# Patient Record
Sex: Female | Born: 1955
Health system: Southern US, Community
[De-identification: ages and names within clinical notes are randomized; demographics above are authoritative.]

## PROBLEM LIST (undated history)

## (undated) DIAGNOSIS — F329 Major depressive disorder, single episode, unspecified: Secondary | ICD-10-CM

## (undated) DIAGNOSIS — G4700 Insomnia, unspecified: Secondary | ICD-10-CM

## (undated) DIAGNOSIS — R7303 Prediabetes: Secondary | ICD-10-CM

## (undated) DIAGNOSIS — F419 Anxiety disorder, unspecified: Secondary | ICD-10-CM

## (undated) DIAGNOSIS — Z972 Presence of dental prosthetic device (complete) (partial): Secondary | ICD-10-CM

## (undated) DIAGNOSIS — U071 COVID-19: Secondary | ICD-10-CM

## (undated) DIAGNOSIS — D573 Sickle-cell trait: Secondary | ICD-10-CM

## (undated) DIAGNOSIS — E049 Nontoxic goiter, unspecified: Secondary | ICD-10-CM

## (undated) DIAGNOSIS — N183 Chronic kidney disease, stage 3 unspecified: Secondary | ICD-10-CM

## (undated) DIAGNOSIS — D126 Benign neoplasm of colon, unspecified: Secondary | ICD-10-CM

## (undated) DIAGNOSIS — Z973 Presence of spectacles and contact lenses: Secondary | ICD-10-CM

## (undated) DIAGNOSIS — D500 Iron deficiency anemia secondary to blood loss (chronic): Secondary | ICD-10-CM

## (undated) DIAGNOSIS — I10 Essential (primary) hypertension: Secondary | ICD-10-CM

## (undated) DIAGNOSIS — F32A Depression, unspecified: Secondary | ICD-10-CM

## (undated) DIAGNOSIS — D649 Anemia, unspecified: Secondary | ICD-10-CM

## (undated) HISTORY — DX: Iron deficiency anemia secondary to blood loss (chronic): D50.0

## (undated) HISTORY — DX: Sickle-cell trait: D57.3

## (undated) HISTORY — DX: COVID-19: U07.1

## (undated) HISTORY — DX: Depression, unspecified: F32.A

## (undated) HISTORY — PX: EYE SURGERY: SHX253

## (undated) HISTORY — PX: ABDOMINAL HYSTERECTOMY: SHX81

---
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
## 2005-06-17 ENCOUNTER — Ambulatory Visit: Payer: Self-pay

## 2005-07-05 ENCOUNTER — Ambulatory Visit: Payer: Self-pay

## 2006-10-15 ENCOUNTER — Ambulatory Visit: Payer: Self-pay

## 2008-02-03 ENCOUNTER — Ambulatory Visit: Payer: Self-pay

## 2009-03-02 ENCOUNTER — Ambulatory Visit: Payer: Self-pay | Admitting: Gastroenterology

## 2009-03-08 ENCOUNTER — Ambulatory Visit: Payer: Self-pay

## 2010-01-26 HISTORY — PX: ABDOMINAL HYSTERECTOMY: SHX81

## 2010-04-18 ENCOUNTER — Ambulatory Visit: Payer: Self-pay

## 2011-09-03 ENCOUNTER — Ambulatory Visit: Payer: Self-pay

## 2013-09-04 ENCOUNTER — Ambulatory Visit: Payer: Self-pay | Admitting: Internal Medicine

## 2013-09-04 LAB — COMPREHENSIVE METABOLIC PANEL
Albumin: 4.1 g/dL (ref 3.4–5.0)
Alkaline Phosphatase: 169 U/L — ABNORMAL HIGH (ref 50–136)
Anion Gap: 8 (ref 7–16)
BUN: 23 mg/dL — ABNORMAL HIGH (ref 7–18)
Bilirubin,Total: 0.4 mg/dL (ref 0.2–1.0)
Calcium, Total: 10 mg/dL (ref 8.5–10.1)
Chloride: 103 mmol/L (ref 98–107)
Co2: 31 mmol/L (ref 21–32)
Creatinine: 1.28 mg/dL (ref 0.60–1.30)
EGFR (African American): 54 — ABNORMAL LOW
EGFR (Non-African Amer.): 47 — ABNORMAL LOW
Glucose: 102 mg/dL — ABNORMAL HIGH (ref 65–99)
Osmolality: 287 (ref 275–301)
Potassium: 3.5 mmol/L (ref 3.5–5.1)
SGOT(AST): 19 U/L (ref 15–37)
SGPT (ALT): 28 U/L (ref 12–78)
Sodium: 142 mmol/L (ref 136–145)
Total Protein: 9.1 g/dL — ABNORMAL HIGH (ref 6.4–8.2)

## 2013-09-04 LAB — URINALYSIS, COMPLETE
Bilirubin,UR: NEGATIVE
Glucose,UR: NEGATIVE mg/dL (ref 0–75)
Ketone: NEGATIVE
Leukocyte Esterase: NEGATIVE
Nitrite: POSITIVE
Ph: 6 (ref 4.5–8.0)
Protein: NEGATIVE
Specific Gravity: 1.01 (ref 1.003–1.030)

## 2013-09-04 LAB — CBC WITH DIFFERENTIAL/PLATELET
Basophil #: 0.1 10*3/uL (ref 0.0–0.1)
Basophil %: 0.8 %
Eosinophil #: 0.5 10*3/uL (ref 0.0–0.7)
Eosinophil %: 6.5 %
HCT: 36.7 % (ref 35.0–47.0)
HGB: 12.2 g/dL (ref 12.0–16.0)
Lymphocyte #: 1.8 10*3/uL (ref 1.0–3.6)
Lymphocyte %: 26.1 %
MCH: 28.9 pg (ref 26.0–34.0)
MCHC: 33.4 g/dL (ref 32.0–36.0)
MCV: 87 fL (ref 80–100)
Monocyte #: 0.4 x10 3/mm (ref 0.2–0.9)
Monocyte %: 5.8 %
Neutrophil #: 4.2 10*3/uL (ref 1.4–6.5)
Neutrophil %: 60.8 %
Platelet: 302 10*3/uL (ref 150–440)
RBC: 4.24 10*6/uL (ref 3.80–5.20)
RDW: 14.6 % — ABNORMAL HIGH (ref 11.5–14.5)
WBC: 7 10*3/uL (ref 3.6–11.0)

## 2013-09-06 LAB — URINE CULTURE

## 2014-02-08 ENCOUNTER — Ambulatory Visit: Payer: Self-pay

## 2015-02-14 ENCOUNTER — Ambulatory Visit: Payer: Self-pay | Admitting: Family Medicine

## 2015-05-17 ENCOUNTER — Other Ambulatory Visit: Payer: Self-pay | Admitting: Family Medicine

## 2015-05-17 DIAGNOSIS — R2232 Localized swelling, mass and lump, left upper limb: Secondary | ICD-10-CM

## 2017-03-18 ENCOUNTER — Other Ambulatory Visit: Payer: Self-pay | Admitting: Family Medicine

## 2017-03-18 DIAGNOSIS — Z1239 Encounter for other screening for malignant neoplasm of breast: Secondary | ICD-10-CM

## 2017-07-07 ENCOUNTER — Other Ambulatory Visit: Payer: Self-pay | Admitting: Family Medicine

## 2017-07-09 ENCOUNTER — Other Ambulatory Visit: Payer: Self-pay | Admitting: Obstetrics

## 2017-07-09 DIAGNOSIS — Z1231 Encounter for screening mammogram for malignant neoplasm of breast: Secondary | ICD-10-CM

## 2017-07-11 ENCOUNTER — Other Ambulatory Visit: Payer: Self-pay | Admitting: Obstetrics

## 2017-07-11 DIAGNOSIS — E010 Iodine-deficiency related diffuse (endemic) goiter: Secondary | ICD-10-CM

## 2017-07-14 ENCOUNTER — Ambulatory Visit
Admission: RE | Admit: 2017-07-14 | Discharge: 2017-07-14 | Disposition: A | Discharge status: Home / Self Care | Payer: Federal, State, Local not specified - PPO | Source: Ambulatory Visit | Attending: Obstetrics | Admitting: Obstetrics

## 2017-07-14 ENCOUNTER — Encounter (INDEPENDENT_AMBULATORY_CARE_PROVIDER_SITE_OTHER): Payer: Self-pay

## 2017-07-14 DIAGNOSIS — Z1231 Encounter for screening mammogram for malignant neoplasm of breast: Secondary | ICD-10-CM | POA: Diagnosis present

## 2017-07-15 ENCOUNTER — Other Ambulatory Visit: Payer: Self-pay | Admitting: Obstetrics

## 2017-07-15 DIAGNOSIS — R928 Other abnormal and inconclusive findings on diagnostic imaging of breast: Secondary | ICD-10-CM

## 2017-07-15 DIAGNOSIS — N6310 Unspecified lump in the right breast, unspecified quadrant: Secondary | ICD-10-CM

## 2017-07-16 ENCOUNTER — Ambulatory Visit
Admission: RE | Admit: 2017-07-16 | Discharge: 2017-07-16 | Disposition: A | Discharge status: Home / Self Care | Payer: Federal, State, Local not specified - PPO | Source: Ambulatory Visit | Attending: Obstetrics | Admitting: Obstetrics

## 2017-07-16 DIAGNOSIS — E042 Nontoxic multinodular goiter: Secondary | ICD-10-CM | POA: Diagnosis not present

## 2017-07-16 DIAGNOSIS — E010 Iodine-deficiency related diffuse (endemic) goiter: Secondary | ICD-10-CM

## 2017-07-29 ENCOUNTER — Ambulatory Visit
Admission: RE | Admit: 2017-07-29 | Discharge: 2017-07-29 | Disposition: A | Discharge status: Home / Self Care | Payer: Federal, State, Local not specified - PPO | Source: Ambulatory Visit | Attending: Obstetrics | Admitting: Obstetrics

## 2017-07-29 DIAGNOSIS — R928 Other abnormal and inconclusive findings on diagnostic imaging of breast: Secondary | ICD-10-CM

## 2017-07-29 DIAGNOSIS — N6314 Unspecified lump in the right breast, lower inner quadrant: Secondary | ICD-10-CM | POA: Insufficient documentation

## 2017-07-29 DIAGNOSIS — N6310 Unspecified lump in the right breast, unspecified quadrant: Secondary | ICD-10-CM

## 2017-12-29 ENCOUNTER — Other Ambulatory Visit: Payer: Self-pay | Admitting: Family Medicine

## 2017-12-29 DIAGNOSIS — R928 Other abnormal and inconclusive findings on diagnostic imaging of breast: Secondary | ICD-10-CM

## 2018-01-30 ENCOUNTER — Ambulatory Visit
Admission: RE | Admit: 2018-01-30 | Discharge: 2018-01-30 | Disposition: A | Discharge status: Home / Self Care | Payer: Federal, State, Local not specified - PPO | Source: Ambulatory Visit | Attending: Family Medicine | Admitting: Family Medicine

## 2018-01-30 DIAGNOSIS — N6001 Solitary cyst of right breast: Secondary | ICD-10-CM | POA: Insufficient documentation

## 2018-01-30 DIAGNOSIS — R928 Other abnormal and inconclusive findings on diagnostic imaging of breast: Secondary | ICD-10-CM

## 2018-06-22 DIAGNOSIS — N390 Urinary tract infection, site not specified: Secondary | ICD-10-CM | POA: Diagnosis not present

## 2018-08-14 DIAGNOSIS — H10811 Pingueculitis, right eye: Secondary | ICD-10-CM | POA: Diagnosis not present

## 2018-08-31 DIAGNOSIS — R7989 Other specified abnormal findings of blood chemistry: Secondary | ICD-10-CM | POA: Diagnosis not present

## 2018-08-31 DIAGNOSIS — E7800 Pure hypercholesterolemia, unspecified: Secondary | ICD-10-CM | POA: Diagnosis not present

## 2018-08-31 DIAGNOSIS — I10 Essential (primary) hypertension: Secondary | ICD-10-CM | POA: Diagnosis not present

## 2018-08-31 DIAGNOSIS — E559 Vitamin D deficiency, unspecified: Secondary | ICD-10-CM | POA: Diagnosis not present

## 2018-08-31 DIAGNOSIS — Z713 Dietary counseling and surveillance: Secondary | ICD-10-CM | POA: Diagnosis not present

## 2018-08-31 DIAGNOSIS — R946 Abnormal results of thyroid function studies: Secondary | ICD-10-CM | POA: Diagnosis not present

## 2018-09-08 DIAGNOSIS — N183 Chronic kidney disease, stage 3 (moderate): Secondary | ICD-10-CM | POA: Diagnosis not present

## 2018-09-08 DIAGNOSIS — D649 Anemia, unspecified: Secondary | ICD-10-CM | POA: Diagnosis not present

## 2018-09-08 DIAGNOSIS — E7800 Pure hypercholesterolemia, unspecified: Secondary | ICD-10-CM | POA: Diagnosis not present

## 2018-09-08 DIAGNOSIS — I10 Essential (primary) hypertension: Secondary | ICD-10-CM | POA: Diagnosis not present

## 2018-09-11 DIAGNOSIS — Z713 Dietary counseling and surveillance: Secondary | ICD-10-CM | POA: Diagnosis not present

## 2018-09-11 DIAGNOSIS — Z131 Encounter for screening for diabetes mellitus: Secondary | ICD-10-CM | POA: Diagnosis not present

## 2018-09-14 DIAGNOSIS — E785 Hyperlipidemia, unspecified: Secondary | ICD-10-CM | POA: Diagnosis not present

## 2018-09-14 DIAGNOSIS — I10 Essential (primary) hypertension: Secondary | ICD-10-CM | POA: Diagnosis not present

## 2018-09-14 DIAGNOSIS — E669 Obesity, unspecified: Secondary | ICD-10-CM | POA: Diagnosis not present

## 2018-09-23 DIAGNOSIS — H2513 Age-related nuclear cataract, bilateral: Secondary | ICD-10-CM | POA: Diagnosis not present

## 2018-09-23 DIAGNOSIS — H11053 Peripheral pterygium, progressive, bilateral: Secondary | ICD-10-CM | POA: Diagnosis not present

## 2018-09-23 DIAGNOSIS — H52213 Irregular astigmatism, bilateral: Secondary | ICD-10-CM | POA: Diagnosis not present

## 2018-09-30 ENCOUNTER — Encounter: Payer: Self-pay | Admitting: Emergency Medicine

## 2018-09-30 ENCOUNTER — Ambulatory Visit
Admission: EM | Admit: 2018-09-30 | Discharge: 2018-09-30 | Disposition: A | Discharge status: Home / Self Care | Payer: Federal, State, Local not specified - PPO | Attending: Family Medicine | Admitting: Family Medicine

## 2018-09-30 ENCOUNTER — Other Ambulatory Visit: Payer: Self-pay

## 2018-09-30 DIAGNOSIS — J0100 Acute maxillary sinusitis, unspecified: Secondary | ICD-10-CM | POA: Diagnosis not present

## 2018-09-30 DIAGNOSIS — R05 Cough: Secondary | ICD-10-CM | POA: Diagnosis not present

## 2018-09-30 DIAGNOSIS — R059 Cough, unspecified: Secondary | ICD-10-CM

## 2018-09-30 HISTORY — DX: Essential (primary) hypertension: I10

## 2018-09-30 MED ORDER — AMOXICILLIN 875 MG PO TABS: 875.0000 mg | ORAL_TABLET | Freq: Two times a day (BID) | ORAL | 0 refills | Status: DC

## 2018-09-30 MED ORDER — HYDROCOD POLST-CPM POLST ER 10-8 MG/5ML PO SUER: 5.0000 mL | Freq: Every evening | ORAL | 0 refills | Status: DC | PRN

## 2018-09-30 NOTE — ED Triage Notes (Signed)
Patient c/o cough and chest congestion for over a week. Patient reports HA today.

## 2018-09-30 NOTE — ED Provider Notes (Signed)
[facility]    CSN: [id number] Arrival date & time: 09/30/18  1457     History   Chief Complaint Chief Complaint  Patient presents with  . Cough  . Headache    HPI Kendra Rhodes is a 62 y.o. female.   The history is provided by the patient.  Cough  Associated symptoms: headaches   Associated symptoms: no wheezing   Headache  Associated symptoms: congestion, cough, fatigue and URI   URI  Presenting symptoms: congestion, cough and fatigue   Severity:  Moderate Onset quality:  Sudden Duration:  10 days Timing:  Constant Progression:  Worsening Chronicity:  New Relieved by:  Nothing Ineffective treatments:  OTC medications Associated symptoms: headaches   Associated symptoms: no wheezing   Risk factors: sick contacts   Risk factors: not elderly, no chronic cardiac disease, no chronic kidney disease, no chronic respiratory disease, no diabetes mellitus, no immunosuppression, no recent illness and no recent travel     Past Medical History:  Diagnosis Date  . Hypertension     There are no active problems to display for this patient.   Past Surgical History:  Procedure Laterality Date  . ABDOMINAL HYSTERECTOMY      OB History   None      Home Medications    Prior to Admission medications   Medication Sig Start Date End Date Taking? Authorizing Provider  atenolol (TENORMIN) 25 MG tablet atenolol 25 mg tablet 01/10/16  Yes [provider]  atorvastatin (LIPITOR) 40 MG tablet  12/03/17  Yes [provider]  azelastine (ASTELIN) 0.1 % nasal spray Place into the nose. 01/30/16  Yes [provider]  hydrochlorothiazide (HYDRODIURIL) 25 MG tablet hydrochlorothiazide 25 mg tablet 02/19/16  Yes [provider]  lisinopril (PRINIVIL,ZESTRIL) 10 MG tablet lisinopril 10 mg tablet 12/03/17  Yes [provider]  montelukast (SINGULAIR) 10 MG tablet montelukast 10 mg tablet   Yes [provider]  TRULANCE 3  MG TABS  09/10/18  Yes [provider]  amoxicillin (AMOXIL) 875 MG tablet Take 1 tablet (875 mg total) by mouth 2 (two) times daily. 09/30/18   Norval Gable, MD  chlorpheniramine-HYDROcodone (TUSSIONEX PENNKINETIC ER) 10-8 MG/5ML SUER Take 5 mLs by mouth at bedtime as needed. 09/30/18   Norval Gable, MD  citalopram (CELEXA) 20 MG tablet citalopram 20 mg tablet    [provider]    Family History Family History  Problem Relation Age of Onset  . Breast cancer Mother 29    Social History Social History   Tobacco Use  . Smoking status: Never Smoker  . Smokeless tobacco: Never Used  Substance Use Topics  . Alcohol use: Never    Frequency: Never  . Drug use: Never     Allergies   Aspirin and Sulfa antibiotics   Review of Systems Review of Systems  Constitutional: Positive for fatigue.  HENT: Positive for congestion.   Respiratory: Positive for cough. Negative for wheezing.   Neurological: Positive for headaches.     Physical Exam Triage Vital Signs ED Triage Vitals  Enc Vitals Group     BP 09/30/18 1513 123/88     Pulse Rate 09/30/18 1513 100     Resp 09/30/18 1513 16     Temp 09/30/18 1513 98.6 F (37 C)     Temp Source 09/30/18 1513 Oral     SpO2 09/30/18 1513 99 %     Weight 09/30/18 1507 188 lb (85.3 kg)  Height 09/30/18 1507 5\' 6"  (1.676 m)     Head Circumference --      Peak Flow --      Pain Score 09/30/18 1507 6     Pain Loc --      Pain Edu? --      Excl. in [place]? --    No data found.  Updated Vital Signs BP 123/88 (BP Location: Left Arm)   Pulse 100   Temp 98.6 F (37 C) (Oral)   Resp 16   Ht 5\' 6"  (1.676 m)   Wt 85.3 kg   SpO2 99%   BMI 30.34 kg/m   Visual Acuity Right Eye Distance:   Left Eye Distance:   Bilateral Distance:    Right Eye Near:   Left Eye Near:    Bilateral Near:     Physical Exam  Constitutional: She appears well-developed and well-nourished. No distress.  HENT:  Head: Normocephalic and  atraumatic.  Right Ear: Tympanic membrane, external ear and ear canal normal.  Left Ear: Tympanic membrane, external ear and ear canal normal.  Nose: Mucosal edema and rhinorrhea present. No nose lacerations, sinus tenderness, nasal deformity, septal deviation or nasal septal hematoma. No epistaxis.  No foreign bodies. Right sinus exhibits maxillary sinus tenderness and frontal sinus tenderness. Left sinus exhibits maxillary sinus tenderness and frontal sinus tenderness.  Mouth/Throat: Uvula is midline, oropharynx is clear and moist and mucous membranes are normal. No oropharyngeal exudate.  Eyes: Pupils are equal, round, and reactive to light. Conjunctivae and EOM are normal. Right eye exhibits no discharge. Left eye exhibits no discharge. No scleral icterus.  Neck: Normal range of motion. Neck supple. No thyromegaly present.  Cardiovascular: Normal rate, regular rhythm and normal heart sounds.  Pulmonary/Chest: Effort normal and breath sounds normal. No stridor. No respiratory distress. She has no wheezes. She has no rales.  Lymphadenopathy:    She has no cervical adenopathy.  Skin: She is not diaphoretic.  Nursing note and vitals reviewed.    UC Treatments / Results  Labs (all labs ordered are listed, but only abnormal results are displayed) Labs Reviewed - No data to display  EKG None  Radiology No results found.  Procedures Procedures (including critical care time)  Medications Ordered in UC Medications - No data to display  Initial Impression / Assessment and Plan / UC Course  I have reviewed the triage vital signs and the nursing notes.  Pertinent labs & imaging results that were available during my care of the patient were reviewed by me and considered in my medical decision making (see chart for details).      Final Clinical Impressions(s) / UC Diagnoses   Final diagnoses:  Acute maxillary sinusitis, recurrence not specified  Cough     Discharge Instructions      Over the counter flonase nasal spray    ED Prescriptions    Medication Sig Dispense Auth. Provider   amoxicillin (AMOXIL) 875 MG tablet Take 1 tablet (875 mg total) by mouth 2 (two) times daily. 20 tablet Norval Gable, MD   chlorpheniramine-HYDROcodone (TUSSIONEX PENNKINETIC ER) 10-8 MG/5ML SUER Take 5 mLs by mouth at bedtime as needed. 60 mL Norval Gable, MD     1. diagnosis reviewed with patient 2. rx as per orders above; reviewed possible side effects, interactions, risks and benefits  3. Recommend supportive treatment as above  4. Follow-up prn if symptoms worsen or don't improve   Controlled Substance Prescriptions [place] Controlled Substance Registry consulted? Not Applicable  Norval Gable, MD 09/30/18 [phone]

## 2018-09-30 NOTE — Discharge Instructions (Signed)
Over the counter flonase nasal spray

## 2018-10-05 DIAGNOSIS — Z713 Dietary counseling and surveillance: Secondary | ICD-10-CM | POA: Diagnosis not present

## 2018-10-12 DIAGNOSIS — H11001 Unspecified pterygium of right eye: Secondary | ICD-10-CM | POA: Diagnosis not present

## 2018-10-12 DIAGNOSIS — H11051 Peripheral pterygium, progressive, right eye: Secondary | ICD-10-CM | POA: Diagnosis not present

## 2018-10-12 DIAGNOSIS — Z882 Allergy status to sulfonamides status: Secondary | ICD-10-CM | POA: Diagnosis not present

## 2018-10-12 DIAGNOSIS — H11053 Peripheral pterygium, progressive, bilateral: Secondary | ICD-10-CM | POA: Diagnosis not present

## 2018-10-12 DIAGNOSIS — I10 Essential (primary) hypertension: Secondary | ICD-10-CM | POA: Diagnosis not present

## 2018-10-12 DIAGNOSIS — Z888 Allergy status to other drugs, medicaments and biological substances status: Secondary | ICD-10-CM | POA: Diagnosis not present

## 2018-10-12 DIAGNOSIS — D573 Sickle-cell trait: Secondary | ICD-10-CM | POA: Diagnosis not present

## 2018-10-12 DIAGNOSIS — Z79899 Other long term (current) drug therapy: Secondary | ICD-10-CM | POA: Diagnosis not present

## 2018-10-20 DIAGNOSIS — Z713 Dietary counseling and surveillance: Secondary | ICD-10-CM | POA: Diagnosis not present

## 2018-12-02 DIAGNOSIS — H11053 Peripheral pterygium, progressive, bilateral: Secondary | ICD-10-CM | POA: Diagnosis not present

## 2018-12-02 DIAGNOSIS — H2513 Age-related nuclear cataract, bilateral: Secondary | ICD-10-CM | POA: Diagnosis not present

## 2018-12-25 DIAGNOSIS — E7800 Pure hypercholesterolemia, unspecified: Secondary | ICD-10-CM | POA: Diagnosis not present

## 2018-12-25 DIAGNOSIS — D649 Anemia, unspecified: Secondary | ICD-10-CM | POA: Diagnosis not present

## 2018-12-25 DIAGNOSIS — R7989 Other specified abnormal findings of blood chemistry: Secondary | ICD-10-CM | POA: Diagnosis not present

## 2018-12-25 DIAGNOSIS — I10 Essential (primary) hypertension: Secondary | ICD-10-CM | POA: Diagnosis not present

## 2018-12-25 DIAGNOSIS — E559 Vitamin D deficiency, unspecified: Secondary | ICD-10-CM | POA: Diagnosis not present

## 2018-12-28 DIAGNOSIS — I10 Essential (primary) hypertension: Secondary | ICD-10-CM | POA: Diagnosis not present

## 2018-12-28 DIAGNOSIS — E7800 Pure hypercholesterolemia, unspecified: Secondary | ICD-10-CM | POA: Diagnosis not present

## 2018-12-28 DIAGNOSIS — N183 Chronic kidney disease, stage 3 (moderate): Secondary | ICD-10-CM | POA: Diagnosis not present

## 2018-12-28 DIAGNOSIS — D649 Anemia, unspecified: Secondary | ICD-10-CM | POA: Diagnosis not present

## 2018-12-31 ENCOUNTER — Encounter: Payer: Self-pay | Admitting: *Deleted

## 2019-01-05 ENCOUNTER — Other Ambulatory Visit: Payer: Self-pay

## 2019-01-05 ENCOUNTER — Ambulatory Visit
Admission: EM | Admit: 2019-01-05 | Discharge: 2019-01-05 | Disposition: A | Discharge status: Home / Self Care | Payer: Federal, State, Local not specified - PPO | Attending: Family Medicine | Admitting: Family Medicine

## 2019-01-05 ENCOUNTER — Ambulatory Visit (INDEPENDENT_AMBULATORY_CARE_PROVIDER_SITE_OTHER): Payer: Federal, State, Local not specified - PPO

## 2019-01-05 ENCOUNTER — Encounter: Payer: Self-pay | Admitting: Emergency Medicine

## 2019-01-05 DIAGNOSIS — S29019A Strain of muscle and tendon of unspecified wall of thorax, initial encounter: Secondary | ICD-10-CM

## 2019-01-05 DIAGNOSIS — M47894 Other spondylosis, thoracic region: Secondary | ICD-10-CM | POA: Diagnosis not present

## 2019-01-05 DIAGNOSIS — M546 Pain in thoracic spine: Secondary | ICD-10-CM | POA: Diagnosis not present

## 2019-01-05 MED ORDER — CYCLOBENZAPRINE HCL 10 MG PO TABS: 10.0000 mg | ORAL_TABLET | Freq: Every evening | ORAL | 0 refills | Status: DC | PRN

## 2019-01-05 MED ORDER — MELOXICAM 15 MG PO TABS: 15.0000 mg | ORAL_TABLET | Freq: Every day | ORAL | 0 refills | Status: DC | PRN

## 2019-01-05 MED ORDER — OXYCODONE-ACETAMINOPHEN 5-325 MG PO TABS: 1.0000 | ORAL_TABLET | Freq: Four times a day (QID) | ORAL | 0 refills | Status: DC | PRN
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
## 2019-01-05 NOTE — Discharge Instructions (Addendum)
Take medication as prescribed. Rest. Drink plenty of fluids. Stretch. Ice/heat.   Follow up with your primary care physician this week as needed. Return to Urgent care for new or worsening concerns.
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
## 2019-02-08 ENCOUNTER — Ambulatory Visit: Payer: Federal, State, Local not specified - PPO | Admitting: Gastroenterology

## 2019-02-24 ENCOUNTER — Encounter: Payer: Self-pay | Admitting: Gastroenterology

## 2019-02-24 ENCOUNTER — Other Ambulatory Visit: Payer: Self-pay

## 2019-02-24 ENCOUNTER — Ambulatory Visit: Payer: Federal, State, Local not specified - PPO | Admitting: Gastroenterology

## 2019-02-24 VITALS — BP 126/80 | HR 81 | Ht 66.0 in | Wt 191.0 lb

## 2019-02-24 DIAGNOSIS — M7550 Bursitis of unspecified shoulder: Secondary | ICD-10-CM | POA: Insufficient documentation

## 2019-02-24 DIAGNOSIS — D649 Anemia, unspecified: Secondary | ICD-10-CM | POA: Diagnosis not present

## 2019-02-24 DIAGNOSIS — M25519 Pain in unspecified shoulder: Secondary | ICD-10-CM | POA: Insufficient documentation

## 2019-02-24 NOTE — Progress Notes (Signed)
Gastroenterology Consultation  Referring Provider:     Elwanda Brooklyn, MD Primary Care Physician:  Elwanda Brooklyn, MD Primary Gastroenterologist:  Dr. Allen Norris     Reason for Consultation:     Anemia        HPI:   Kendra Rhodes is a 63 y.o. y/o female referred for consultation & management of Anemia by Dr. Redmond Pulling, Raina Mina, MD.  This patient comes in today with a history of anemia.  The patient states that she has been told in the past that she has had anemia and has taken iron off and on.  The patient's most recent lab work shows her iron, iron saturation and ferritin to be normal but her TIBC to be high.  She denies any black stools or bloody stools.  She also denies any abdominal pain.  The patient has had lab work that has shown a chronically elevated alkaline phosphatase.  She does not seem to have been aware of this and states that it has never been worked up.  There is no report of any unexplained weight loss fevers chills nausea or vomiting.  The patient reports that her last colonoscopy was done in 2010 and it was done by me at that time.  Past Medical History:  Diagnosis Date  . Hypertension     Past Surgical History:  Procedure Laterality Date  . ABDOMINAL HYSTERECTOMY    . EYE SURGERY      Prior to Admission medications   Medication Sig Start Date End Date Taking? Authorizing Provider  atenolol (TENORMIN) 25 MG tablet atenolol 25 mg tablet 01/10/16   [provider]  atorvastatin (LIPITOR) 40 MG tablet  12/03/17   [provider]  azelastine (ASTELIN) 0.1 % nasal spray Place into the nose. 01/30/16   [provider]  BELSOMRA 20 MG TABS  09/08/18   [provider]  buPROPion (WELLBUTRIN XL) 150 MG 24 hr tablet bupropion HCl XL 150 mg 24 hr tablet, extended release    [provider]  Cetirizine HCl 10 MG CAPS Take by mouth.    [provider]  citalopram (CELEXA) 20 MG tablet citalopram 20 mg tablet    [provider]  cyclobenzaprine (FLEXERIL) 10 MG tablet Take 1 tablet (10 mg total) by mouth at bedtime as needed for muscle spasms. Do not drive while taking as can cause drowsiness 01/05/19   Marylene Land, NP  ezetimibe (ZETIA) 10 MG tablet  12/30/18   [provider]  fluticasone (FLONASE) 50 MCG/ACT nasal spray fluticasone propionate 50 mcg/actuation nasal spray,suspension    [provider]  hydrochlorothiazide (HYDRODIURIL) 25 MG tablet hydrochlorothiazide 25 mg tablet 02/19/16   [provider]  HYDROcodone-acetaminophen (NORCO/VICODIN) 5-325 MG tablet hydrocodone 5 mg-acetaminophen 325 mg tablet    [provider]  lisinopril (PRINIVIL,ZESTRIL) 10 MG tablet lisinopril 10 mg tablet 12/03/17   [provider]  Loteprednol-Tobramycin 0.5-0.3 % SUSP Zylet 0.3 %-0.5 % eye drops,suspension    [provider]  meloxicam (MOBIC) 15 MG tablet Take 1 tablet (15 mg total) by mouth daily as needed. 01/05/19   Marylene Land, NP  montelukast (SINGULAIR) 10 MG tablet montelukast 10 mg tablet    [provider]  mupirocin cream (BACTROBAN) 2 % Apply topically. 02/20/16   [provider]  oxyCODONE-acetaminophen (PERCOCET/ROXICET) 5-325 MG tablet Take 1 tablet by mouth every 6 (six) hours as needed for severe pain. Do not drive while taking as can cause drowsiness.  01/05/19   Marylene Land, NP  traMADol Veatrice Bourbon) 50 MG tablet tramadol 50 mg tablet    [provider]  TRULANCE 3 MG TABS  09/10/18   [provider]  Vitamin D, Ergocalciferol, (DRISDOL) 1.25 MG (50000 UT) CAPS capsule Take by mouth. 01/30/16   [provider]    Family History  Problem Relation Age of Onset  . Breast cancer Mother 46     Social History   Tobacco Use  . Smoking status: Never Smoker  . Smokeless tobacco: Never Used  Substance Use Topics  . Alcohol use: Never    Frequency: Never  . Drug use: Never    Allergies as of  02/24/2019 - Review Complete 01/05/2019  Allergen Reaction Noted  . Aspirin Other (See Comments) 09/30/2018  . Strawberry extract  02/24/2019  . Sulfa antibiotics Other (See Comments), Itching, and Swelling 03/25/2016  . Wheat bran  02/24/2019  . Prednisone Palpitations 03/25/2016    Review of Systems:    All systems reviewed and negative except where noted in HPI.   Physical Exam:  There were no vitals taken for this visit. No LMP recorded. Patient has had a hysterectomy. General:   Alert,  Well-developed, well-nourished, pleasant and cooperative in NAD Head:  Normocephalic and atraumatic. Eyes:  Sclera clear, no icterus.   Conjunctiva pink. Ears:  Normal auditory acuity. Nose:  No deformity, discharge, or lesions. Mouth:  No deformity or lesions,oropharynx pink & moist. Neck:  Supple; no masses or thyromegaly. Lungs:  Respirations even and unlabored.  Clear throughout to auscultation.   No wheezes, crackles, or rhonchi. No acute distress. Heart:  Regular rate and rhythm; no murmurs, clicks, rubs, or gallops. Abdomen:  Normal bowel sounds.  No bruits.  Soft, non-tender and non-distended without masses, hepatosplenomegaly or hernias noted.  No guarding or rebound tenderness.  Negative Carnett sign.   Rectal:  Deferred.  Msk:  Symmetrical without gross deformities.  Good, equal movement & strength bilaterally. Pulses:  Normal pulses noted. Extremities:  No clubbing or edema.  No cyanosis. Neurologic:  Alert and oriented x3;  grossly normal neurologically. Skin:  Intact without significant lesions or rashes.  No jaundice. Lymph Nodes:  No significant cervical adenopathy. Psych:  Alert and cooperative. Normal mood and affect.  Imaging Studies: No results found.  Assessment and Plan:   Kendra Rhodes is a 63 y.o. y/o female who comes in today with a history of Tissue-standing anemia.  The patient's iron studies show a increased TIBC with otherwise normal iron studies but the patient  states that she has been intermittently on iron. Because of this the patient will be set up for an EGD and colonoscopy to look for source of her anemia. I have discussed risks & benefits which include, but are not limited to, bleeding, infection, perforation & drug reaction.  The patient agrees with this plan & written consent will be obtained.     Lucilla Lame, MD. Marval Regal    Note: This dictation was prepared with Dragon dictation along with smaller phrase technology. Any transcriptional errors that result from this process are unintentional.

## 2019-05-27 ENCOUNTER — Other Ambulatory Visit: Payer: Self-pay

## 2019-05-27 ENCOUNTER — Telehealth: Payer: Self-pay | Admitting: Gastroenterology

## 2019-05-27 DIAGNOSIS — D649 Anemia, unspecified: Secondary | ICD-10-CM

## 2019-05-27 NOTE — Telephone Encounter (Signed)
Pt left vm she was scheduled for procedure before Covid19 and would like  to reschedule this please call pt

## 2019-05-27 NOTE — Telephone Encounter (Signed)
Pt has been scheduled for a colonoscopy and EGD with [facility] at [facility] on 06/10/19.

## 2019-06-07 ENCOUNTER — Encounter: Payer: Self-pay | Admitting: *Deleted

## 2019-06-07 ENCOUNTER — Other Ambulatory Visit: Payer: Self-pay

## 2019-06-07 ENCOUNTER — Encounter
Admission: RE | Admit: 2019-06-07 | Discharge: 2019-06-07 | Disposition: A | Discharge status: Home / Self Care | Payer: Federal, State, Local not specified - PPO | Source: Ambulatory Visit | Attending: Gastroenterology | Admitting: Gastroenterology

## 2019-06-07 DIAGNOSIS — Z01812 Encounter for preprocedural laboratory examination: Secondary | ICD-10-CM | POA: Insufficient documentation

## 2019-06-07 DIAGNOSIS — D649 Anemia, unspecified: Secondary | ICD-10-CM | POA: Diagnosis not present

## 2019-06-07 DIAGNOSIS — Z1159 Encounter for screening for other viral diseases: Secondary | ICD-10-CM | POA: Insufficient documentation

## 2019-06-08 LAB — NOVEL CORONAVIRUS, NAA (HOSP ORDER, SEND-OUT TO REF LAB; TAT 18-24 HRS): SARS-CoV-2, NAA: NOT DETECTED

## 2019-06-09 ENCOUNTER — Telehealth: Payer: Self-pay | Admitting: Gastroenterology

## 2019-06-09 DIAGNOSIS — Z01419 Encounter for gynecological examination (general) (routine) without abnormal findings: Secondary | ICD-10-CM | POA: Diagnosis not present

## 2019-06-09 DIAGNOSIS — Z1231 Encounter for screening mammogram for malignant neoplasm of breast: Secondary | ICD-10-CM | POA: Diagnosis not present

## 2019-06-09 DIAGNOSIS — Z1211 Encounter for screening for malignant neoplasm of colon: Secondary | ICD-10-CM | POA: Diagnosis not present

## 2019-06-09 DIAGNOSIS — Z789 Other specified health status: Secondary | ICD-10-CM | POA: Diagnosis not present

## 2019-06-09 LAB — ALKALINE PHOSPHATASE, ISOENZYMES
Alkaline Phosphatase: 134 IU/L — ABNORMAL HIGH (ref 39–117)
BONE FRACTION: 23 % (ref 14–68)
INTESTINAL FRAC.: 4 % (ref 0–18)
LIVER FRACTION: 73 % (ref 18–85)

## 2019-06-09 LAB — MITOCHONDRIAL ANTIBODIES: Mitochondrial Ab: <20 Units (ref 0.0–20.0)

## 2019-06-09 NOTE — Telephone Encounter (Signed)
Pt left vm she has a procedure tomorrow and has not received a call regarding her time she is concerned please call pt

## 2019-06-09 NOTE — Telephone Encounter (Signed)
Procedure time has been provided by [facility].  Thanks [employer]

## 2019-06-09 NOTE — Discharge Instructions (Signed)
General Anesthesia, Adult, Care After  This sheet gives you information about how to care for yourself after your procedure. Your health care provider may also give you more specific instructions. If you have problems or questions, contact your health care provider.  What can I expect after the procedure?  After the procedure, the following side effects are common:  Pain or discomfort at the IV site.  Nausea.  Vomiting.  Sore throat.  Trouble concentrating.  Feeling cold or chills.  Weak or tired.  Sleepiness and fatigue.  Soreness and body aches. These side effects can affect parts of the body that were not involved in surgery.  Follow these instructions at home:    For at least 24 hours after the procedure:  Have a responsible adult stay with you. It is important to have someone help care for you until you are awake and alert.  Rest as needed.  Do not:  Participate in activities in which you could fall or become injured.  Drive.  Use heavy machinery.  Drink alcohol.  Take sleeping pills or medicines that cause drowsiness.  Make important decisions or sign legal documents.  Take care of children on your own.  Eating and drinking  Follow any instructions from your health care provider about eating or drinking restrictions.  When you feel hungry, start by eating small amounts of foods that are soft and easy to digest (bland), such as toast. Gradually return to your regular diet.  Drink enough fluid to keep your urine pale yellow.  If you vomit, rehydrate by drinking water, juice, or clear broth.  General instructions  If you have sleep apnea, surgery and certain medicines can increase your risk for breathing problems. Follow instructions from your health care provider about wearing your sleep device:  Anytime you are sleeping, including during daytime naps.  While taking prescription pain medicines, sleeping medicines, or medicines that make you drowsy.  Return to your normal activities as told by your health care  provider. Ask your health care provider what activities are safe for you.  Take over-the-counter and prescription medicines only as told by your health care provider.  If you smoke, do not smoke without supervision.  Keep all follow-up visits as told by your health care provider. This is important.  Contact a health care provider if:  You have nausea or vomiting that does not get better with medicine.  You cannot eat or drink without vomiting.  You have pain that does not get better with medicine.  You are unable to pass urine.  You develop a skin rash.  You have a fever.  You have redness around your IV site that gets worse.  Get help right away if:  You have difficulty breathing.  You have chest pain.  You have blood in your urine or stool, or you vomit blood.  Summary  After the procedure, it is common to have a sore throat or nausea. It is also common to feel tired.  Have a responsible adult stay with you for the first 24 hours after general anesthesia. It is important to have someone help care for you until you are awake and alert.  When you feel hungry, start by eating small amounts of foods that are soft and easy to digest (bland), such as toast. Gradually return to your regular diet.  Drink enough fluid to keep your urine pale yellow.  Return to your normal activities as told by your health care provider. Ask your health care   provider what activities are safe for you.  This information is not intended to replace advice given to you by your health care provider. Make sure you discuss any questions you have with your health care provider.  Document Released: 03/10/2001 Document Revised: 07/18/2017 Document Reviewed: 07/18/2017  Elsevier Interactive Patient Education  2019 [employer]vier Inc[employer]

## 2019-06-10 ENCOUNTER — Ambulatory Visit
Admission: RE | Admit: 2019-06-10 | Discharge: 2019-06-10 | Disposition: A | Discharge status: Home / Self Care | Payer: Federal, State, Local not specified - PPO | Attending: Gastroenterology | Admitting: Gastroenterology

## 2019-06-10 ENCOUNTER — Other Ambulatory Visit: Payer: Self-pay

## 2019-06-10 ENCOUNTER — Ambulatory Visit: Payer: Federal, State, Local not specified - PPO | Admitting: Anesthesiology

## 2019-06-10 ENCOUNTER — Encounter
Admission: RE | Disposition: A | Discharge status: Home / Self Care | Payer: Self-pay | Source: Home / Self Care | Attending: Gastroenterology

## 2019-06-10 DIAGNOSIS — D500 Iron deficiency anemia secondary to blood loss (chronic): Secondary | ICD-10-CM

## 2019-06-10 DIAGNOSIS — K641 Second degree hemorrhoids: Secondary | ICD-10-CM | POA: Diagnosis not present

## 2019-06-10 DIAGNOSIS — D509 Iron deficiency anemia, unspecified: Secondary | ICD-10-CM | POA: Diagnosis not present

## 2019-06-10 DIAGNOSIS — K635 Polyp of colon: Secondary | ICD-10-CM

## 2019-06-10 DIAGNOSIS — K2900 Acute gastritis without bleeding: Secondary | ICD-10-CM | POA: Diagnosis not present

## 2019-06-10 DIAGNOSIS — Z79891 Long term (current) use of opiate analgesic: Secondary | ICD-10-CM | POA: Diagnosis not present

## 2019-06-10 DIAGNOSIS — Z79899 Other long term (current) drug therapy: Secondary | ICD-10-CM | POA: Insufficient documentation

## 2019-06-10 DIAGNOSIS — D508 Other iron deficiency anemias: Secondary | ICD-10-CM | POA: Diagnosis not present

## 2019-06-10 DIAGNOSIS — K2950 Unspecified chronic gastritis without bleeding: Secondary | ICD-10-CM | POA: Diagnosis not present

## 2019-06-10 DIAGNOSIS — I10 Essential (primary) hypertension: Secondary | ICD-10-CM | POA: Insufficient documentation

## 2019-06-10 DIAGNOSIS — D125 Benign neoplasm of sigmoid colon: Secondary | ICD-10-CM

## 2019-06-10 DIAGNOSIS — Z6829 Body mass index (BMI) 29.0-29.9, adult: Secondary | ICD-10-CM | POA: Diagnosis not present

## 2019-06-10 DIAGNOSIS — K2930 Chronic superficial gastritis without bleeding: Secondary | ICD-10-CM | POA: Diagnosis not present

## 2019-06-10 DIAGNOSIS — D649 Anemia, unspecified: Secondary | ICD-10-CM

## 2019-06-10 HISTORY — PX: COLONOSCOPY WITH PROPOFOL: SHX5780

## 2019-06-10 HISTORY — DX: Presence of dental prosthetic device (complete) (partial): Z97.2

## 2019-06-10 HISTORY — PX: POLYPECTOMY: SHX5525

## 2019-06-10 HISTORY — DX: Presence of spectacles and contact lenses: Z97.3

## 2019-06-10 HISTORY — PX: ESOPHAGOGASTRODUODENOSCOPY (EGD) WITH PROPOFOL: SHX5813

## 2019-06-10 SURGERY — COLONOSCOPY WITH PROPOFOL
Anesthesia: General

## 2019-06-10 MED ORDER — PROPOFOL 10 MG/ML IV BOLUS
INTRAVENOUS | Status: DC | PRN
  Administered 2019-06-10 (×3): 100 mg via INTRAVENOUS

## 2019-06-10 MED ORDER — SODIUM CHLORIDE 0.9 % IV SOLN: INTRAVENOUS | Status: DC

## 2019-06-10 MED ORDER — GLYCOPYRROLATE 0.2 MG/ML IJ SOLN
INTRAMUSCULAR | Status: DC | PRN
  Administered 2019-06-10: 0.2 mg via INTRAVENOUS

## 2019-06-10 MED ORDER — LACTATED RINGERS IV SOLN: INTRAVENOUS | Status: DC

## 2019-06-10 MED ORDER — ONDANSETRON HCL 4 MG/2ML IJ SOLN: 4.0000 mg | Freq: Once | INTRAMUSCULAR | Status: DC | PRN

## 2019-06-10 MED ORDER — LIDOCAINE HCL (CARDIAC) PF 100 MG/5ML IV SOSY
PREFILLED_SYRINGE | INTRAVENOUS | Status: DC | PRN
  Administered 2019-06-10: 50 mg via INTRAVENOUS

## 2019-06-10 MED ORDER — STERILE WATER FOR IRRIGATION IR SOLN
Status: DC | PRN
  Administered 2019-06-10: 5 mL

## 2019-06-10 SURGICAL SUPPLY — 36 items
BALLN DILATOR 10-12 8 (BALLOONS)
BALLN DILATOR 12-15 8 (BALLOONS)
BALLN DILATOR 15-18 8 (BALLOONS)
BALLN DILATOR CRE 0-12 8 (BALLOONS)
BALLN DILATOR ESOPH 8 10 CRE (MISCELLANEOUS) IMPLANT
BALLOON DILATOR 12-15 8 (BALLOONS) IMPLANT
BALLOON DILATOR 15-18 8 (BALLOONS) IMPLANT
BALLOON DILATOR CRE 0-12 8 (BALLOONS) IMPLANT
BLOCK BITE 60FR ADLT L/F GRN (MISCELLANEOUS) ×3 IMPLANT
CANISTER SUCT 1200ML W/VALVE (MISCELLANEOUS) ×3 IMPLANT
CLIP HMST 235XBRD CATH ROT (MISCELLANEOUS) IMPLANT
CLIP RESOLUTION 360 11X235 (MISCELLANEOUS)
ELECT REM PT RETURN 9FT ADLT (ELECTROSURGICAL)
ELECTRODE REM PT RTRN 9FT ADLT (ELECTROSURGICAL) IMPLANT
FCP ESCP3.2XJMB 240X2.8X (MISCELLANEOUS) ×2
FORCEPS BIOP RAD 4 LRG CAP 4 (CUTTING FORCEPS) IMPLANT
FORCEPS BIOP RJ4 240 W/NDL (MISCELLANEOUS) ×1
FORCEPS ESCP3.2XJMB 240X2.8X (MISCELLANEOUS) IMPLANT
GOWN CVR UNV OPN BCK APRN NK (MISCELLANEOUS) ×4 IMPLANT
GOWN ISOL THUMB LOOP REG UNIV (MISCELLANEOUS) ×2
INJECTOR VARIJECT VIN23 (MISCELLANEOUS) IMPLANT
KIT DEFENDO VALVE AND CONN (KITS) IMPLANT
KIT ENDO PROCEDURE OLY (KITS) ×3 IMPLANT
MARKER SPOT ENDO TATTOO 5ML (MISCELLANEOUS) IMPLANT
PROBE APC STR FIRE (PROBE) IMPLANT
RETRIEVER NET PLAT FOOD (MISCELLANEOUS) IMPLANT
RETRIEVER NET ROTH 2.5X230 LF (MISCELLANEOUS) IMPLANT
SNARE SHORT THROW 13M SML OVAL (MISCELLANEOUS) IMPLANT
SNARE SHORT THROW 30M LRG OVAL (MISCELLANEOUS) IMPLANT
SNARE SNG USE RND 15MM (INSTRUMENTS) IMPLANT
SPOT EX ENDOSCOPIC TATTOO (MISCELLANEOUS)
SYR INFLATION 60ML (SYRINGE) IMPLANT
TRAP ETRAP POLY (MISCELLANEOUS) IMPLANT
VARIJECT INJECTOR VIN23 (MISCELLANEOUS)
WATER STERILE IRR 250ML POUR (IV SOLUTION) ×3 IMPLANT
WIRE CRE 18-20MM 8CM F G (MISCELLANEOUS) IMPLANT

## 2019-06-10 NOTE — Op Note (Signed)
[facility] Gastroenterology Patient Name: Kendra Rhodes Procedure Date: 06/10/2019 9:27 AM MRN: [id number] Account #: [id number] Date of Birth: 1956-01-12 Admit Type: Outpatient Age: 63 Room: [facility] OR ROOM 01 Gender: Female Note Status: Finalized Procedure:            Colonoscopy Indications:          Iron deficiency anemia Providers:            Lucilla Lame MD, MD Medicines:            Propofol per Anesthesia Complications:        No immediate complications. Procedure:            Pre-Anesthesia Assessment:                       - Prior to the procedure, a History and Physical was                        performed, and patient medications and allergies were                        reviewed. The patient's tolerance of previous                        anesthesia was also reviewed. The risks and benefits of                        the procedure and the sedation options and risks were                        discussed with the patient. All questions were                        answered, and informed consent was obtained. Prior                        Anticoagulants: The patient has taken no previous                        anticoagulant or antiplatelet agents. ASA Grade                        Assessment: II - A patient with mild systemic disease.                        After reviewing the risks and benefits, the patient was                        deemed in satisfactory condition to undergo the                        procedure.                       After obtaining informed consent, the colonoscope was                        passed under direct vision. Throughout the procedure,                        the patient's blood pressure, pulse, and  oxygen                        saturations were monitored continuously. The                        Colonoscope was introduced through the anus and                        advanced to the the cecum, identified by appendiceal                         orifice and ileocecal valve. The colonoscopy was                        performed without difficulty. The patient tolerated the                        procedure well. The quality of the bowel preparation                        was excellent. Findings:      The perianal and digital rectal examinations were normal.      A 5 mm polyp was found in the sigmoid colon. The polyp was sessile. The       polyp was removed with a cold snare. Resection and retrieval were       complete.      Non-bleeding internal hemorrhoids were found during retroflexion. The       hemorrhoids were Grade II (internal hemorrhoids that prolapse but reduce       spontaneously). Impression:           - One 5 mm polyp in the sigmoid colon, removed with a                        cold snare. Resected and retrieved.                       - Non-bleeding internal hemorrhoids. Recommendation:       - Discharge patient to home.                       - Resume previous diet.                       - Continue present medications.                       - Await pathology results.                       - Repeat colonoscopy in 5 years if polyp adenoma and 10                        years if hyperplastic                       - To visualize the small bowel, perform video capsule                        endoscopy. Procedure Code(s):    --- Professional ---  45385, Colonoscopy, flexible; with removal of tumor(s),                        polyp(s), or other lesion(s) by snare technique Diagnosis Code(s):    --- Professional ---                       D50.9, Iron deficiency anemia, unspecified                       K63.5, Polyp of colon CPT copyright 2019 American Medical Association. All rights reserved. The codes documented in this report are preliminary and upon coder review may  be revised to meet current compliance requirements. Lucilla Lame MD, MD 06/10/2019 9:54:20 AM This report has been signed  electronically. Number of Addenda: 0 Note Initiated On: 06/10/2019 9:27 AM Scope Withdrawal Time: 0 hours 7 minutes 46 seconds  Total Procedure Duration: 0 hours 9 minutes 41 seconds  Estimated Blood Loss: Estimated blood loss: none.      [facility]

## 2019-06-10 NOTE — Anesthesia Procedure Notes (Signed)
Procedure Name: General with mask airway Performed by: Izetta Dakin, CRNA Pre-anesthesia Checklist: Timeout performed, Patient being monitored, Suction available, Emergency Drugs available and Patient identified Patient Re-evaluated:Patient Re-evaluated prior to induction Oxygen Delivery Method: Nasal cannula

## 2019-06-10 NOTE — Op Note (Signed)
[facility] Gastroenterology Patient Name: Kendra Rhodes Procedure Date: 06/10/2019 9:27 AM MRN: [id number] Account #: [id number] Date of Birth: 1956/09/20 Admit Type: Outpatient Age: 63 Room: [facility] OR ROOM 01 Gender: Female Note Status: Finalized Procedure:            Upper GI endoscopy Indications:          Iron deficiency anemia Providers:            Lucilla Lame MD, MD Referring MD:         Tressia Miners, MD (Referring MD), Vilinda Blanks. Wilson                        (Referring MD) Medicines:            Propofol per Anesthesia Complications:        No immediate complications. Procedure:            Pre-Anesthesia Assessment:                       - Prior to the procedure, a History and Physical was                        performed, and patient medications and allergies were                        reviewed. The patient's tolerance of previous                        anesthesia was also reviewed. The risks and benefits of                        the procedure and the sedation options and risks were                        discussed with the patient. All questions were                        answered, and informed consent was obtained. Prior                        Anticoagulants: The patient has taken no previous                        anticoagulant or antiplatelet agents. ASA Grade                        Assessment: II - A patient with mild systemic disease.                        After reviewing the risks and benefits, the patient was                        deemed in satisfactory condition to undergo the                        procedure.                       After obtaining informed consent, the endoscope was  passed under direct vision. Throughout the procedure,                        the patient's blood pressure, pulse, and oxygen                        saturations were monitored continuously. The was                        introduced through the  mouth, and advanced to the                        second part of duodenum. The upper GI endoscopy was                        accomplished without difficulty. The patient tolerated                        the procedure well. Findings:      The examined esophagus was normal.      Localized mild inflammation characterized by erythema was found in the       gastric antrum. Biopsies were taken with a cold forceps for histology.      The examined duodenum was normal. Impression:           - Normal esophagus.                       - Gastritis. Biopsied.                       - Normal examined duodenum. Recommendation:       - Discharge patient to home.                       - Resume previous diet.                       - Continue present medications.                       - Await pathology results.                       - Perform a colonoscopy today. Procedure Code(s):    --- Professional ---                       [phone], Esophagogastroduodenoscopy, flexible, transoral;                        with biopsy, single or multiple Diagnosis Code(s):    --- Professional ---                       D50.9, Iron deficiency anemia, unspecified                       K29.70, Gastritis, unspecified, without bleeding CPT copyright 2019 American Medical Association. All rights reserved. The codes documented in this report are preliminary and upon coder review may  be revised to meet current compliance requirements. Lucilla Lame MD, MD 06/10/2019 9:40:24 AM This report has been signed electronically. Number of Addenda: 0 Note Initiated On: 06/10/2019 9:27 AM Estimated Blood Loss:  Estimated blood loss: none.      [facility]

## 2019-06-10 NOTE — H&P (Signed)
Lucilla Lame, MD [facility] [address] [place], [place] [address] Phone:[phone] Fax : [phone]  Primary Care Physician:  Elwanda Brooklyn, MD Primary Gastroenterologist:  Dr. Allen Norris  Pre-Procedure History & Physical: HPI:  Kendra Rhodes is a 63 y.o. female is here for an endoscopy and colonoscopy.   Past Medical History:  Diagnosis Date  . Hypertension   . Wears contact lenses   . Wears dentures    upper and lower partials, also permanent retainer    Past Surgical History:  Procedure Laterality Date  . ABDOMINAL HYSTERECTOMY    . EYE SURGERY      Prior to Admission medications   Medication Sig Start Date End Date Taking? Authorizing Provider  atenolol (TENORMIN) 25 MG tablet atenolol 25 mg tablet 01/10/16  Yes [provider]  atorvastatin (LIPITOR) 40 MG tablet  12/03/17  Yes [provider]  azelastine (ASTELIN) 0.1 % nasal spray Place into the nose. 01/30/16  Yes [provider]  BELSOMRA 20 MG TABS  09/08/18  Yes [provider]  cyclobenzaprine (FLEXERIL) 10 MG tablet Take 1 tablet (10 mg total) by mouth at bedtime as needed for muscle spasms. Do not drive while taking as can cause drowsiness 01/05/19  Yes Marylene Land, NP  ezetimibe (ZETIA) 10 MG tablet  12/30/18  Yes [provider]  fluticasone (FLONASE) 50 MCG/ACT nasal spray fluticasone propionate 50 mcg/actuation nasal spray,suspension   Yes [provider]  hydrochlorothiazide (HYDRODIURIL) 25 MG tablet hydrochlorothiazide 25 mg tablet 02/19/16  Yes [provider]  montelukast (SINGULAIR) 10 MG tablet montelukast 10 mg tablet   Yes [provider]  TRULANCE 3 MG TABS  09/10/18  Yes [provider]  Vitamin D, Ergocalciferol, (DRISDOL) 1.25 MG (50000 UT) CAPS capsule Take by mouth. 01/30/16  Yes [provider]  buPROPion (WELLBUTRIN XL) 150 MG 24 hr tablet bupropion HCl XL 150 mg 24 hr tablet, extended release     [provider]  Cetirizine HCl 10 MG CAPS Take by mouth.    [provider]  citalopram (CELEXA) 20 MG tablet citalopram 20 mg tablet    [provider]  HYDROcodone-acetaminophen (NORCO/VICODIN) 5-325 MG tablet hydrocodone 5 mg-acetaminophen 325 mg tablet    [provider]  lisinopril (PRINIVIL,ZESTRIL) 10 MG tablet lisinopril 10 mg tablet 12/03/17   [provider]  Loteprednol-Tobramycin 0.5-0.3 % SUSP Zylet 0.3 %-0.5 % eye drops,suspension    [provider]  meloxicam (MOBIC) 15 MG tablet Take 1 tablet (15 mg total) by mouth daily as needed. Patient not taking: Reported on 06/07/2019 01/05/19   Marylene Land, NP  mupirocin cream (BACTROBAN) 2 % Apply topically. 02/20/16   [provider]  oxyCODONE-acetaminophen (PERCOCET/ROXICET) 5-325 MG tablet Take 1 tablet by mouth every 6 (six) hours as needed for severe pain. Do not drive while taking as can cause drowsiness. Patient not taking: Reported on 06/07/2019 01/05/19   Marylene Land, NP  traMADol Veatrice Bourbon) 50 MG tablet tramadol 50 mg tablet    [provider]    Allergies as of 05/27/2019 - Review Complete 02/24/2019  Allergen Reaction Noted  . Aspirin Other (See Comments) 09/30/2018  . Strawberry extract  02/24/2019  . Sulfa antibiotics Other (See Comments), Itching, and Swelling 03/25/2016  . Wheat bran  02/24/2019  . Prednisone Palpitations 03/25/2016    Family History  Problem Relation Age of Onset  . Breast cancer Mother 17    Social History   Socioeconomic History  .  Marital status: Divorced    Spouse name: Not on file  . Number of children: Not on file  . Years of education: Not on file  . Highest education level: Not on file  Occupational History  . Not on file  Social Needs  . Financial resource strain: Not on file  . Food insecurity    Worry: Not on file    Inability: Not on file  . Transportation needs    Medical: Not on file     Non-medical: Not on file  Tobacco Use  . Smoking status: Never Smoker  . Smokeless tobacco: Never Used  Substance and Sexual Activity  . Alcohol use: Never    Frequency: Never  . Drug use: Never  . Sexual activity: Not on file  Lifestyle  . Physical activity    Days per week: Not on file    Minutes per session: Not on file  . Stress: Not on file  Relationships  . Social Herbalist on phone: Not on file    Gets together: Not on file    Attends religious service: Not on file    Active member of club or organization: Not on file    Attends meetings of clubs or organizations: Not on file    Relationship status: Not on file  . Intimate partner violence    Fear of current or ex partner: Not on file    Emotionally abused: Not on file    Physically abused: Not on file    Forced sexual activity: Not on file  Other Topics Concern  . Not on file  Social History Narrative  . Not on file    Review of Systems: See HPI, otherwise negative ROS  Physical Exam: BP (!) 127/93   Pulse 82   Temp (!) 97.3 F (36.3 C)   Ht 5\' 6"  (1.676 m)   Wt 82.6 kg   SpO2 100%   BMI 29.38 kg/m  General:   Alert,  pleasant and cooperative in NAD Head:  Normocephalic and atraumatic. Neck:  Supple; no masses or thyromegaly. Lungs:  Clear throughout to auscultation.    Heart:  Regular rate and rhythm. Abdomen:  Soft, nontender and nondistended. Normal bowel sounds, without guarding, and without rebound.   Neurologic:  Alert and  oriented x4;  grossly normal neurologically.  Impression/Plan: Kendra Rhodes is here for an endoscopy and colonoscopy to be performed for IDA  Risks, benefits, limitations, and alternatives regarding  endoscopy and colonoscopy have been reviewed with the patient.  Questions have been answered.  All parties agreeable.   Lucilla Lame, MD  06/10/2019, 8:47 AM

## 2019-06-10 NOTE — Transfer of Care (Signed)
Immediate Anesthesia Transfer of Care Note  Patient: Kendra Rhodes  Procedure(s) Performed: COLONOSCOPY WITH PROPOFOL (N/A ) ESOPHAGOGASTRODUODENOSCOPY (EGD) WITH PROPOFOL (N/A ) POLYPECTOMY  Patient Location: PACU  Anesthesia Type: General  Level of Consciousness: awake, alert  and patient cooperative  Airway and Oxygen Therapy: Patient Spontanous Breathing and Patient connected to supplemental oxygen  Post-op Assessment: Post-op Vital signs reviewed, Patient's Cardiovascular Status Stable, Respiratory Function Stable, Patent Airway and No signs of Nausea or vomiting  Post-op Vital Signs: Reviewed and stable  Complications: No apparent anesthesia complications

## 2019-06-10 NOTE — Anesthesia Postprocedure Evaluation (Signed)
Anesthesia Post Note  Patient: Kendra Rhodes  Procedure(s) Performed: COLONOSCOPY WITH PROPOFOL (N/A ) ESOPHAGOGASTRODUODENOSCOPY (EGD) WITH PROPOFOL (N/A ) POLYPECTOMY  Patient location during evaluation: PACU Anesthesia Type: General Level of consciousness: awake and alert Pain management: pain level controlled Vital Signs Assessment: post-procedure vital signs reviewed and stable Respiratory status: spontaneous breathing, nonlabored ventilation, respiratory function stable and patient connected to nasal cannula oxygen Cardiovascular status: blood pressure returned to baseline and stable Postop Assessment: no apparent nausea or vomiting Anesthetic complications: no    Maji, [place]

## 2019-06-10 NOTE — Anesthesia Preprocedure Evaluation (Signed)
Anesthesia Evaluation  Patient identified by MRN, date of birth, ID band Patient awake    Reviewed: NPO status   History of Anesthesia Complications Negative for: history of anesthetic complications  Airway Mallampati: II  TM Distance: >3 FB Neck ROM: full    Dental  (+) Upper Dentures, Lower Dentures, Missing   Pulmonary neg pulmonary ROS,    Pulmonary exam normal        Cardiovascular Exercise Tolerance: Good hypertension, Normal cardiovascular exam     Neuro/Psych Anxiety negative neurological ROS     GI/Hepatic negative GI ROS, Neg liver ROS,   Endo/Other  Morbid obesity (bmi=29)  Renal/GU negative Renal ROS  negative genitourinary   Musculoskeletal   Abdominal   Peds  Hematology negative hematology ROS (+)   Anesthesia Other Findings   Reproductive/Obstetrics                             Anesthesia Physical Anesthesia Plan  ASA: II  Anesthesia Plan: General   Post-op Pain Management:    Induction:   PONV Risk Score and Plan:   Airway Management Planned: Natural Airway  Additional Equipment:   Intra-op Plan:   Post-operative Plan:   Informed Consent: I have reviewed the patients History and Physical, chart, labs and discussed the procedure including the risks, benefits and alternatives for the proposed anesthesia with the patient or authorized representative who has indicated his/her understanding and acceptance.       Plan Discussed with: CRNA  Anesthesia Plan Comments:         Anesthesia Quick Evaluation

## 2019-06-11 ENCOUNTER — Encounter: Payer: Self-pay | Admitting: Gastroenterology

## 2019-06-14 ENCOUNTER — Other Ambulatory Visit: Payer: Self-pay | Admitting: Family Medicine

## 2019-06-14 ENCOUNTER — Telehealth: Payer: Self-pay | Admitting: Gastroenterology

## 2019-06-14 DIAGNOSIS — Z1231 Encounter for screening mammogram for malignant neoplasm of breast: Secondary | ICD-10-CM

## 2019-06-14 NOTE — Telephone Encounter (Signed)
Received Capsule Endo request will send it to the doctor to review.

## 2019-06-14 NOTE — Telephone Encounter (Signed)
Left vm letting pt know the Capsule study request has been sent to [facility] to schedule.

## 2019-06-14 NOTE — Telephone Encounter (Signed)
Pt left vm stating Dr. Allen Norris wanted her to have another procedure and she would like to know when please call pt

## 2019-06-14 NOTE — Telephone Encounter (Signed)
Patient called & I relayed Kamas from the telephone message that she had sent the request to [facility] & they confirmed they received the request. I let her know if she did not hear anything within the week to call back.

## 2019-06-15 ENCOUNTER — Encounter: Payer: Self-pay | Admitting: Gastroenterology

## 2019-06-15 NOTE — Telephone Encounter (Signed)
-----   Message from Lucilla Lame, MD sent at 06/11/2019  6:25 AM EDT ----- Let the patient know that the fractionation of her alkaline phosphatase showed that the enzyme is coming from her liver and not her bone.  The other blood tests was normal.  The alkaline phosphatase has come down but not to normal.  She should come in for a repeat lab test and office visit in 1 month to discuss what other possible causes can be causing this elevation.

## 2019-06-15 NOTE — Telephone Encounter (Signed)
LVM for pt to return my call regarding lab results.   

## 2019-06-17 ENCOUNTER — Telehealth: Payer: Self-pay

## 2019-06-17 NOTE — Telephone Encounter (Signed)
LM for pt to call back to schedule Capsule Endoscopy. Ok per Dr. Havery Moros. Pt of Dr. Allen Norris at [place].

## 2019-06-21 NOTE — Telephone Encounter (Signed)
LM for pt to call back to be scheduled for capsule endoscopy

## 2019-06-22 ENCOUNTER — Ambulatory Visit: Payer: Federal, State, Local not specified - PPO | Admitting: Internal Medicine

## 2019-06-22 NOTE — Telephone Encounter (Signed)
Pt notified of lab results and follow up appt scheduled for 08/02/19.

## 2019-06-22 NOTE — Telephone Encounter (Signed)
-----   Message from Lucilla Lame, MD sent at 06/11/2019  6:25 AM EDT ----- Let the patient know that the fractionation of her alkaline phosphatase showed that the enzyme is coming from her liver and not her bone.  The other blood tests was normal.  The alkaline phosphatase has come down but not to normal.  She should come in for a repeat lab test and office visit in 1 month to discuss what other possible causes can be causing this elevation.

## 2019-07-14 ENCOUNTER — Other Ambulatory Visit: Payer: Self-pay | Admitting: Family Medicine

## 2019-07-14 DIAGNOSIS — Z1231 Encounter for screening mammogram for malignant neoplasm of breast: Secondary | ICD-10-CM

## 2019-07-15 ENCOUNTER — Other Ambulatory Visit: Payer: Self-pay

## 2019-07-16 ENCOUNTER — Ambulatory Visit: Payer: Federal, State, Local not specified - PPO | Admitting: Internal Medicine

## 2019-07-26 ENCOUNTER — Telehealth: Payer: Self-pay

## 2019-07-26 ENCOUNTER — Other Ambulatory Visit: Payer: Self-pay

## 2019-07-26 DIAGNOSIS — R748 Abnormal levels of other serum enzymes: Secondary | ICD-10-CM

## 2019-07-26 NOTE — Telephone Encounter (Signed)
error 

## 2019-07-29 DIAGNOSIS — R748 Abnormal levels of other serum enzymes: Secondary | ICD-10-CM | POA: Diagnosis not present

## 2019-07-30 LAB — HEPATIC FUNCTION PANEL
ALT: 17 IU/L (ref 0–32)
AST: 16 IU/L (ref 0–40)
Albumin: 4.4 g/dL (ref 3.8–4.8)
Alkaline Phosphatase: 146 IU/L — ABNORMAL HIGH (ref 39–117)
Bilirubin Total: 0.4 mg/dL (ref 0.0–1.2)
Bilirubin, Direct: 0.12 mg/dL (ref 0.00–0.40)
Total Protein: 7 g/dL (ref 6.0–8.5)

## 2019-08-02 ENCOUNTER — Encounter: Payer: Self-pay | Admitting: Gastroenterology

## 2019-08-02 ENCOUNTER — Encounter (INDEPENDENT_AMBULATORY_CARE_PROVIDER_SITE_OTHER): Payer: Self-pay

## 2019-08-02 ENCOUNTER — Other Ambulatory Visit: Payer: Self-pay

## 2019-08-02 ENCOUNTER — Ambulatory Visit (INDEPENDENT_AMBULATORY_CARE_PROVIDER_SITE_OTHER): Payer: Federal, State, Local not specified - PPO | Admitting: Gastroenterology

## 2019-08-02 VITALS — BP 120/80 | HR 103 | Temp 96.8°F | Ht 66.0 in | Wt 196.0 lb

## 2019-08-02 DIAGNOSIS — R748 Abnormal levels of other serum enzymes: Secondary | ICD-10-CM

## 2019-08-02 NOTE — Progress Notes (Signed)
Primary Care Physician: Elwanda Brooklyn, MD  Primary Gastroenterologist:  Dr. Lucilla Lame  Chief Complaint  Patient presents with  . Follow-up    HPI: Kendra Rhodes is a 63 y.o. female here for follow-up of increased alkaline phosphatase.  The patient had her alkaline phosphatase fractionated which showed most of the to be coming from the liver and not the bone.  The patient had a colonoscopy which showed a tubular adenoma in the colon.  The patient also had an upper endoscopy at that time for iron deficiency anemia without a source of blood loss seen on either procedure.  The patient had a negative antimitochondrial antibody. The patient has not had a capsule endoscopy for evaluation of her iron deficiency anemia and an ultrasound has not been done for her elevated alkaline phosphatase.    Current Outpatient Medications  Medication Sig Dispense Refill  . atenolol (TENORMIN) 25 MG tablet atenolol 25 mg tablet    . atorvastatin (LIPITOR) 40 MG tablet     . azelastine (ASTELIN) 0.1 % nasal spray Place into the nose.    . BELSOMRA 20 MG TABS     . buPROPion (WELLBUTRIN XL) 150 MG 24 hr tablet bupropion HCl XL 150 mg 24 hr tablet, extended release    . Cetirizine HCl 10 MG CAPS Take by mouth.    . citalopram (CELEXA) 20 MG tablet citalopram 20 mg tablet    . cyclobenzaprine (FLEXERIL) 10 MG tablet Take 1 tablet (10 mg total) by mouth at bedtime as needed for muscle spasms. Do not drive while taking as can cause drowsiness 10 tablet 0  . ezetimibe (ZETIA) 10 MG tablet     . fluticasone (FLONASE) 50 MCG/ACT nasal spray fluticasone propionate 50 mcg/actuation nasal spray,suspension    . hydrochlorothiazide (HYDRODIURIL) 25 MG tablet hydrochlorothiazide 25 mg tablet    . HYDROcodone-acetaminophen (NORCO/VICODIN) 5-325 MG tablet hydrocodone 5 mg-acetaminophen 325 mg tablet    . lisinopril (PRINIVIL,ZESTRIL) 10 MG tablet lisinopril 10 mg tablet    . Loteprednol-Tobramycin 0.5-0.3 % SUSP  Zylet 0.3 %-0.5 % eye drops,suspension    . montelukast (SINGULAIR) 10 MG tablet montelukast 10 mg tablet    . mupirocin cream (BACTROBAN) 2 % Apply topically.    . traMADol (ULTRAM) 50 MG tablet tramadol 50 mg tablet    . TRULANCE 3 MG TABS     . Vitamin D, Ergocalciferol, (DRISDOL) 1.25 MG (50000 UT) CAPS capsule Take by mouth.    . meloxicam (MOBIC) 15 MG tablet Take 1 tablet (15 mg total) by mouth daily as needed. (Patient not taking: Reported on 08/02/2019) 10 tablet 0  . oxyCODONE-acetaminophen (PERCOCET/ROXICET) 5-325 MG tablet Take 1 tablet by mouth every 6 (six) hours as needed for severe pain. Do not drive while taking as can cause drowsiness. (Patient not taking: Reported on 06/07/2019) 5 tablet 0   No current facility-administered medications for this visit.     Allergies as of 08/02/2019 - Review Complete 08/02/2019  Allergen Reaction Noted  . Aspirin Other (See Comments) 09/30/2018  . Shellfish allergy Itching 06/07/2019  . Strawberry extract  02/24/2019  . Sulfa antibiotics Other (See Comments), Itching, and Swelling 03/25/2016  . Wheat bran  02/24/2019  . Prednisone Palpitations 03/25/2016    ROS:  General: Negative for anorexia, weight loss, fever, chills, fatigue, weakness. ENT: Negative for hoarseness, difficulty swallowing , nasal congestion. CV: Negative for chest pain, angina, palpitations, dyspnea on exertion, peripheral edema.  Respiratory: Negative for dyspnea at rest,  dyspnea on exertion, cough, sputum, wheezing.  GI: See history of present illness. GU:  Negative for dysuria, hematuria, urinary incontinence, urinary frequency, nocturnal urination.  Endo: Negative for unusual weight change.    Physical Examination:   BP 120/80   Pulse (!) 103   Temp (!) 96.8 F (36 C) (Oral)   Ht 5\' 6"  (1.676 m)   Wt 196 lb (88.9 kg)   BMI 31.64 kg/m   General: Well-nourished, well-developed in no acute distress.  Eyes: No icterus. Conjunctivae pink. Mouth:  Oropharyngeal mucosa moist and pink , no lesions erythema or exudate. Lungs: Clear to auscultation bilaterally. Non-labored. Heart: Regular rate and rhythm, no murmurs rubs or gallops.  Abdomen: Bowel sounds are normal, nontender, nondistended, no hepatosplenomegaly or masses, no abdominal bruits or hernia , no rebound or guarding.   Extremities: No lower extremity edema. No clubbing or deformities. Neuro: Alert and oriented x 3.  Grossly intact. Skin: Warm and dry, no jaundice.   Psych: Alert and cooperative, normal mood and affect.  Labs:    Imaging Studies: No results found.  Assessment and Plan:   Kendra Rhodes is a 63 y.o. y/o female who comes in today for follow-up of iron deficiency anemia.  The patient will be set up for a capsule endoscopy.  She will also be set up for a right upper quadrant ultrasound due to her abnormal alkaline phosphatase with fractionation showing it to be predominantly from a liver cause.  The patient has also been told that she will need a repeat colonoscopy in 5 years due to her tubular adenoma.  Below is the algorithm for the work-up of alkaline phosphatase for reference.  The patient has been explained the plan and agrees with it.      Lucilla Lame, MD. Marval Regal   Note: This dictation was prepared with Dragon dictation along with smaller phrase technology. Any transcriptional errors that result from this process are unintentional.

## 2019-08-03 ENCOUNTER — Other Ambulatory Visit: Payer: Self-pay

## 2019-08-03 DIAGNOSIS — D649 Anemia, unspecified: Secondary | ICD-10-CM

## 2019-08-05 ENCOUNTER — Other Ambulatory Visit: Payer: Self-pay

## 2019-08-06 ENCOUNTER — Other Ambulatory Visit: Payer: Self-pay

## 2019-08-06 ENCOUNTER — Ambulatory Visit
Admission: RE | Admit: 2019-08-06 | Discharge: 2019-08-06 | Disposition: A | Discharge status: Home / Self Care | Payer: Federal, State, Local not specified - PPO | Source: Ambulatory Visit | Attending: Gastroenterology | Admitting: Gastroenterology

## 2019-08-06 ENCOUNTER — Encounter (INDEPENDENT_AMBULATORY_CARE_PROVIDER_SITE_OTHER): Payer: Self-pay

## 2019-08-06 DIAGNOSIS — K8020 Calculus of gallbladder without cholecystitis without obstruction: Secondary | ICD-10-CM | POA: Diagnosis not present

## 2019-08-06 DIAGNOSIS — K7689 Other specified diseases of liver: Secondary | ICD-10-CM | POA: Diagnosis not present

## 2019-08-06 DIAGNOSIS — R748 Abnormal levels of other serum enzymes: Secondary | ICD-10-CM | POA: Diagnosis not present

## 2019-08-10 ENCOUNTER — Ambulatory Visit (INDEPENDENT_AMBULATORY_CARE_PROVIDER_SITE_OTHER): Payer: Federal, State, Local not specified - PPO | Admitting: Internal Medicine

## 2019-08-10 ENCOUNTER — Other Ambulatory Visit: Payer: Self-pay

## 2019-08-10 ENCOUNTER — Encounter: Payer: Self-pay | Admitting: Internal Medicine

## 2019-08-10 VITALS — BP 128/78 | HR 76 | Temp 98.8°F | Resp 16 | Ht 66.0 in | Wt 187.6 lb

## 2019-08-10 DIAGNOSIS — E785 Hyperlipidemia, unspecified: Secondary | ICD-10-CM

## 2019-08-10 DIAGNOSIS — Z1329 Encounter for screening for other suspected endocrine disorder: Secondary | ICD-10-CM

## 2019-08-10 DIAGNOSIS — E042 Nontoxic multinodular goiter: Secondary | ICD-10-CM | POA: Insufficient documentation

## 2019-08-10 DIAGNOSIS — I10 Essential (primary) hypertension: Secondary | ICD-10-CM

## 2019-08-10 DIAGNOSIS — E1169 Type 2 diabetes mellitus with other specified complication: Secondary | ICD-10-CM | POA: Insufficient documentation

## 2019-08-10 DIAGNOSIS — E611 Iron deficiency: Secondary | ICD-10-CM

## 2019-08-10 DIAGNOSIS — G4700 Insomnia, unspecified: Secondary | ICD-10-CM

## 2019-08-10 DIAGNOSIS — Z803 Family history of malignant neoplasm of breast: Secondary | ICD-10-CM

## 2019-08-10 DIAGNOSIS — I152 Hypertension secondary to endocrine disorders: Secondary | ICD-10-CM | POA: Insufficient documentation

## 2019-08-10 DIAGNOSIS — F329 Major depressive disorder, single episode, unspecified: Secondary | ICD-10-CM

## 2019-08-10 DIAGNOSIS — E559 Vitamin D deficiency, unspecified: Secondary | ICD-10-CM

## 2019-08-10 DIAGNOSIS — Z1159 Encounter for screening for other viral diseases: Secondary | ICD-10-CM

## 2019-08-10 DIAGNOSIS — Z13818 Encounter for screening for other digestive system disorders: Secondary | ICD-10-CM

## 2019-08-10 DIAGNOSIS — E782 Mixed hyperlipidemia: Secondary | ICD-10-CM | POA: Insufficient documentation

## 2019-08-10 DIAGNOSIS — F32A Depression, unspecified: Secondary | ICD-10-CM

## 2019-08-10 DIAGNOSIS — R739 Hyperglycemia, unspecified: Secondary | ICD-10-CM

## 2019-08-10 DIAGNOSIS — K5909 Other constipation: Secondary | ICD-10-CM

## 2019-08-10 DIAGNOSIS — R748 Abnormal levels of other serum enzymes: Secondary | ICD-10-CM

## 2019-08-10 DIAGNOSIS — N952 Postmenopausal atrophic vaginitis: Secondary | ICD-10-CM

## 2019-08-10 DIAGNOSIS — Z1389 Encounter for screening for other disorder: Secondary | ICD-10-CM

## 2019-08-10 MED ORDER — TEMAZEPAM 15 MG PO CAPS: 15.0000 mg | ORAL_CAPSULE | Freq: Every evening | ORAL | 0 refills | Status: DC | PRN

## 2019-08-10 NOTE — Progress Notes (Signed)
Chief Complaint  Patient presents with  . Establish Care   New patient form PCP Dr. Elwanda Brooklyn in [place] 1. HTN BP controlled today on atenolol 25 mg qd, hctz 25 mg qd, lisinopril 10 mg qd  2. Chronic constipation on Trulance 3 mg qd  3. Chronic insomnia walking up 2-3 x at night and h/o depression for now she declines meds for depression and wants to try meds for insomnia tried belsombra 20 mg qhs but did not like this PHQ 9 score 10 today   4. HLD on zetia 10 and lipitor 40 mg qhs  5. Elevated alk phos 146 07/29/19 will w/u further with labs  Review of Systems  Constitutional: Negative for weight loss.  HENT: Negative for hearing loss.   Eyes: Negative for blurred vision.  Respiratory: Negative for shortness of breath.   Cardiovascular: Negative for chest pain.  Gastrointestinal: Negative for abdominal pain.  Skin: Negative for rash.  Neurological: Negative for headaches.  Psychiatric/Behavioral: Positive for depression. The patient has insomnia.    Past Medical History:  Diagnosis Date  . Depression   . Hypertension   . Sickle cell trait ([place])   . Wears contact lenses   . Wears dentures    upper and lower partials, also permanent retainer   Past Surgical History:  Procedure Laterality Date  . ABDOMINAL HYSTERECTOMY    . COLONOSCOPY WITH PROPOFOL N/A 06/10/2019   Procedure: COLONOSCOPY WITH PROPOFOL;  Surgeon: Lucilla Lame, MD;  Location: [facility];  Service: Endoscopy;  Laterality: N/A;  . ESOPHAGOGASTRODUODENOSCOPY (EGD) WITH PROPOFOL N/A 06/10/2019   Procedure: ESOPHAGOGASTRODUODENOSCOPY (EGD) WITH PROPOFOL;  Surgeon: Lucilla Lame, MD;  Location: [facility];  Service: Endoscopy;  Laterality: N/A;  . EYE SURGERY     right eye left eye pending needs done as of 08/10/19   . POLYPECTOMY  06/10/2019   Procedure: POLYPECTOMY;  Surgeon: Lucilla Lame, MD;  Location: [facility];  Service: Endoscopy;;   Family History  Problem Relation Age of  Onset  . Breast cancer Mother 58  . Diabetes Mother   . Kidney failure Father   . Alcohol abuse Father   . Heart disease Father        chf  . Hyperlipidemia Father   . Other Other        fatty liver cousin  . Sickle cell anemia Other        nephew  . Cancer Other        aunt with ovarian cancer  . Lung cancer Maternal Aunt    Social History   Socioeconomic History  . Marital status: Divorced    Spouse name: Not on file  . Number of children: Not on file  . Years of education: Not on file  . Highest education level: Not on file  Occupational History  . Not on file  Social Needs  . Financial resource strain: Not on file  . Food insecurity    Worry: Not on file    Inability: Not on file  . Transportation needs    Medical: Not on file    Non-medical: Not on file  Tobacco Use  . Smoking status: Never Smoker  . Smokeless tobacco: Never Used  Substance and Sexual Activity  . Alcohol use: Never    Frequency: Never  . Drug use: Never  . Sexual activity: Yes  Lifestyle  . Physical activity    Days per week: Not on file    Minutes per  session: Not on file  . Stress: Not on file  Relationships  . Social Herbalist on phone: Not on file    Gets together: Not on file    Attends religious service: Not on file    Active member of club or organization: Not on file    Attends meetings of clubs or organizations: Not on file    Relationship status: Not on file  . Intimate partner violence    Fear of current or ex partner: Not on file    Emotionally abused: Not on file    Physically abused: Not on file    Forced sexual activity: Not on file  Other Topics Concern  . Not on file  Social History Narrative   [employer] [place]    2 kids daughter Corky Sing and son Ezekiel Sloc[place]   Works homeless shelter weekends    No outpatient medications have been marked as taking for the 08/10/19 encounter (Office Visit) with McLean-Scocuzza, Nino Glow, MD.    Allergies  Allergen Reactions  . Aspirin Other (See Comments)  . Shellfish Allergy Itching  . Strawberry Extract   . Sulfa Antibiotics Other (See Comments), Itching and Swelling  . Wheat Bran   . Prednisone Palpitations   Recent Results (from the past 2160 hour(s))  Alkaline Phosphatase, Isoenzymes     Status: Abnormal   Collection Time: 06/07/19  9:23 AM  Result Value Ref Range   Alkaline Phosphatase 134 (H) 39 - 117 IU/L   LIVER FRACTION 73 18 - 85 %   BONE FRACTION 23 14 - 68 %   INTESTINAL FRAC. 4 0 - 18 %  Mitochondrial antibodies     Status: None   Collection Time: 06/07/19  9:23 AM  Result Value Ref Range   Mitochondrial Ab <20.0 0.0 - 20.0 Units    Comment:                                 Negative    0.0 - 20.0                                 Equivocal  20.1 - 24.9                                 Positive         >24.9 Mitochondrial (M2) Antibodies are found in 90-96% of patients with primary biliary cirrhosis.   Novel Coronavirus, NAA (hospital order; send-out to ref lab)     Status: None   Collection Time: 06/07/19 10:32 AM   Specimen: Nasopharyngeal Swab; Respiratory  Result Value Ref Range   SARS-CoV-2, NAA NOT DETECTED NOT DETECTED    Comment: (NOTE) This test was developed and its performance characteristics determined by [employer]. This test has not been FDA cleared or approved. This test has been authorized by FDA under an Emergency Use Authorization (EUA). This test is only authorized for the duration of time the declaration that circumstances exist justifying the authorization of the emergency use of in vitro diagnostic tests for detection of SARS-CoV-2 virus and/or diagnosis of COVID-19 infection under section 564(b)(1) of the Act, 21 U.S.C. 671IWP-8(K)(9), unless the authorization is terminated or revoked sooner. When diagnostic testing is negative, the possibility of a false negative result  should be considered in the context of  a patient's recent exposures and the presence of clinical signs and symptoms consistent with COVID-19. An individual without symptoms of COVID-19 and who is not shedding SARS-CoV-2 virus would expect to have a negative (not detected) result in this assay. Performed  At: [facility] [place], [place] [id number] Rush Farmer MD TZ:[id number]    Coronavirus Source NASOPHARYNGEAL     Comment: Performed at [facility], [address] [place], [place] [address]  Hepatic function panel     Status: Abnormal   Collection Time: 07/29/19  8:14 AM  Result Value Ref Range   Total Protein 7.0 6.0 - 8.5 g/dL   Albumin 4.4 3.8 - 4.8 g/dL   Bilirubin Total 0.4 0.0 - 1.2 mg/dL   Bilirubin, Direct 0.12 0.00 - 0.40 mg/dL   Alkaline Phosphatase 146 (H) 39 - 117 IU/L   AST 16 0 - 40 IU/L   ALT 17 0 - 32 IU/L   Objective  Body mass index is 30.28 kg/m. Wt Readings from Last 3 Encounters:  08/10/19 187 lb 9.6 oz (85.1 kg)  08/02/19 196 lb (88.9 kg)  06/10/19 182 lb (82.6 kg)   Temp Readings from Last 3 Encounters:  08/10/19 98.8 F (37.1 C) (Oral)  08/02/19 (!) 96.8 F (36 C) (Oral)  06/10/19 (!) 97 F (36.1 C)   BP Readings from Last 3 Encounters:  08/10/19 128/78  08/02/19 120/80  06/10/19 140/90   Pulse Readings from Last 3 Encounters:  08/10/19 76  08/02/19 (!) 103  06/10/19 81    Physical Exam Vitals signs and nursing note reviewed.  Constitutional:      Appearance: Normal appearance. She is well-developed and well-groomed. She is obese.  HENT:     Head: Normocephalic and atraumatic.     Comments: Mask on   Cardiovascular:     Rate and Rhythm: Normal rate and regular rhythm.     Heart sounds: Normal heart sounds.  Pulmonary:     Effort: Pulmonary effort is normal.     Breath sounds: Normal breath sounds.  Skin:    General: Skin is warm and dry.  Neurological:     General: No focal deficit present.     Mental Status: She is alert  and oriented to person, place, and time. Mental status is at baseline.     Gait: Gait normal.  Psychiatric:        Attention and Perception: Attention and perception normal.        Mood and Affect: Mood and affect normal.        Speech: Speech normal.        Behavior: Behavior normal. Behavior is cooperative.        Thought Content: Thought content normal.        Cognition and Memory: Cognition and memory normal.        Judgment: Judgment normal.     Assessment   1. H/o depression and insomnia 2. HTN controlled/HLD 3. Chronic constipation  4. Elevated AP 5. HM 6. H/o vaginal dryness  Plan   1. Was on belsombra 20 mg qhs did not help her maintain sleep walking up 2-3 x per night  Rx Restoril 15 mg qhs  Declines antidepressant for now  2. Cont meds  Check fasting labs [facility] will go to [place] location  3. Cont trulance 3 mg qd  4.  W/u repeat CMET, ggt  US abdomen 08/06/19 with GS, septated cystic  lesion left lobe of liver 3.9 x 3.6 x 3.7 with same measurements on prior CT in 2014 w/o new liver lesions  Medical renal kidney disease   5.   Will get flu shot at hospital  Requested records of vaccines from prior PCP Consider Tdap and shingrix  [facility]amo sch 08/24/19 through prior PCP request records  S/p hysterectomy no h/o abnormal pap, ovaries intact consider pelvic in future  Dexa consider age 61  colonoscopy Dr. Allen Norris 06/10/19/egd chronic gastritis, tubular adenoma f/u in 5 years  6. Disc ky jelly and otc replens Provider: Dr. Olivia Mackie McLean-Scocuzza-Internal Medicine

## 2019-08-10 NOTE — Patient Instructions (Addendum)
[place] jelly water based  replens to restore moisture    Insomnia Insomnia is a sleep disorder that makes it difficult to fall asleep or stay asleep. Insomnia can cause fatigue, low energy, difficulty concentrating, mood swings, and poor performance at work or school. There are three different ways to classify insomnia:  Difficulty falling asleep.  Difficulty staying asleep.  Waking up too early in the morning. Any type of insomnia can be Haggart-term (chronic) or short-term (acute). Both are common. Short-term insomnia usually lasts for three months or less. Chronic insomnia occurs at least three times a week for longer than three months. What are the causes? Insomnia may be caused by another condition, situation, or substance, such as:  Anxiety.  Certain medicines.  Gastroesophageal reflux disease (GERD) or other gastrointestinal conditions.  Asthma or other breathing conditions.  Restless legs syndrome, sleep apnea, or other sleep disorders.  Chronic pain.  Menopause.  Stroke.  Abuse of alcohol, tobacco, or illegal drugs.  Mental health conditions, such as depression.  Caffeine.  Neurological disorders, such as Alzheimer's disease.  An overactive thyroid (hyperthyroidism). Sometimes, the cause of insomnia may not be known. What increases the risk? Risk factors for insomnia include:  Gender. Women are affected more often than men.  Age. Insomnia is more common as you get older.  Stress.  Lack of exercise.  Irregular work schedule or working night shifts.  Traveling between different time zones.  Certain medical and mental health conditions. What are the signs or symptoms? If you have insomnia, the main symptom is having trouble falling asleep or having trouble staying asleep. This may lead to other symptoms, such as:  Feeling fatigued or having low energy.  Feeling nervous about going to sleep.  Not feeling rested in the morning.  Having trouble  concentrating.  Feeling irritable, anxious, or depressed. How is this diagnosed? This condition may be diagnosed based on:  Your symptoms and medical history. Your health care provider may ask about: ? Your sleep habits. ? Any medical conditions you have. ? Your mental health.  A physical exam. How is this treated? Treatment for insomnia depends on the cause. Treatment may focus on treating an underlying condition that is causing insomnia. Treatment may also include:  Medicines to help you sleep.  Counseling or therapy.  Lifestyle adjustments to help you sleep better. Follow these instructions at home: Eating and drinking   Limit or avoid alcohol, caffeinated beverages, and cigarettes, especially close to bedtime. These can disrupt your sleep.  Do not eat a large meal or eat spicy foods right before bedtime. This can lead to digestive discomfort that can make it hard for you to sleep. Sleep habits   Keep a sleep diary to help you and your health care provider figure out what could be causing your insomnia. Write down: ? When you sleep. ? When you wake up during the night. ? How well you sleep. ? How rested you feel the next day. ? Any side effects of medicines you are taking. ? What you eat and drink.  Make your bedroom a dark, comfortable place where it is easy to fall asleep. ? Put up shades or blackout curtains to block light from outside. ? Use a white noise machine to block noise. ? Keep the temperature cool.  Limit screen use before bedtime. This includes: ? Watching TV. ? Using your smartphone, tablet, or computer.  Stick to a routine that includes going to bed and waking up at the same times  every day and night. This can help you fall asleep faster. Consider making a quiet activity, such as reading, part of your nighttime routine.  Try to avoid taking naps during the day so that you sleep better at night.  Get out of bed if you are still awake after 15  minutes of trying to sleep. Keep the lights down, but try reading or doing a quiet activity. When you feel sleepy, go back to bed. General instructions  Take over-the-counter and prescription medicines only as told by your health care provider.  Exercise regularly, as told by your health care provider. Avoid exercise starting several hours before bedtime.  Use relaxation techniques to manage stress. Ask your health care provider to suggest some techniques that may work well for you. These may include: ? Breathing exercises. ? Routines to release muscle tension. ? Visualizing peaceful scenes.  Make sure that you drive carefully. Avoid driving if you feel very sleepy.  Keep all follow-up visits as told by your health care provider. This is important. Contact a health care provider if:  You are tired throughout the day.  You have trouble in your daily routine due to sleepiness.  You continue to have sleep problems, or your sleep problems get worse. Get help right away if:  You have serious thoughts about hurting yourself or someone else. If you ever feel like you may hurt yourself or others, or have thoughts about taking your own life, get help right away. You can go to your nearest emergency department or call:  Your local emergency services (911 in the [place]  A suicide crisis helpline, such as the [place] at 540-712-3157. This is open 24 hours a day. Summary  Insomnia is a sleep disorder that makes it difficult to fall asleep or stay asleep.  Insomnia can be Urda-term (chronic) or short-term (acute).  Treatment for insomnia depends on the cause. Treatment may focus on treating an underlying condition that is causing insomnia.  Keep a sleep diary to help you and your health care provider figure out what could be causing your insomnia. This information is not intended to replace advice given to you by your health care provider. Make sure you discuss  any questions you have with your health care provider. Document Released: 11/29/2000 Document Revised: 11/14/2017 Document Reviewed: 09/11/2017 Elsevier Patient Education  2020 [employer].   Atrophic Vaginitis  Atrophic vaginitis is a condition in which the tissues that line the vagina become dry and thin. This condition is most common in women who have stopped having regular menstrual periods (are in menopause). This usually starts when a woman is 70-46 years old. That is the time when a woman's estrogen levels begin to drop (decrease). Estrogen is a female hormone. It helps to keep the tissues of the vagina moist. It stimulates the vagina to produce a clear fluid that lubricates the vagina for sexual intercourse. This fluid also protects the vagina from infection. Lack of estrogen can cause the lining of the vagina to get thinner and dryer. The vagina may also shrink in size. It may become less elastic. Atrophic vaginitis tends to get worse over time as a woman's estrogen level drops. What are the causes? This condition is caused by the normal drop in estrogen that happens around the time of menopause. What increases the risk? Certain conditions or situations may lower a woman's estrogen level, leading to a higher risk for atrophic vaginitis. You are more likely to develop this condition  if:  You are taking medicines that block estrogen.  You have had your ovaries removed.  You are being treated for cancer with X-ray (radiation) or medicines (chemotherapy).  You have given birth or are breastfeeding.  You are older than age 74.  You smoke. What are the signs or symptoms? Symptoms of this condition include:  Pain, soreness, or bleeding during sexual intercourse (dyspareunia).  Vaginal burning, irritation, or itching.  Pain or bleeding when a speculum is used in a vaginal exam (pelvic exam).  Having burning pain when passing urine.  Vaginal discharge that is brown or yellow.  In some cases, there are no symptoms. How is this diagnosed? This condition is diagnosed by taking a medical history and doing a physical exam. This will include a pelvic exam that checks the vaginal tissues. Though rare, you may also have other tests, including:  A urine test.  A test that checks the acid balance in your vagina (acid balance test). How is this treated? Treatment for this condition depends on how severe your symptoms are. Treatment may include:  Using an over-the-counter vaginal lubricant before sex.  Using a Anctil-acting vaginal moisturizer.  Using low-dose vaginal estrogen for moderate to severe symptoms that do not respond to other treatments. Options include creams, tablets, and inserts (vaginal rings). Before you use a vaginal estrogen, tell your health care provider if you have a history of: ? Breast cancer. ? Endometrial cancer. ? Blood clots. If you are not sexually active and your symptoms are very mild, you may not need treatment. Follow these instructions at home: Medicines  Take over-the-counter and prescription medicines only as told by your health care provider. Do not use herbal or alternative medicines unless your health care provider says that you can.  Use over-the-counter creams, lubricants, or moisturizers for dryness only as directed by your health care provider. General instructions  If your atrophic vaginitis is caused by menopause, discuss all of your menopause symptoms and treatment options with your health care provider.  Do not douche.  Do not use products that can make your vagina dry. These include: ? Scented feminine sprays. ? Scented tampons. ? Scented soaps.  Vaginal intercourse can help to improve blood flow and elasticity of vaginal tissue. If it hurts to have sex, try using a lubricant or moisturizer just before having intercourse. Contact a health care provider if:  Your discharge looks different than normal.  Your vagina  has an unusual smell.  You have new symptoms.  Your symptoms do not improve with treatment.  Your symptoms get worse. Summary  Atrophic vaginitis is a condition in which the tissues that line the vagina become dry and thin. It is most common in women who have stopped having regular menstrual periods (are in menopause).  Treatment options include using vaginal lubricants and low-dose vaginal estrogen.  Contact a health care provider if your vagina has an unusual smell, or if your symptoms get worse or do not improve after treatment. This information is not intended to replace advice given to you by your health care provider. Make sure you discuss any questions you have with your health care provider. Document Released: 04/18/2015 Document Revised: 11/14/2017 Document Reviewed: 08/28/2017 Elsevier Patient Education  2020 [employer].

## 2019-08-11 ENCOUNTER — Encounter: Payer: Self-pay | Admitting: Internal Medicine

## 2019-08-11 ENCOUNTER — Telehealth: Payer: Self-pay

## 2019-08-11 DIAGNOSIS — R748 Abnormal levels of other serum enzymes: Secondary | ICD-10-CM | POA: Insufficient documentation

## 2019-08-11 DIAGNOSIS — G4700 Insomnia, unspecified: Secondary | ICD-10-CM | POA: Insufficient documentation

## 2019-08-11 DIAGNOSIS — Z803 Family history of malignant neoplasm of breast: Secondary | ICD-10-CM | POA: Insufficient documentation

## 2019-08-11 DIAGNOSIS — N952 Postmenopausal atrophic vaginitis: Secondary | ICD-10-CM | POA: Insufficient documentation

## 2019-08-11 DIAGNOSIS — F419 Anxiety disorder, unspecified: Secondary | ICD-10-CM | POA: Insufficient documentation

## 2019-08-11 DIAGNOSIS — K5909 Other constipation: Secondary | ICD-10-CM | POA: Insufficient documentation

## 2019-08-11 DIAGNOSIS — F5104 Psychophysiologic insomnia: Secondary | ICD-10-CM | POA: Insufficient documentation

## 2019-08-11 DIAGNOSIS — F329 Major depressive disorder, single episode, unspecified: Secondary | ICD-10-CM | POA: Insufficient documentation

## 2019-08-11 DIAGNOSIS — F32A Depression, unspecified: Secondary | ICD-10-CM | POA: Insufficient documentation

## 2019-08-11 NOTE — Telephone Encounter (Signed)
-----   Message from Lucilla Lame, MD sent at 08/10/2019  5:37 PM EDT ----- Let the patient know that her ultrasound of the liver showed her to have some gallstones that do not appear to be causing any problems and the lesion she had on her CT in 2014 has not changed in size.  There is some question about your right kidney for possible kidney disease and I would suggest following up with your primary care provider for that.  As per our discussion since your alkaline phosphatase is not greatly elevated the algorithm suggests further observation.

## 2019-08-11 NOTE — Telephone Encounter (Signed)
LVM for pt to return my call.

## 2019-08-13 NOTE — Telephone Encounter (Signed)
Pt returned my call and was informed of [place] results.

## 2019-08-20 ENCOUNTER — Encounter: Payer: Self-pay | Admitting: *Deleted

## 2019-08-24 ENCOUNTER — Ambulatory Visit
Admission: RE | Admit: 2019-08-24 | Discharge: 2019-08-24 | Disposition: A | Discharge status: Home / Self Care | Payer: Federal, State, Local not specified - PPO | Attending: Gastroenterology | Admitting: Gastroenterology

## 2019-08-24 ENCOUNTER — Encounter
Admission: RE | Disposition: A | Discharge status: Home / Self Care | Payer: Self-pay | Source: Home / Self Care | Attending: Gastroenterology

## 2019-08-24 DIAGNOSIS — D509 Iron deficiency anemia, unspecified: Secondary | ICD-10-CM | POA: Insufficient documentation

## 2019-08-24 HISTORY — PX: GIVENS CAPSULE STUDY: SHX5432

## 2019-08-24 SURGERY — IMAGING PROCEDURE, GI TRACT, INTRALUMINAL, VIA CAPSULE

## 2019-08-25 ENCOUNTER — Ambulatory Visit
Admission: RE | Admit: 2019-08-25 | Discharge: 2019-08-25 | Disposition: A | Discharge status: Home / Self Care | Payer: Federal, State, Local not specified - PPO | Source: Ambulatory Visit | Attending: Family Medicine | Admitting: Family Medicine

## 2019-08-25 ENCOUNTER — Other Ambulatory Visit: Payer: Self-pay

## 2019-08-25 DIAGNOSIS — R922 Inconclusive mammogram: Secondary | ICD-10-CM | POA: Diagnosis not present

## 2019-08-25 DIAGNOSIS — Z1231 Encounter for screening mammogram for malignant neoplasm of breast: Secondary | ICD-10-CM | POA: Insufficient documentation

## 2019-08-25 DIAGNOSIS — N6323 Unspecified lump in the left breast, lower outer quadrant: Secondary | ICD-10-CM | POA: Insufficient documentation

## 2019-08-25 DIAGNOSIS — N6489 Other specified disorders of breast: Secondary | ICD-10-CM | POA: Insufficient documentation

## 2019-08-26 ENCOUNTER — Other Ambulatory Visit: Payer: Self-pay | Admitting: Family Medicine

## 2019-08-26 DIAGNOSIS — N6489 Other specified disorders of breast: Secondary | ICD-10-CM

## 2019-08-26 DIAGNOSIS — N6320 Unspecified lump in the left breast, unspecified quadrant: Secondary | ICD-10-CM

## 2019-08-26 DIAGNOSIS — R928 Other abnormal and inconclusive findings on diagnostic imaging of breast: Secondary | ICD-10-CM

## 2019-09-02 ENCOUNTER — Ambulatory Visit
Admission: RE | Admit: 2019-09-02 | Discharge: 2019-09-02 | Disposition: A | Discharge status: Home / Self Care | Payer: Federal, State, Local not specified - PPO | Source: Ambulatory Visit | Attending: Family Medicine | Admitting: Family Medicine

## 2019-09-02 DIAGNOSIS — R928 Other abnormal and inconclusive findings on diagnostic imaging of breast: Secondary | ICD-10-CM | POA: Diagnosis not present

## 2019-09-02 DIAGNOSIS — N6012 Diffuse cystic mastopathy of left breast: Secondary | ICD-10-CM | POA: Diagnosis not present

## 2019-09-02 DIAGNOSIS — N6489 Other specified disorders of breast: Secondary | ICD-10-CM | POA: Diagnosis not present

## 2019-09-02 DIAGNOSIS — N6320 Unspecified lump in the left breast, unspecified quadrant: Secondary | ICD-10-CM | POA: Diagnosis not present

## 2019-09-02 DIAGNOSIS — N6001 Solitary cyst of right breast: Secondary | ICD-10-CM | POA: Diagnosis not present

## 2019-09-02 DIAGNOSIS — N6002 Solitary cyst of left breast: Secondary | ICD-10-CM | POA: Diagnosis not present

## 2019-09-06 ENCOUNTER — Other Ambulatory Visit: Payer: Federal, State, Local not specified - PPO

## 2019-09-06 ENCOUNTER — Other Ambulatory Visit: Payer: Self-pay | Admitting: Family Medicine

## 2019-09-06 ENCOUNTER — Ambulatory Visit: Payer: Federal, State, Local not specified - PPO

## 2019-09-06 DIAGNOSIS — R928 Other abnormal and inconclusive findings on diagnostic imaging of breast: Secondary | ICD-10-CM

## 2019-09-06 DIAGNOSIS — N6320 Unspecified lump in the left breast, unspecified quadrant: Secondary | ICD-10-CM

## 2019-09-08 ENCOUNTER — Encounter: Payer: Self-pay | Admitting: Gastroenterology

## 2019-09-08 ENCOUNTER — Telehealth: Payer: Self-pay

## 2019-09-08 NOTE — Telephone Encounter (Signed)
The capture study was a normal study of the small bowel. Dr. Vicente Males states to follow up with Dr. Allen Norris in the GI outpatient. Informed patient of this information. She verbalized understanding and states she will call back to scheduled a appointment

## 2019-09-10 ENCOUNTER — Encounter: Payer: Self-pay | Admitting: Internal Medicine

## 2019-09-10 ENCOUNTER — Telehealth: Payer: Self-pay | Admitting: Internal Medicine

## 2019-09-10 ENCOUNTER — Other Ambulatory Visit: Payer: Self-pay

## 2019-09-10 ENCOUNTER — Ambulatory Visit (INDEPENDENT_AMBULATORY_CARE_PROVIDER_SITE_OTHER): Payer: Federal, State, Local not specified - PPO | Admitting: Internal Medicine

## 2019-09-10 DIAGNOSIS — G4700 Insomnia, unspecified: Secondary | ICD-10-CM

## 2019-09-10 DIAGNOSIS — E611 Iron deficiency: Secondary | ICD-10-CM | POA: Diagnosis not present

## 2019-09-10 DIAGNOSIS — E042 Nontoxic multinodular goiter: Secondary | ICD-10-CM | POA: Diagnosis not present

## 2019-09-10 DIAGNOSIS — I10 Essential (primary) hypertension: Secondary | ICD-10-CM | POA: Diagnosis not present

## 2019-09-10 DIAGNOSIS — F419 Anxiety disorder, unspecified: Secondary | ICD-10-CM

## 2019-09-10 DIAGNOSIS — F32A Depression, unspecified: Secondary | ICD-10-CM

## 2019-09-10 DIAGNOSIS — E559 Vitamin D deficiency, unspecified: Secondary | ICD-10-CM | POA: Diagnosis not present

## 2019-09-10 DIAGNOSIS — E785 Hyperlipidemia, unspecified: Secondary | ICD-10-CM | POA: Diagnosis not present

## 2019-09-10 DIAGNOSIS — N6002 Solitary cyst of left breast: Secondary | ICD-10-CM

## 2019-09-10 DIAGNOSIS — F329 Major depressive disorder, single episode, unspecified: Secondary | ICD-10-CM | POA: Diagnosis not present

## 2019-09-10 DIAGNOSIS — Z1329 Encounter for screening for other suspected endocrine disorder: Secondary | ICD-10-CM | POA: Diagnosis not present

## 2019-09-10 DIAGNOSIS — R748 Abnormal levels of other serum enzymes: Secondary | ICD-10-CM | POA: Diagnosis not present

## 2019-09-10 DIAGNOSIS — R739 Hyperglycemia, unspecified: Secondary | ICD-10-CM | POA: Diagnosis not present

## 2019-09-10 MED ORDER — TEMAZEPAM 30 MG PO CAPS: 30.0000 mg | ORAL_CAPSULE | Freq: Every evening | ORAL | 5 refills | Status: DC | PRN

## 2019-09-10 MED ORDER — CITALOPRAM HYDROBROMIDE 10 MG PO TABS: 10.0000 mg | ORAL_TABLET | Freq: Every day | ORAL | 0 refills | Status: DC
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
## 2019-09-10 NOTE — Telephone Encounter (Signed)
Fax letters to 2 #s listed on letter

## 2019-09-10 NOTE — Telephone Encounter (Signed)
Patient is calling to give provider her work Fax [phone] to send the letter she is write out for her.

## 2019-09-11 LAB — COMPREHENSIVE METABOLIC PANEL
ALT: 20 IU/L (ref 0–32)
AST: 19 IU/L (ref 0–40)
Albumin/Globulin Ratio: 1.6 (ref 1.2–2.2)
Albumin: 4.7 g/dL (ref 3.8–4.8)
Alkaline Phosphatase: 167 IU/L — ABNORMAL HIGH (ref 39–117)
BUN/Creatinine Ratio: 15 (ref 12–28)
BUN: 23 mg/dL (ref 8–27)
Bilirubin Total: 0.4 mg/dL (ref 0.0–1.2)
CO2: 26 mmol/L (ref 20–29)
Calcium: 10.3 mg/dL (ref 8.7–10.3)
Chloride: 103 mmol/L (ref 96–106)
Creatinine, Ser: 1.51 mg/dL — ABNORMAL HIGH (ref 0.57–1.00)
GFR calc Af Amer: 42 mL/min/{1.73_m2} — ABNORMAL LOW (ref >59)
GFR calc non Af Amer: 37 mL/min/{1.73_m2} — ABNORMAL LOW (ref >59)
Globulin, Total: 3 g/dL (ref 1.5–4.5)
Glucose: 108 mg/dL — ABNORMAL HIGH (ref 65–99)
Potassium: 4 mmol/L (ref 3.5–5.2)
Sodium: 144 mmol/L (ref 134–144)
Total Protein: 7.7 g/dL (ref 6.0–8.5)

## 2019-09-11 LAB — URINALYSIS, ROUTINE W REFLEX MICROSCOPIC
Bilirubin, UA: NEGATIVE
Glucose, UA: NEGATIVE
Ketones, UA: NEGATIVE
Nitrite, UA: NEGATIVE
Specific Gravity, UA: 1.013 (ref 1.005–1.030)
Urobilinogen, Ur: 0.2 mg/dL (ref 0.2–1.0)
pH, UA: 6 (ref 5.0–7.5)

## 2019-09-11 LAB — CBC WITH DIFFERENTIAL/PLATELET
Basophils Absolute: 0.1 10*3/uL (ref 0.0–0.2)
Basos: 1 %
EOS (ABSOLUTE): 0.4 10*3/uL (ref 0.0–0.4)
Eos: 4 %
Hematocrit: 34.9 % (ref 34.0–46.6)
Hemoglobin: 11.7 g/dL (ref 11.1–15.9)
Immature Grans (Abs): 0 10*3/uL (ref 0.0–0.1)
Immature Granulocytes: 0 %
Lymphocytes Absolute: 2.8 10*3/uL (ref 0.7–3.1)
Lymphs: 32 %
MCH: 29 pg (ref 26.6–33.0)
MCHC: 33.5 g/dL (ref 31.5–35.7)
MCV: 86 fL (ref 79–97)
Monocytes Absolute: 0.7 10*3/uL (ref 0.1–0.9)
Monocytes: 8 %
Neutrophils Absolute: 4.9 10*3/uL (ref 1.4–7.0)
Neutrophils: 55 %
Platelets: 274 10*3/uL (ref 150–450)
RBC: 4.04 x10E6/uL (ref 3.77–5.28)
RDW: 14 % (ref 11.7–15.4)
WBC: 8.9 10*3/uL (ref 3.4–10.8)

## 2019-09-11 LAB — LIPID PANEL
Chol/HDL Ratio: 3.7 ratio (ref 0.0–4.4)
Cholesterol, Total: 228 mg/dL — ABNORMAL HIGH (ref 100–199)
HDL: 62 mg/dL (ref >39)
LDL Chol Calc (NIH): 141 mg/dL — ABNORMAL HIGH (ref 0–99)
Triglycerides: 142 mg/dL (ref 0–149)
VLDL Cholesterol Cal: 25 mg/dL (ref 5–40)

## 2019-09-11 LAB — TSH: TSH: 0.18 u[IU]/mL — ABNORMAL LOW (ref 0.450–4.500)

## 2019-09-11 LAB — IRON,TIBC AND FERRITIN PANEL
Ferritin: 255 ng/mL — ABNORMAL HIGH (ref 15–150)
Iron Saturation: 28 % (ref 15–55)
Iron: 63 ug/dL (ref 27–139)
Total Iron Binding Capacity: 228 ug/dL — ABNORMAL LOW (ref 250–450)
UIBC: 165 ug/dL (ref 118–369)

## 2019-09-11 LAB — MICROSCOPIC EXAMINATION: Casts: NONE SEEN /lpf

## 2019-09-11 LAB — HEMOGLOBIN A1C
Est. average glucose Bld gHb Est-mCnc: 140 mg/dL
Hgb A1c MFr Bld: 6.5 % — ABNORMAL HIGH (ref 4.8–5.6)

## 2019-09-11 LAB — GAMMA GT: GGT: 40 IU/L (ref 0–60)

## 2019-09-11 LAB — HEPATITIS C ANTIBODY: Hep C Virus Ab: <0.1 s/co ratio (ref 0.0–0.9)

## 2019-09-11 LAB — VITAMIN D 25 HYDROXY (VIT D DEFICIENCY, FRACTURES): Vit D, 25-Hydroxy: 23.9 ng/mL — ABNORMAL LOW (ref 30.0–100.0)

## 2019-09-11 LAB — T4, FREE: Free T4: 1.25 ng/dL (ref 0.82–1.77)

## 2019-09-13 ENCOUNTER — Encounter: Payer: Self-pay | Admitting: Internal Medicine

## 2019-09-13 DIAGNOSIS — R319 Hematuria, unspecified: Secondary | ICD-10-CM | POA: Insufficient documentation

## 2019-09-13 DIAGNOSIS — E559 Vitamin D deficiency, unspecified: Secondary | ICD-10-CM | POA: Insufficient documentation

## 2019-09-13 DIAGNOSIS — E119 Type 2 diabetes mellitus without complications: Secondary | ICD-10-CM | POA: Insufficient documentation

## 2019-09-13 DIAGNOSIS — E118 Type 2 diabetes mellitus with unspecified complications: Secondary | ICD-10-CM | POA: Insufficient documentation

## 2019-09-13 DIAGNOSIS — R946 Abnormal results of thyroid function studies: Secondary | ICD-10-CM | POA: Insufficient documentation

## 2019-09-17 ENCOUNTER — Other Ambulatory Visit: Payer: Self-pay | Admitting: Internal Medicine

## 2019-09-17 DIAGNOSIS — E785 Hyperlipidemia, unspecified: Secondary | ICD-10-CM

## 2019-09-17 DIAGNOSIS — I10 Essential (primary) hypertension: Secondary | ICD-10-CM

## 2019-09-17 DIAGNOSIS — N1832 Chronic kidney disease, stage 3b: Secondary | ICD-10-CM

## 2019-09-17 DIAGNOSIS — E042 Nontoxic multinodular goiter: Secondary | ICD-10-CM

## 2019-09-17 DIAGNOSIS — E041 Nontoxic single thyroid nodule: Secondary | ICD-10-CM

## 2019-09-17 DIAGNOSIS — R946 Abnormal results of thyroid function studies: Secondary | ICD-10-CM

## 2019-09-17 MED ORDER — ATORVASTATIN CALCIUM 10 MG PO TABS: 10.0000 mg | ORAL_TABLET | Freq: Every day | ORAL | 3 refills | Status: DC

## 2019-09-17 MED ORDER — AMLODIPINE BESYLATE 2.5 MG PO TABS: 2.5000 mg | ORAL_TABLET | Freq: Every day | ORAL | 3 refills | Status: DC

## 2019-09-17 MED ORDER — HYDROCHLOROTHIAZIDE 25 MG PO TABS: 12.5000 mg | ORAL_TABLET | Freq: Every day | ORAL | 3 refills | Status: DC

## 2019-09-22 ENCOUNTER — Encounter: Payer: Self-pay | Admitting: Internal Medicine

## 2019-09-28 ENCOUNTER — Other Ambulatory Visit: Payer: Self-pay | Admitting: Internal Medicine

## 2019-09-28 ENCOUNTER — Ambulatory Visit: Payer: Self-pay | Admitting: Internal Medicine

## 2019-09-28 DIAGNOSIS — R0602 Shortness of breath: Secondary | ICD-10-CM

## 2019-09-28 NOTE — Telephone Encounter (Signed)
Pt reports mild SOB last week "A few days." States this am after walking up and down stairs at home mild SOB, resolved with rest. Denies CP, tightness, no wheezing, afebrile. Speech non halting during call. States "Better now." States she started amlodipine 09/17/2019 and she has noted intermittent SOB since that time."I think it's this new medication." States checked BP yesterday "Good." Has not checked this AM, at work. Attempted to reach practice for consideration of virtual appt as pt would like to discuss with Dr. Jacklynn Lewis. Call on hold extended time, pt at work. Assured pt [place] would route to practice for Dr. Junious Dresser review. Care advise given per protocol. Advised ED if SOB worsens. Pt verbalizes understanding.  CB# [phone]  Reason for Disposition . [1] MILD difficulty breathing (e.g., minimal/no SOB at rest, SOB with walking, pulse <100) AND [2] NEW-onset or WORSE than normal  Answer Assessment - Initial Assessment Questions 1. RESPIRATORY STATUS: "Describe your breathing?" (e.g., wheezing, shortness of breath, unable to speak, severe coughing)      SOB with exertion only 2. ONSET: "When did this breathing problem begin?"      Last week, "Few days last week" 3. PATTERN "Does the difficult breathing come and go, or has it been constant since it started?"      With exertion, "Better now" 4. SEVERITY: "How bad is your breathing?" (e.g., mild, moderate, severe)    - MILD: No SOB at rest, mild SOB with walking, speaks normally in sentences, can lay down, no retractions, pulse < 100.    - MODERATE: SOB at rest, SOB with minimal exertion and prefers to sit, cannot lie down flat, speaks in phrases, mild retractions, audible wheezing, pulse 100-120.    - SEVERE: Very SOB at rest, speaks in single words, struggling to breathe, sitting hunched forward, retractions, pulse > 120      mild 5. RECURRENT SYMPTOM: "Have you had difficulty breathing before?" If so, ask: "When was the last time?"  and "What happened that time?"      no 6. CARDIAC HISTORY: "Do you have any history of heart disease?" (e.g., heart attack, angina, bypass surgery, angioplasty)      no 7. LUNG HISTORY: "Do you have any history of lung disease?"  (e.g., pulmonary embolus, asthma, emphysema)     no 8. CAUSE: "What do you think is causing the breathing problem?"     New medication ( amlodipine) 9. OTHER SYMPTOMS: "Do you have any other symptoms? (e.g., dizziness, runny nose, cough, chest pain, fever)    none  Protocols used: BREATHING DIFFICULTY-A-AH

## 2019-09-28 NOTE — Telephone Encounter (Signed)
What new medication is she thinking is causing sob How is her anxiety?  Any chest pain?  What has her bp been?

## 2019-09-28 NOTE — Telephone Encounter (Signed)
Blood pressure goal should be <130/<80  Ordered Chest Xray [facility] outpatient imaging have pt go  If sob not better rec move appt sooner than 10/15/2019 with me or go to ED or urgent care  -schedule if needed while pt on the phone   [place]

## 2019-09-28 NOTE — Telephone Encounter (Signed)
Unknown of medication that may be causing SOB. Anxiety has been been pretty good. No chest px. BP Saturday 134/89.

## 2019-10-01 NOTE — Telephone Encounter (Signed)
tms

## 2019-10-01 NOTE — Telephone Encounter (Signed)
Left message for patient to return call back. [facility] may give information.  

## 2019-10-05 ENCOUNTER — Ambulatory Visit
Admission: RE | Admit: 2019-10-05 | Discharge: 2019-10-05 | Disposition: A | Discharge status: Home / Self Care | Payer: Federal, State, Local not specified - PPO | Source: Ambulatory Visit | Attending: Internal Medicine | Admitting: Internal Medicine

## 2019-10-05 ENCOUNTER — Other Ambulatory Visit: Payer: Self-pay

## 2019-10-05 DIAGNOSIS — R946 Abnormal results of thyroid function studies: Secondary | ICD-10-CM | POA: Insufficient documentation

## 2019-10-05 DIAGNOSIS — N183 Chronic kidney disease, stage 3 unspecified: Secondary | ICD-10-CM | POA: Diagnosis not present

## 2019-10-05 DIAGNOSIS — E041 Nontoxic single thyroid nodule: Secondary | ICD-10-CM | POA: Diagnosis not present

## 2019-10-05 DIAGNOSIS — N1832 Chronic kidney disease, stage 3b: Secondary | ICD-10-CM

## 2019-10-05 DIAGNOSIS — E042 Nontoxic multinodular goiter: Secondary | ICD-10-CM | POA: Insufficient documentation

## 2019-10-06 ENCOUNTER — Telehealth: Payer: Self-pay | Admitting: Internal Medicine

## 2019-10-06 NOTE — Telephone Encounter (Signed)
Kendra Rhodes called from [facility] ([facility]) radiology call report impression copied below results in chart.  IMPRESSION: 1. 2.3 cm solid lesion within the posterior bladder lumen, concerning for possible mass. Urologic referral and further imaging with cross-sectional imaging recommended for further evaluation. 2. Increased echogenicity within the kidneys bilaterally, compatible with medical renal disease. 3. Few small left renal cysts as above. 4. No hydronephrosis.

## 2019-10-06 NOTE — Telephone Encounter (Signed)
Please see result note 

## 2019-10-06 NOTE — Telephone Encounter (Signed)
Call pt 

## 2019-10-13 ENCOUNTER — Other Ambulatory Visit: Payer: Self-pay

## 2019-10-14 ENCOUNTER — Encounter: Payer: Self-pay | Admitting: Internal Medicine

## 2019-10-14 ENCOUNTER — Ambulatory Visit: Payer: Federal, State, Local not specified - PPO | Admitting: Internal Medicine

## 2019-10-14 ENCOUNTER — Other Ambulatory Visit: Payer: Self-pay

## 2019-10-14 VITALS — BP 132/78 | HR 74 | Temp 97.0°F | Ht 66.0 in | Wt 189.6 lb

## 2019-10-14 DIAGNOSIS — N1832 Chronic kidney disease, stage 3b: Secondary | ICD-10-CM

## 2019-10-14 DIAGNOSIS — Z0000 Encounter for general adult medical examination without abnormal findings: Secondary | ICD-10-CM

## 2019-10-14 DIAGNOSIS — F32A Depression, unspecified: Secondary | ICD-10-CM

## 2019-10-14 DIAGNOSIS — I10 Essential (primary) hypertension: Secondary | ICD-10-CM

## 2019-10-14 DIAGNOSIS — E042 Nontoxic multinodular goiter: Secondary | ICD-10-CM | POA: Diagnosis not present

## 2019-10-14 DIAGNOSIS — E041 Nontoxic single thyroid nodule: Secondary | ICD-10-CM

## 2019-10-14 DIAGNOSIS — F329 Major depressive disorder, single episode, unspecified: Secondary | ICD-10-CM

## 2019-10-14 DIAGNOSIS — N3289 Other specified disorders of bladder: Secondary | ICD-10-CM | POA: Insufficient documentation

## 2019-10-14 DIAGNOSIS — E785 Hyperlipidemia, unspecified: Secondary | ICD-10-CM

## 2019-10-14 DIAGNOSIS — G4700 Insomnia, unspecified: Secondary | ICD-10-CM

## 2019-10-14 DIAGNOSIS — N183 Chronic kidney disease, stage 3 unspecified: Secondary | ICD-10-CM | POA: Insufficient documentation

## 2019-10-14 DIAGNOSIS — E119 Type 2 diabetes mellitus without complications: Secondary | ICD-10-CM

## 2019-10-14 DIAGNOSIS — N6002 Solitary cyst of left breast: Secondary | ICD-10-CM

## 2019-10-14 DIAGNOSIS — F419 Anxiety disorder, unspecified: Secondary | ICD-10-CM

## 2019-10-14 DIAGNOSIS — N6001 Solitary cyst of right breast: Secondary | ICD-10-CM

## 2019-10-14 MED ORDER — AMLODIPINE BESYLATE 5 MG PO TABS: 5.0000 mg | ORAL_TABLET | Freq: Every day | ORAL | 3 refills | Status: DC

## 2019-10-14 NOTE — Progress Notes (Signed)
Chief Complaint  Patient presents with  . Follow-up   Annual  1. Anxiety and sleep doing better on celexa 10 and restoril 30 mg qhs job going better and in office location she likes  2. CKD 3 with slight decline in GFR drinking 32 oz of water US renal + Medical renal disease  3. HTN improved on hctz 12.5 mg qd and norvasc 2.5 will increase to 5 mg daily  4. Thyroid US 5.9 left thyroid nodules meets criteria for bx appt endocrine 11/10/19  5. Bladder mass 2.3 needs further w/u referral to urology pending  6. Right breast cysts and left breast cluster of cysts pending repeat Mammo 12/2019  7. DM 2 A1C 6.5 wants to try healthy diet and exercise for now    Review of Systems  Constitutional: Negative for weight loss.  HENT: Negative for hearing loss.   Eyes: Negative for blurred vision.  Respiratory: Negative for shortness of breath.   Cardiovascular: Negative for chest pain.  Gastrointestinal: Negative for abdominal pain.  Musculoskeletal: Negative for falls.  Skin: Negative for rash.  Neurological: Negative for headaches.  Psychiatric/Behavioral: Negative for depression.   Past Medical History:  Diagnosis Date  . Depression   . Hypertension   . Sickle cell trait ([place])   . Wears contact lenses   . Wears dentures    upper and lower partials, also permanent retainer   Past Surgical History:  Procedure Laterality Date  . ABDOMINAL HYSTERECTOMY     for prolapse per pt with mesh ovaries still intact no h/o abnormal pap  . COLONOSCOPY WITH PROPOFOL N/A 06/10/2019   Procedure: COLONOSCOPY WITH PROPOFOL;  Surgeon: Lucilla Lame, MD;  Location: [facility];  Service: Endoscopy;  Laterality: N/A;  . ESOPHAGOGASTRODUODENOSCOPY (EGD) WITH PROPOFOL N/A 06/10/2019   Procedure: ESOPHAGOGASTRODUODENOSCOPY (EGD) WITH PROPOFOL;  Surgeon: Lucilla Lame, MD;  Location: [facility];  Service: Endoscopy;  Laterality: N/A;  . EYE SURGERY     right eye left eye pending needs done as of  08/10/19   . GIVENS CAPSULE STUDY N/A 08/24/2019   Procedure: GIVENS CAPSULE STUDY;  Surgeon: Lucilla Lame, MD;  Location: [facility] END[facility]COPY;  Service: Endoscopy;  Laterality: N/A;  . POLYPECTOMY  06/10/2019   Procedure: POLYPECTOMY;  Surgeon: Lucilla Lame, MD;  Location: [facility];  Service: Endoscopy;;   Family History  Problem Relation Age of Onset  . Breast cancer Mother 51  . Diabetes Mother   . Kidney failure Father   . Alcohol abuse Father   . Heart disease Father        chf  . Hyperlipidemia Father   . Other Other        fatty liver cousin  . Sickle cell anemia Other        nephew  . Cancer Other        aunt with ovarian cancer  . Lung cancer Maternal Aunt    Social History   Socioeconomic History  . Marital status: Divorced    Spouse name: Not on file  . Number of children: Not on file  . Years of education: Not on file  . Highest education level: Not on file  Occupational History  . Not on file  Social Needs  . Financial resource strain: Not on file  . Food insecurity    Worry: Not on file    Inability: Not on file  . Transportation needs    Medical: Not on file    Non-medical: Not on file  Tobacco Use  . Smoking status: Never Smoker  . Smokeless tobacco: Never Used  Substance and Sexual Activity  . Alcohol use: Never    Frequency: Never  . Drug use: Never  . Sexual activity: Yes  Lifestyle  . Physical activity    Days per week: Not on file    Minutes per session: Not on file  . Stress: Not on file  Relationships  . Social Herbalist on phone: Not on file    Gets together: Not on file    Attends religious service: Not on file    Active member of club or organization: Not on file    Attends meetings of clubs or organizations: Not on file    Relationship status: Not on file  . Intimate partner violence    Fear of current or ex partner: Not on file    Emotionally abused: Not on file    Physically abused: Not on file    Forced  sexual activity: Not on file  Other Topics Concern  . Not on file  Social History Narrative   [employer] [place]    2 kids daughter Corky Sing and son Llesuer Marcello Moores   Works homeless shelter weekends    Current Meds  Medication Sig  . amLODipine (NORVASC) 5 MG tablet Take 1 tablet (5 mg total) by mouth daily.  Marland Kitchen atenolol (TENORMIN) 25 MG tablet atenolol 25 mg tablet  . atorvastatin (LIPITOR) 10 MG tablet Take 1 tablet (10 mg total) by mouth daily at 6 PM.  . azelastine (ASTELIN) 0.1 % nasal spray Place into the nose.  . citalopram (CELEXA) 10 MG tablet Take 1-2 tablets (10-20 mg total) by mouth daily. X 1 week and increase to 2 pills after week 2  . ezetimibe (ZETIA) 10 MG tablet   . fluticasone (FLONASE) 50 MCG/ACT nasal spray fluticasone propionate 50 mcg/actuation nasal spray,suspension  . hydrochlorothiazide (HYDRODIURIL) 25 MG tablet Take 0.5 tablets (12.5 mg total) by mouth daily. In am  . montelukast (SINGULAIR) 10 MG tablet montelukast 10 mg tablet  . temazepam (RESTORIL) 30 MG capsule Take 1 capsule (30 mg total) by mouth at bedtime as needed for sleep.  . TRULANCE 3 MG TABS   . [DISCONTINUED] amLODipine (NORVASC) 2.5 MG tablet Take 1 tablet (2.5 mg total) by mouth daily.   Allergies  Allergen Reactions  . Ambien [Zolpidem Tartrate]     Nightmares   . Aspirin Other (See Comments)  . Shellfish Allergy Itching  . Strawberry Extract   . Sulfa Antibiotics Other (See Comments), Itching and Swelling  . Wheat Bran   . Prednisone Palpitations   Recent Results (from the past 2160 hour(s))  Hepatic function panel     Status: Abnormal   Collection Time: 07/29/19  8:14 AM  Result Value Ref Range   Total Protein 7.0 6.0 - 8.5 g/dL   Albumin 4.4 3.8 - 4.8 g/dL   Bilirubin Total 0.4 0.0 - 1.2 mg/dL   Bilirubin, Direct 0.12 0.00 - 0.40 mg/dL   Alkaline Phosphatase 146 (H) 39 - 117 IU/L   AST 16 0 - 40 IU/L   ALT 17 0 - 32 IU/L  Comprehensive metabolic panel      Status: Abnormal   Collection Time: 09/10/19  9:52 AM  Result Value Ref Range   Glucose 108 (H) 65 - 99 mg/dL   BUN 23 8 - 27 mg/dL   Creatinine, Ser 1.51 (H) 0.57 - 1.00 mg/dL  GFR calc non Af Amer 37 (L) >59 mL/min/1.73   GFR calc Af Amer 42 (L) >59 mL/min/1.73   BUN/Creatinine Ratio 15 12 - 28   Sodium 144 134 - 144 mmol/L   Potassium 4.0 3.5 - 5.2 mmol/L   Chloride 103 96 - 106 mmol/L   CO2 26 20 - 29 mmol/L   Calcium 10.3 8.7 - 10.3 mg/dL   Total Protein 7.7 6.0 - 8.5 g/dL   Albumin 4.7 3.8 - 4.8 g/dL   Globulin, Total 3.0 1.5 - 4.5 g/dL   Albumin/Globulin Ratio 1.6 1.2 - 2.2   Bilirubin Total 0.4 0.0 - 1.2 mg/dL   Alkaline Phosphatase 167 (H) 39 - 117 IU/L   AST 19 0 - 40 IU/L   ALT 20 0 - 32 IU/L  CBC with Differential/Platelet     Status: None   Collection Time: 09/10/19  9:52 AM  Result Value Ref Range   WBC 8.9 3.4 - 10.8 x10E3/uL   RBC 4.04 3.77 - 5.28 x10E6/uL   Hemoglobin 11.7 11.1 - 15.9 g/dL   Hematocrit 34.9 34.0 - 46.6 %   MCV 86 79 - 97 fL   MCH 29.0 26.6 - 33.0 pg   MCHC 33.5 31.5 - 35.7 g/dL   RDW 14.0 11.7 - 15.4 %   Platelets 274 150 - 450 x10E3/uL   Neutrophils 55 Not Estab. %   Lymphs 32 Not Estab. %   Monocytes 8 Not Estab. %   Eos 4 Not Estab. %   Basos 1 Not Estab. %   Neutrophils Absolute 4.9 1.4 - 7.0 x10E3/uL   Lymphocytes Absolute 2.8 0.7 - 3.1 x10E3/uL   Monocytes Absolute 0.7 0.1 - 0.9 x10E3/uL   EOS (ABSOLUTE) 0.4 0.0 - 0.4 x10E3/uL   Basophils Absolute 0.1 0.0 - 0.2 x10E3/uL   Immature Granulocytes 0 Not Estab. %   Immature Grans (Abs) 0.0 0.0 - 0.1 x10E3/uL  Lipid panel     Status: Abnormal   Collection Time: 09/10/19  9:52 AM  Result Value Ref Range   Cholesterol, Total 228 (H) 100 - 199 mg/dL   Triglycerides 142 0 - 149 mg/dL   HDL 62 >39 mg/dL   VLDL Cholesterol Cal 25 5 - 40 mg/dL   LDL Chol Calc (NIH) 141 (H) 0 - 99 mg/dL   Chol/HDL Ratio 3.7 0.0 - 4.4 ratio    Comment:                                   T. Chol/HDL  Ratio                                             Men  Women                               1/2 Avg.Risk  3.4    3.3                                   Avg.Risk  5.0    4.4  2X Avg.Risk  9.6    7.1                                3X Avg.Risk 23.4   11.0   TSH     Status: Abnormal   Collection Time: 09/10/19  9:52 AM  Result Value Ref Range   TSH 0.180 (L) 0.450 - 4.500 uIU/mL  T4, free     Status: None   Collection Time: 09/10/19  9:52 AM  Result Value Ref Range   Free T4 1.25 0.82 - 1.77 ng/dL  Urinalysis, Routine w reflex microscopic     Status: Abnormal   Collection Time: 09/10/19  9:52 AM  Result Value Ref Range   Specific Gravity, UA 1.013 1.005 - 1.030   pH, UA 6.0 5.0 - 7.5   Color, UA Yellow Yellow   Appearance Ur Clear Clear   Leukocytes,UA Trace (A) Negative   Protein,UA 1+ (A) Negative/Trace   Glucose, UA Negative Negative   Ketones, UA Negative Negative   RBC, UA 1+ (A) Negative   Bilirubin, UA Negative Negative   Urobilinogen, Ur 0.2 0.2 - 1.0 mg/dL   Nitrite, UA Negative Negative   Microscopic Examination See below:     Comment: Microscopic was indicated and was performed.  Iron, TIBC and Ferritin Panel     Status: Abnormal   Collection Time: 09/10/19  9:52 AM  Result Value Ref Range   Total Iron Binding Capacity 228 (L) 250 - 450 ug/dL   UIBC 165 118 - 369 ug/dL   Iron 63 27 - 139 ug/dL   Iron Saturation 28 15 - 55 %   Ferritin 255 (H) 15 - 150 ng/mL  Gamma GT     Status: None   Collection Time: 09/10/19  9:52 AM  Result Value Ref Range   GGT 40 0 - 60 IU/L  Vitamin D (25 hydroxy)     Status: Abnormal   Collection Time: 09/10/19  9:52 AM  Result Value Ref Range   Vit D, 25-Hydroxy 23.9 (L) 30.0 - 100.0 ng/mL    Comment: Vitamin D deficiency has been defined by the Institute of Medicine and an Endocrine Society practice guideline as a level of serum 25-OH vitamin D less than 20 ng/mL (1,2). The Endocrine Society went on to  further define vitamin D insufficiency as a level between 21 and 29 ng/mL (2). 1. IOM (Institute of Medicine). 2010. Dietary reference    intakes for calcium and D. [place]: The    [employer]. 2. Holick MF, Binkley [place], Bischoff-Ferrari HA, et al.    Evaluation, treatment, and prevention of vitamin D    deficiency: an Endocrine Society clinical practice    guideline. JCEM. 2011 Jul; 96(7):1911-30.   Hemoglobin A1c     Status: Abnormal   Collection Time: 09/10/19  9:52 AM  Result Value Ref Range   Hgb A1c MFr Bld 6.5 (H) 4.8 - 5.6 %    Comment:          Prediabetes: 5.7 - 6.4          Diabetes: >6.4          Glycemic control for adults with diabetes: <7.0    Est. average glucose Bld gHb Est-mCnc 140 mg/dL  Hepatitis C antibody     Status: None   Collection Time: 09/10/19  9:52 AM  Result Value Ref Range   Hep C Virus  Ab <0.1 0.0 - 0.9 s/co ratio    Comment:                                   Negative:     < 0.8                              Indeterminate: 0.8 - 0.9                                   Positive:     > 0.9  The CDC recommends that a positive HCV antibody result  be followed up with a HCV Nucleic Acid Amplification  test [id number]).   Microscopic Examination     Status: Abnormal   Collection Time: 09/10/19  9:52 AM  Result Value Ref Range   WBC, UA 0-5 0 - 5 /hpf   RBC 3-10 (A) 0 - 2 /hpf   Epithelial Cells (non renal) 0-10 0 - 10 /hpf   Casts None seen None seen /lpf   Mucus, UA Present Not Estab.   Bacteria, UA Few None seen/Few   Objective  Body mass index is 30.6 kg/m. Wt Readings from Last 3 Encounters:  10/14/19 189 lb 9.6 oz (86 kg)  08/10/19 187 lb 9.6 oz (85.1 kg)  08/02/19 196 lb (88.9 kg)   Temp Readings from Last 3 Encounters:  10/14/19 (!) 97 F (36.1 C) (Oral)  08/10/19 98.8 F (37.1 C) (Oral)  08/02/19 (!) 96.8 F (36 C) (Oral)   BP Readings from Last 3 Encounters:  10/14/19 132/78  08/10/19 128/78  08/02/19 120/80    Pulse Readings from Last 3 Encounters:  10/14/19 74  08/10/19 76  08/02/19 (!) 103    Physical Exam Vitals signs and nursing note reviewed.  Constitutional:      Appearance: Normal appearance. She is well-developed and well-groomed. She is obese.     Comments: +mask on    HENT:     Head: Normocephalic and atraumatic.  Eyes:     Conjunctiva/sclera: Conjunctivae normal.     Pupils: Pupils are equal, round, and reactive to light.  Cardiovascular:     Rate and Rhythm: Normal rate and regular rhythm.     Heart sounds: Normal heart sounds. No murmur.  Pulmonary:     Effort: Pulmonary effort is normal.     Breath sounds: Normal breath sounds.  Abdominal:     General: Abdomen is flat. Bowel sounds are normal.     Tenderness: There is no abdominal tenderness.  Skin:    General: Skin is warm and dry.  Neurological:     General: No focal deficit present.     Mental Status: She is alert and oriented to person, place, and time. Mental status is at baseline.     Gait: Gait normal.  Psychiatric:        Attention and Perception: Attention and perception normal.        Mood and Affect: Mood and affect normal.        Speech: Speech normal.        Behavior: Behavior normal. Behavior is cooperative.        Thought Content: Thought content normal.        Cognition and Memory: Cognition and memory normal.  Judgment: Judgment normal.     Assessment  Plan  Annual physical exam Flu utd 2020  Requested records of vaccines from prior PCP Consider Tdap and shingrix  mamo sch 09/02/19 [place] 09/02/19  -left breast cysts f/u US and dx mammogram due in 6 months ordered   S/p hysterectomy no h/o abnormal pap, ovaries intact consider pelvic in future  Dexa consider age 46  colonoscopy Dr. Allen Norris 06/10/19/egd chronic gastritis, tubular adenoma f/u in 5 years rec healthy diet and exercise   Essential hypertension with CKD 3 b- Plan: amLODipine (NORVASC) 5 MG tablet increase from 2.5  mg qd hctz 12.5 mg qd  Monitor renal function control BP and see in 3 months  Multinodular goiter with left thyroid nodule and low TSH  -endocrine appt 11/10/19 needs bx thyroid nodule  Hyperlipidemia, unspecified hyperlipidemia type -rec zetia and lipitor 10 and healthy diet and exercise   Anxiety and depression Insomnia, unspecified type -celexa and restoril working   Type 2 diabetes mellitus without complication, without Cutter-term current use of insulin ([place]) -wants to try diet 1st before meds  Repeat at f/u   Bladder mass Urology referral  Bilateral breast cysts  -mammogram sch 12/2019 left dx mammo and [place]    Provider: Dr. Olivia Mackie McLean-[facility]oc[facility]

## 2019-10-14 NOTE — Patient Instructions (Addendum)
D3 2000 IU daily  A1C 5.6 no prediabetes 6.5 and higher diabetes  The next 56 days online nutrition program Xyzal for allergies 5 mg daily at night     Cholesterol Content in Foods Cholesterol is a waxy, fat-like substance that helps to carry fat in the blood. The body needs cholesterol in small amounts, but too much cholesterol can cause damage to the arteries and heart. Most people should eat less than 200 milligrams (mg) of cholesterol a day. Foods with cholesterol  Cholesterol is found in animal-based foods, such as meat, seafood, and dairy. Generally, low-fat dairy and lean meats have less cholesterol than full-fat dairy and fatty meats. The milligrams of cholesterol per serving (mg per serving) of common cholesterol-containing foods are listed below. Meat and other proteins  Egg -- one large whole egg has 186 mg.  Veal shank -- 4 oz has 141 mg.  Lean ground [place] (93% lean) -- 4 oz has 118 mg.  Fat-trimmed lamb loin -- 4 oz has 106 mg.  Lean ground beef (90% lean) -- 4 oz has 100 mg.  Lobster -- 3.5 oz has 90 mg.  Pork loin chops -- 4 oz has 86 mg.  Canned salmon -- 3.5 oz has 83 mg.  Fat-trimmed beef top loin -- 4 oz has 78 mg.  Frankfurter -- 1 frank (3.5 oz) has 77 mg.  Crab -- 3.5 oz has 71 mg.  Roasted chicken without skin, white meat -- 4 oz has 66 mg.  Light bologna -- 2 oz has 45 mg.  Deli-cut [place] -- 2 oz has 31 mg.  Canned tuna -- 3.5 oz has 31 mg.  Berniece Salines -- 1 oz has 29 mg.  Oysters and mussels (raw) -- 3.5 oz has 25 mg.  Mackerel -- 1 oz has 22 mg.  Trout -- 1 oz has 20 mg.  Pork sausage -- 1 link (1 oz) has 17 mg.  Salmon -- 1 oz has 16 mg.  Tilapia -- 1 oz has 14 mg. Dairy  Soft-serve ice cream --  cup (4 oz) has 103 mg.  Whole-milk yogurt -- 1 cup (8 oz) has 29 mg.  Cheddar cheese -- 1 oz has 28 mg.  American cheese -- 1 oz has 28 mg.  Whole milk -- 1 cup (8 oz) has 23 mg.  2% milk -- 1 cup (8 oz) has 18 mg.  Cream cheese  -- 1 tablespoon (Tbsp) has 15 mg.  Cottage cheese --  cup (4 oz) has 14 mg.  Low-fat (1%) milk -- 1 cup (8 oz) has 10 mg.  Sour cream -- 1 Tbsp has 8.5 mg.  Low-fat yogurt -- 1 cup (8 oz) has 8 mg.  Nonfat Greek yogurt -- 1 cup (8 oz) has 7 mg.  Half-and-half cream -- 1 Tbsp has 5 mg. Fats and oils  Cod liver oil -- 1 tablespoon (Tbsp) has 82 mg.  Butter -- 1 Tbsp has 15 mg.  Lard -- 1 Tbsp has 14 mg.  Bacon grease -- 1 Tbsp has 14 mg.  Mayonnaise -- 1 Tbsp has 5-10 mg.  Margarine -- 1 Tbsp has 3-10 mg. Exact amounts of cholesterol in these foods may vary depending on specific ingredients and brands. Foods without cholesterol Most plant-based foods do not have cholesterol unless you combine them with a food that has cholesterol. Foods without cholesterol include:  Grains and cereals.  Vegetables.  Fruits.  Vegetable oils, such as olive, canola, and sunflower oil.  Legumes, such as  peas, beans, and lentils.  Nuts and seeds.  Egg whites. Summary  The body needs cholesterol in small amounts, but too much cholesterol can cause damage to the arteries and heart.  Most people should eat less than 200 milligrams (mg) of cholesterol a day. This information is not intended to replace advice given to you by your health care provider. Make sure you discuss any questions you have with your health care provider. Document Released: 07/29/2017 Document Revised: 11/14/2017 Document Reviewed: 07/29/2017 Elsevier Patient Education  [place].  High Cholesterol  High cholesterol is a condition in which the blood has high levels of a white, waxy, fat-like substance (cholesterol). The human body needs small amounts of cholesterol. The liver makes all the cholesterol that the body needs. Extra (excess) cholesterol comes from the food that we eat. Cholesterol is carried from the liver by the blood through the blood vessels. If you have high cholesterol, deposits (plaques) may  build up on the walls of your blood vessels (arteries). Plaques make the arteries narrower and stiffer. Cholesterol plaques increase your risk for heart attack and stroke. Work with your health care provider to keep your cholesterol levels in a healthy range. What increases the risk? This condition is more likely to develop in people who:  Eat foods that are high in animal fat (saturated fat) or cholesterol.  Are overweight.  Are not getting enough exercise.  Have a family history of high cholesterol. What are the signs or symptoms? There are no symptoms of this condition. How is this diagnosed? This condition may be diagnosed from the results of a blood test.  If you are older than age 78, your health care provider may check your cholesterol every 4-6 years.  You may be checked more often if you already have high cholesterol or other risk factors for heart disease. The blood test for cholesterol measures:  "Bad" cholesterol (LDL cholesterol). This is the main type of cholesterol that causes heart disease. The desired level for LDL is less than 100.  "Good" cholesterol (HDL cholesterol). This type helps to protect against heart disease by cleaning the arteries and carrying the LDL away. The desired level for HDL is 60 or higher.  Triglycerides. These are fats that the body can store or burn for energy. The desired number for triglycerides is lower than 150.  Total cholesterol. This is a measure of the total amount of cholesterol in your blood, including LDL cholesterol, HDL cholesterol, and triglycerides. A healthy number is less than 200. How is this treated? This condition is treated with diet changes, lifestyle changes, and medicines. Diet changes  This may include eating more whole grains, fruits, vegetables, nuts, and fish.  This may also include cutting back on red meat and foods that have a lot of added sugar. Lifestyle changes  Changes may include getting at least 40  minutes of aerobic exercise 3 times a week. Aerobic exercises include walking, biking, and swimming. Aerobic exercise along with a healthy diet can help you maintain a healthy weight.  Changes may also include quitting smoking. Medicines  Medicines are usually given if diet and lifestyle changes have failed to reduce your cholesterol to healthy levels.  Your health care provider may prescribe a statin medicine. Statin medicines have been shown to reduce cholesterol, which can reduce the risk of heart disease. Follow these instructions at home: Eating and drinking If told by your health care provider:  Eat chicken (without skin), fish, veal, shellfish, ground [place]  breast, and round or loin cuts of red meat.  Do not eat fried foods or fatty meats, such as hot dogs and salami.  Eat plenty of fruits, such as apples.  Eat plenty of vegetables, such as broccoli, potatoes, and carrots.  Eat beans, peas, and lentils.  Eat grains such as barley, rice, couscous, and bulgur wheat.  Eat pasta without cream sauces.  Use skim or nonfat milk, and eat low-fat or nonfat yogurt and cheeses.  Do not eat or drink whole milk, cream, ice cream, egg yolks, or hard cheeses.  Do not eat stick margarine or tub margarines that contain trans fats (also called partially hydrogenated oils).  Do not eat saturated tropical oils, such as coconut oil and palm oil.  Do not eat cakes, cookies, crackers, or other baked goods that contain trans fats.  General instructions  Exercise as directed by your health care provider. Increase your activity level with activities such as gardening, walking, and taking the stairs.  Take over-the-counter and prescription medicines only as told by your health care provider.  Do not use any products that contain nicotine or tobacco, such as cigarettes and e-cigarettes. If you need help quitting, ask your health care provider.  Keep all follow-up visits as told by your health  care provider. This is important. Contact a health care provider if:  You are struggling to maintain a healthy diet or weight.  You need help to start on an exercise program.  You need help to stop smoking. Get help right away if:  You have chest pain.  You have trouble breathing. This information is not intended to replace advice given to you by your health care provider. Make sure you discuss any questions you have with your health care provider. Document Released: 12/02/2005 Document Revised: 12/05/2017 Document Reviewed: 06/01/2016 Elsevier Patient Education  [place].    Diabetes Mellitus and Nutrition, Adult When you have diabetes (diabetes mellitus), it is very important to have healthy eating habits because your blood sugar (glucose) levels are greatly affected by what you eat and drink. Eating healthy foods in the appropriate amounts, at about the same times every day, can help you:  Control your blood glucose.  Lower your risk of heart disease.  Improve your blood pressure.  Reach or maintain a healthy weight. Every person with diabetes is different, and each person has different needs for a meal plan. Your health care provider may recommend that you work with a diet and nutrition specialist (dietitian) to make a meal plan that is best for you. Your meal plan may vary depending on factors such as:  The calories you need.  The medicines you take.  Your weight.  Your blood glucose, blood pressure, and cholesterol levels.  Your activity level.  Other health conditions you have, such as heart or kidney disease. How do carbohydrates affect me? Carbohydrates, also called carbs, affect your blood glucose level more than any other type of food. Eating carbs naturally raises the amount of glucose in your blood. Carb counting is a method for keeping track of how many carbs you eat. Counting carbs is important to keep your blood glucose at a healthy level,  especially if you use insulin or take certain oral diabetes medicines. It is important to know how many carbs you can safely have in each meal. This is different for every person. Your dietitian can help you calculate how many carbs you should have at each meal and for each snack. Foods  that contain carbs include:  Bread, cereal, rice, pasta, and crackers.  Potatoes and corn.  Peas, beans, and lentils.  Milk and yogurt.  Fruit and juice.  Desserts, such as cakes, cookies, ice cream, and candy. How does alcohol affect me? Alcohol can cause a sudden decrease in blood glucose (hypoglycemia), especially if you use insulin or take certain oral diabetes medicines. Hypoglycemia can be a life-threatening condition. Symptoms of hypoglycemia (sleepiness, dizziness, and confusion) are similar to symptoms of having too much alcohol. If your health care provider says that alcohol is safe for you, follow these guidelines:  Limit alcohol intake to no more than 1 drink per day for nonpregnant women and 2 drinks per day for men. One drink equals 12 oz of beer, 5 oz of wine, or 1 oz of hard liquor.  Do not drink on an empty stomach.  Keep yourself hydrated with water, diet soda, or unsweetened iced tea.  Keep in mind that regular soda, juice, and other mixers may contain a lot of sugar and must be counted as carbs. What are tips for following this plan?  Reading food labels  Start by checking the serving size on the "Nutrition Facts" label of packaged foods and drinks. The amount of calories, carbs, fats, and other nutrients listed on the label is based on one serving of the item. Many items contain more than one serving per package.  Check the total grams (g) of carbs in one serving. You can calculate the number of servings of carbs in one serving by dividing the total carbs by 15. For example, if a food has 30 g of total carbs, it would be equal to 2 servings of carbs.  Check the number of grams  (g) of saturated and trans fats in one serving. Choose foods that have low or no amount of these fats.  Check the number of milligrams (mg) of salt (sodium) in one serving. Most people should limit total sodium intake to less than 2,300 mg per day.  Always check the nutrition information of foods labeled as "low-fat" or "nonfat". These foods may be higher in added sugar or refined carbs and should be avoided.  Talk to your dietitian to identify your daily goals for nutrients listed on the label. Shopping  Avoid buying canned, premade, or processed foods. These foods tend to be high in fat, sodium, and added sugar.  Shop around the outside edge of the grocery store. This includes fresh fruits and vegetables, bulk grains, fresh meats, and fresh dairy. Cooking  Use low-heat cooking methods, such as baking, instead of high-heat cooking methods like deep frying.  Cook using healthy oils, such as olive, canola, or sunflower oil.  Avoid cooking with butter, cream, or high-fat meats. Meal planning  Eat meals and snacks regularly, preferably at the same times every day. Avoid going [place] periods of time without eating.  Eat foods high in fiber, such as fresh fruits, vegetables, beans, and whole grains. Talk to your dietitian about how many servings of carbs you can eat at each meal.  Eat 4-6 ounces (oz) of lean protein each day, such as lean meat, chicken, fish, eggs, or tofu. One oz of lean protein is equal to: ? 1 oz of meat, chicken, or fish. ? 1 egg. ?  cup of tofu.  Eat some foods each day that contain healthy fats, such as avocado, nuts, seeds, and fish. Lifestyle  Check your blood glucose regularly.  Exercise regularly as told by your health  care provider. This may include: ? 150 minutes of moderate-intensity or vigorous-intensity exercise each week. This could be brisk walking, biking, or water aerobics. ? Stretching and doing strength exercises, such as yoga or weightlifting, at  least 2 times a week.  Take medicines as told by your health care provider.  Do not use any products that contain nicotine or tobacco, such as cigarettes and e-cigarettes. If you need help quitting, ask your health care provider.  Work with a Social worker or diabetes educator to identify strategies to manage stress and any emotional and social challenges. Questions to ask a health care provider  Do I need to meet with a diabetes educator?  Do I need to meet with a dietitian?  What number can I call if I have questions?  When are the best times to check my blood glucose? Where to find more information:  American Diabetes Association: diabetes.org  Academy of Nutrition and Dietetics: www.eatright.[employer] of Diabetes and Digestive and Kidney Diseases (NIH): [employer] Summary  A healthy meal plan will help you control your blood glucose and maintain a healthy lifestyle.  Working with a diet and nutrition specialist (dietitian) can help you make a meal plan that is best for you.  Keep in mind that carbohydrates (carbs) and alcohol have immediate effects on your blood glucose levels. It is important to count carbs and to use alcohol carefully. This information is not intended to replace advice given to you by your health care provider. Make sure you discuss any questions you have with your health care provider. Document Released: 08/29/2005 Document Revised: 11/14/2017 Document Reviewed: 01/06/2017 Elsevier Patient Education  2020 [employer].

## 2019-10-15 ENCOUNTER — Ambulatory Visit: Payer: Federal, State, Local not specified - PPO | Admitting: Internal Medicine

## 2019-10-17 DIAGNOSIS — N63 Unspecified lump in unspecified breast: Secondary | ICD-10-CM

## 2019-10-17 HISTORY — DX: Unspecified lump in unspecified breast: N63.0

## 2019-11-02 ENCOUNTER — Other Ambulatory Visit: Payer: Self-pay

## 2019-11-02 ENCOUNTER — Encounter: Payer: Self-pay | Admitting: Urology

## 2019-11-02 ENCOUNTER — Ambulatory Visit: Payer: Federal, State, Local not specified - PPO | Admitting: Urology

## 2019-11-02 VITALS — BP 146/95 | HR 82 | Ht 66.0 in | Wt 189.0 lb

## 2019-11-02 DIAGNOSIS — R3129 Other microscopic hematuria: Secondary | ICD-10-CM | POA: Diagnosis not present

## 2019-11-02 MED ORDER — LIDOCAINE HCL URETHRAL/MUCOSAL 2 % EX GEL
1.0000 "application " | Freq: Once | CUTANEOUS | Status: AC
  Administered 2019-11-02: 1 via URETHRAL

## 2019-11-02 NOTE — Patient Instructions (Signed)
Bladder Biopsy  A bladder biopsy is a procedure to remove a small sample of tissue from the bladder. The procedure is done so that the tissue can be examined under a microscope. You may have a bladder biopsy to diagnose or rule out bladder cancer. During your bladder biopsy, your health care provider may insert a Egger, thin scope with a tiny lighted camera (cystoscope) into the tube that leads from your bladder to the outside of your body (urethra) and move it into your bladder. The cystoscope will allow your health care provider to examine the lining of the urethra and bladder and remove the tissue sample. If your health provider also needs to examine the tubes that connect the kidneys to the bladder (ureters), a longer tube (ureteroscope) may be used. Tell a health care provider about:  Any allergies you have.  All medicines you are taking, including vitamins, herbs, eye drops, creams, and over-the-counter medicines.  Any problems you or family members have had with anesthetic medicines.  Any blood disorders you have.  Any surgeries you have had.  Any medical conditions you have.  Whether you are pregnant or may be pregnant. What are the risks? Generally, this is a safe procedure. However, problems may occur, including:  Unusual bleeding.  Infection, especially a urinary tract infection (UTI).  Allergic reactions to medicines.  Damage to the surrounding organs, including the urethra, bladder, or ureters.  Abdominal pain.  Burning or pain during urination.  Narrowing of the urethra due to scar tissue.  Difficulty urinating due to swelling. What happens before the procedure?  You may be given antibiotic medicine to help prevent infection.  Ask your health care provider about: ? Changing or stopping your regular medicines. This is especially important if you are taking diabetes medicines or blood thinners. ? Taking medicines such as aspirin and ibuprofen. These medicines can  thin your blood. Do not take these medicines before your procedure if your health care provider instructs you not to.  Follow instructions from your health care provider about eating and drinking restrictions.  You may be asked to drink plenty of fluids.  You may be asked to urinate right before the procedure. You may have a urine sample taken for UTI testing.  Plan to have someone take you home from the hospital or clinic.  If you will be going home right after the procedure, plan to have someone with you for 24 hours. What happens during the procedure?  To lower your risk of infection: ? Your health care team will wash or sanitize their hands. ? Your skin will be washed with soap.  An IV tube may be inserted into one of your veins.  You may be given one or more of the following: ? A medicine to help you relax (sedative). ? A medicine to numb the opening of the urethra (local anesthetic). ? A medicine to make you fall asleep (general anesthetic). ? A medicine that is injected into your spine to numb the area below and slightly above the injection site (spinal anesthetic). ? A medicine that is injected into an area of your body to numb everything below the injection site (regional anesthetic).  You will lie on your back with your knees bent and spread apart.  The cystoscope or ureteroscope will be inserted into your urethra and guided into your bladder or ureters.  Your bladder may be slowly filled with germ-free (sterile) water. This will make it easier for your health care provider to view  the wall or lining of your bladder.  Small instruments will be inserted through the scope to collect a small tissue sample that will be examined under a microscope. The procedure may vary among health care providers and hospitals. What happens after the procedure?  You may be asked to empty your bladder, or your bladder may be emptied for you.  Your blood pressure, heart rate, breathing  rate, and blood oxygen level will be monitored until the medicines you were given wear off.  Do not drive for 24 hours if you received a sedative. This information is not intended to replace advice given to you by your health care provider. Make sure you discuss any questions you have with your health care provider. Document Released: 12/19/2015 Document Revised: 05/09/2016 Document Reviewed: 12/19/2015 Elsevier Patient Education  [place].   Bladder Biopsy, Care After Refer to this sheet in the next few weeks. These instructions provide you with information about caring for yourself after your procedure. Your health care provider may also give you more specific instructions. Your treatment has been planned according to current medical practices, but problems sometimes occur. Call your health care provider if you have any problems or questions after your procedure. What can I expect after the procedure? After the procedure, it is common to have:  Mild pain in your bladder or kidney area during urination.  Minor burning during urination.  Small amounts of blood in your urine.  A sudden urge to urinate.  A need to urinate more often than usual. Follow these instructions at home: Medicines  Take over-the-counter and prescription medicines only as told by your health care provider.  If you were prescribed an antibiotic medicine, take it as told by your health care provider. Do not stop taking the antibiotic even if you start to feel better. General instructions   Take a warm bath to relieve any burning sensations around your urethra.  Hold a warm, damp washcloth over the urethral area to ease pain.  Return to your normal activities as told by your health care provider. Ask your health care provider what activities are safe for you.  Do not drive for 24 hours if you received a medicine to help you relax (sedative) during your procedure. Ask your health care provider when it  is safe for you to drive.  It is your responsibility to get the results of your procedure. Ask your health care provider or the department performing the procedure when your results will be ready.  Keep all follow-up visits as told by your health care provider. This is important. Contact a health care provider if:  You have a fever.  Your symptoms do not improve within 24 hours and you continue to have: ? Burning during urination. ? Increasing amounts of blood in your urine. ? Pain during urination. ? An urgent need to urinate. ? A need to urinate more often than usual. Get help right away if:  You have a lot of bleeding or more bleeding.  You have severe pain.  You are unable to urinate.  You have bright red blood in your urine.  You are passing blood clots in your urine.  You have a fever.  You have swelling, redness, or pain in your legs.  You have difficulty breathing. This information is not intended to replace advice given to you by your health care provider. Make sure you discuss any questions you have with your health care provider. Document Released: 12/19/2015 Document Revised: 05/09/2016  Document Reviewed: 12/19/2015 Elsevier Patient Education  [employer].

## 2019-11-02 NOTE — Progress Notes (Signed)
11/02/19 10:29 AM   Kendra Rhodes [birth year] [id number]  Referring provider: McLean-Scocuzza, Nino Glow, MD [place],  [place] [address]  CC: Bladder mass  HPI: I saw Kendra Rhodes in urology clinic in consultation from Dr. Caroline Sauger for a bladder mass.  She is a 63 year old African-American female with history of depression, hypertension, sickle cell trait, and pre-diabetes who underwent a renal ultrasound on 10/06/2019 for CKD that showed normal kidneys and no hydronephrosis, but a possible 2 cm posterior wall bladder mass.  She denies any gross hematuria, urinary symptoms, or weight loss.  She is a never smoker and works at the [employer].  She denies any other carcinogenic exposures.  There are no aggravating or alleviating factors.  Severity is moderate.  She has a recent history of microscopic hematuria with 3-10 RBCs per high-power field in September 2020.  PMH: Past Medical History:  Diagnosis Date   Depression    Hypertension    Sickle cell trait ([place])    Wears contact lenses    Wears dentures    upper and lower partials, also permanent retainer    Surgical History: Past Surgical History:  Procedure Laterality Date   ABDOMINAL HYSTERECTOMY     for prolapse per pt with mesh ovaries still intact no h/o abnormal pap   COLONOSCOPY WITH PROPOFOL N/A 06/10/2019   Procedure: COLONOSCOPY WITH PROPOFOL;  Surgeon: Lucilla Lame, MD;  Location: [facility];  Service: Endoscopy;  Laterality: N/A;   ESOPHAGOGASTRODUODENOSCOPY (EGD) WITH PROPOFOL N/A 06/10/2019   Procedure: ESOPHAGOGASTRODUODENOSCOPY (EGD) WITH PROPOFOL;  Surgeon: Lucilla Lame, MD;  Location: [facility];  Service: Endoscopy;  Laterality: N/A;   EYE SURGERY     right eye left eye pending needs done as of 08/10/19    GIVENS CAPSULE STUDY N/A 08/24/2019   Procedure: GIVENS CAPSULE STUDY;  Surgeon: Lucilla Lame, MD;  Location: [facility] ENDOSCOPY;  Service: Endoscopy;  Laterality: N/A;    POLYPECTOMY  06/10/2019   Procedure: POLYPECTOMY;  Surgeon: Lucilla Lame, MD;  Location: [facility];  Service: Endoscopy;;    Allergies:  Allergies  Allergen Reactions   Ambien [Zolpidem Tartrate]     Nightmares    Aspirin Other (See Comments)   Shellfish Allergy Itching   Strawberry Extract    Sulfa Antibiotics Other (See Comments), Itching and Swelling   Wheat Bran    Prednisone Palpitations    Family History: Family History  Problem Relation Age of Onset   Breast cancer Mother 47   Diabetes Mother    Kidney failure Father    Alcohol abuse Father    Heart disease Father        chf   Hyperlipidemia Father    Other Other        fatty liver cousin   Sickle cell anemia Other        nephew   Cancer Other        aunt with ovarian cancer   Lung cancer Maternal Aunt     Social History:  reports that she has never smoked. She has never used smokeless tobacco. She reports that she does not drink alcohol or use drugs.  ROS: Please see flowsheet from today's date for complete review of systems.  Physical Exam: BP (!) 146/95 (BP Location: Left Arm, Patient Position: Sitting, Cuff Size: Normal)    Pulse 82    Ht 5\' 6"  (1.676 m)    Wt 189 lb (85.7 kg)    BMI 30.51 kg/m  Constitutional:  Alert and oriented, No acute distress. Cardiovascular: No clubbing, cyanosis, or edema. Respiratory: Normal respiratory effort, no increased work of breathing. GI: Abdomen is soft, nontender, nondistended, no abdominal masses Lymph: No cervical or inguinal lymphadenopathy. Skin: No rashes, bruises or suspicious lesions. Neurologic: Grossly intact, no focal deficits, moving all 4 extremities. Psychiatric: Normal mood and affect.  Cystoscopy Procedure Note:  Indication: Possible bladder mass on ultrasound  After informed consent and discussion of the procedure and its risks, Kendra Rhodes was positioned and prepped in the standard fashion. Cystoscopy was performed  with a flexible cystoscope. The urethra, bladder neck and entire bladder was visualized in a standard fashion.  Urine was slightly cloudy which limited vision.  I did not visualize any obvious bladder tumors, however there was some erythema at the posterior bladder wall.  Retroflexion showed no abnormalities at the bladder base.  Assessment & Plan:   In summary, she is a 63 year old female with a possible 2 cm solid bladder mass seen on ultrasound.  Her cystoscopy in clinic today showed some erythema at the posterior bladder wall.  We discussed possible etiologies including inflammation versus bladder tumor/malignancy.  I recommended further work-up with CT urogram to complete her microscopic hematuria evaluation, as well as bladder biopsy of the abnormal bladder lesion in the operating room.  We discussed cystoscopy and bladder biopsy and risks and benefits at length. This is typically a 1 hour procedure done under general anesthesia in the operating room.  A scope is inserted through the urethra and used to remove abnormal tissue within the bladder, which is then sent to the pathologist to determine possible grade and stage of the tumor.  Risks include bleeding, infection, need for temporary Foley placement, and bladder perforation.  Treatment strategies are based on the type of tumor and depth of invasion.   -CT urogram to complete microscopic hematuria work-up -We will schedule cystoscopy and bladder biopsy in the operating room  A total of 40 minutes were spent face-to-face with the patient, greater than 50% was spent in patient education, counseling, and coordination of care regarding bladder lesion seen on ultrasound and further work-up.   Billey Co, [facility] [address], [place], [place] [address] [phone]

## 2019-11-03 ENCOUNTER — Other Ambulatory Visit: Payer: Self-pay | Admitting: Radiology

## 2019-11-03 ENCOUNTER — Telehealth: Payer: Self-pay | Admitting: Radiology

## 2019-11-03 DIAGNOSIS — N3289 Other specified disorders of bladder: Secondary | ICD-10-CM

## 2019-11-03 DIAGNOSIS — R3129 Other microscopic hematuria: Secondary | ICD-10-CM

## 2019-11-03 DIAGNOSIS — H2513 Age-related nuclear cataract, bilateral: Secondary | ICD-10-CM | POA: Diagnosis not present

## 2019-11-03 DIAGNOSIS — H52213 Irregular astigmatism, bilateral: Secondary | ICD-10-CM | POA: Diagnosis not present

## 2019-11-03 DIAGNOSIS — H11053 Peripheral pterygium, progressive, bilateral: Secondary | ICD-10-CM | POA: Diagnosis not present

## 2019-11-03 NOTE — Telephone Encounter (Signed)
LMOM to return call. Need to discuss surgery with Dr Diamantina Providence.

## 2019-11-08 ENCOUNTER — Other Ambulatory Visit: Payer: Self-pay

## 2019-11-08 DIAGNOSIS — Z20822 Contact with and (suspected) exposure to covid-19: Secondary | ICD-10-CM

## 2019-11-09 LAB — NOVEL CORONAVIRUS, NAA: SARS-CoV-2, NAA: NOT DETECTED

## 2019-11-10 NOTE — Telephone Encounter (Signed)
This encounter was created in error - please disregard.

## 2019-11-16 DIAGNOSIS — N3289 Other specified disorders of bladder: Secondary | ICD-10-CM

## 2019-11-16 HISTORY — DX: Other specified disorders of bladder: N32.89

## 2019-11-18 ENCOUNTER — Other Ambulatory Visit: Payer: Self-pay | Admitting: Internal Medicine

## 2019-11-18 ENCOUNTER — Telehealth: Payer: Self-pay | Admitting: Internal Medicine

## 2019-11-18 DIAGNOSIS — J309 Allergic rhinitis, unspecified: Secondary | ICD-10-CM

## 2019-11-18 MED ORDER — MONTELUKAST SODIUM 10 MG PO TABS: 10.0000 mg | ORAL_TABLET | Freq: Every day | ORAL | 3 refills | Status: DC

## 2019-11-18 NOTE — Telephone Encounter (Signed)
Medication Refill - Medication:  montelukast (SINGULAIR) 10 MG tablet    Has the patient contacted their pharmacy? Yes advised to call office.   Preferred Pharmacy (with phone number or street name):  [facility] [id number] - [place], [place] [address] AT [address] [phone] (Phone) [phone] (Fax)     Agent: Please be advised that RX refills may take up to 3 business days. We ask that you follow-up with your pharmacy.

## 2019-11-19 ENCOUNTER — Other Ambulatory Visit: Payer: Self-pay

## 2019-11-19 DIAGNOSIS — R3129 Other microscopic hematuria: Secondary | ICD-10-CM

## 2019-11-22 ENCOUNTER — Ambulatory Visit
Admission: RE | Admit: 2019-11-22 | Discharge: 2019-11-22 | Disposition: A | Discharge status: Home / Self Care | Payer: Federal, State, Local not specified - PPO | Source: Ambulatory Visit | Attending: Urology | Admitting: Urology

## 2019-11-22 ENCOUNTER — Other Ambulatory Visit
Admission: RE | Admit: 2019-11-22 | Discharge: 2019-11-22 | Disposition: A | Discharge status: Home / Self Care | Payer: Federal, State, Local not specified - PPO | Source: Home / Self Care | Attending: Urology | Admitting: Urology

## 2019-11-22 ENCOUNTER — Other Ambulatory Visit: Payer: Self-pay

## 2019-11-22 DIAGNOSIS — R3129 Other microscopic hematuria: Secondary | ICD-10-CM

## 2019-11-22 DIAGNOSIS — K862 Cyst of pancreas: Secondary | ICD-10-CM | POA: Diagnosis not present

## 2019-11-22 LAB — CREATININE, SERUM
Creatinine, Ser: 1.18 mg/dL — ABNORMAL HIGH (ref 0.44–1.00)
GFR calc Af Amer: 57 mL/min — ABNORMAL LOW (ref >60)
GFR calc non Af Amer: 49 mL/min — ABNORMAL LOW (ref >60)

## 2019-11-22 MED ORDER — IOHEXOL 300 MG/ML  SOLN
125.0000 mL | Freq: Once | INTRAMUSCULAR | Status: AC | PRN
  Administered 2019-11-22: 125 mL via INTRAVENOUS

## 2019-11-23 ENCOUNTER — Encounter: Payer: Self-pay | Admitting: *Deleted

## 2019-11-23 ENCOUNTER — Inpatient Hospital Stay
Admission: RE | Admit: 2019-11-23 | Discharge: 2019-11-23 | Disposition: A | Discharge status: Home / Self Care | Payer: Federal, State, Local not specified - PPO | Source: Ambulatory Visit

## 2019-11-23 NOTE — Pre-Procedure Instructions (Signed)
Telephone call placed to patient. Left voicemail to call back to P.A.T. to update her medical history. Patient will need to have an EKG done on day of her Covid Swab, November 30, 2019

## 2019-11-23 NOTE — Patient Instructions (Signed)
INSTRUCTIONS FOR SURGERY     Your surgery is scheduled for:   Friday, December 18TH     To find out your arrival time for the day of surgery,          please call [phone] between 1 pm and 3 pm on :  Thursday, December 17TH     When you arrive for surgery, report to the [facility].       Do NOT stop on the first floor to register.    REMEMBER: Instructions that are not followed completely may result in serious medical risk,  up to and including death, or upon the discretion of your surgeon and anesthesiologist,            your surgery may need to be rescheduled.  __X__ 1. Do not eat food after midnight the night before your procedure.                    No gum, candy, lozenger, tic tacs, tums or hard candies.                  ABSOLUTELY NOTHING SOLID IN YOUR MOUTH AFTER MIDNIGHT                    You may drink unlimited clear liquids up to 2 hours before you are scheduled to arrive for surgery.                   Do not drink anything within those 2 hours unless you need to take medicine, then take the                   smallest amount you need.  Clear liquids include:  water, apple juice without pulp,                   any flavor Gatorade, Black coffee, black tea.  Sugar may be added but no dairy/ honey /lemon.                        Broth and jello is not considered a clear liquid.  __x__  2. On the morning of surgery, please brush your teeth with toothpaste and water. You may rinse with                  mouthwash if you wish but DO NOT SWALLOW TOOTHPASTE OR MOUTHWASH  __X___3. NO alcohol for 24 hours before or after surgery.  __x___ 4.  Do NOT smoke or use e-cigarettes for 24 HOURS PRIOR TO SURGERY.                      DO NOT  Use any chewable tobacco products for at least 6 hours prior to surgery.  __x___ 5. If you start any new medication after this appointment and prior to surgery, please            Bring it with you on the day of surgery.  ___x__ 6. Notify your doctor if there is any change in your medical condition, such as fever, infection,  vomitting,                   Diarrhea or any open sores.  __x___ 7.  USE ANTIBACTERIAL SOAP as instructed, the night before surgery and the day of surgery.                   Once you have washed with this soap, do NOT use any of the following: Powders, perfumes                    or lotions. Please do not wear make up, hairpins, clips or nail polish. You MAY wear deodorant.                   Men may shave their face and neck.  Women need to shave 48 hours prior to surgery.                   DO NOT wear ANY jewelry on the day of surgery. If there are rings that are too tight to                    remove easily, please address this prior to the surgery day. Piercings need to be removed.                                                                     NO METAL ON YOUR BODY.                    Do NOT bring any valuables.  If you came to Pre-Admit testing then you will not need license,                     insurance card or credit card.  If you will be staying overnight, please either leave your things in                     the car or have your family be responsible for these items.                     [facility] IS NOT RESPONSIBLE FOR BELONGINGS OR VALUABLES.  ___X__ 8. DO NOT wear contact lenses on surgery day.  You may not have dentures,                     Hearing aides, contacts or glasses in the operating room. These items can be                    Placed in the Recovery Room to receive immediately after surgery.  __x___ 9. IF YOU ARE SCHEDULED TO GO HOME ON THE SAME DAY, YOU MUST                   Have someone to drive you home and to stay with you  for the first 24 hours.                    Have an arrangement prior to arriving on surgery day.  ___x__ 10. Take the following medications on the morning of surgery with a sip of  water:                              1.  AMLODIPINE                     2.  ATENOLOL                     3.  CELEXA                     4.  ZETIA (if normally taken in the morning)                     5.                     6.  _____ 11.  Follow any instructions provided to you by your surgeon.                        Such as enema, clear liquid bowel prep  __X__  12. STOP all aspirin products as of Friday, December 11th                       THIS INCLUDES BC POWDERS / GOODIES POWDER  __x___ 13. STOP Anti-inflammatories as of: Friday, December 11th                      This includes IBUPROFEN / MOTRIN / ADVIL / ALEVE/ NAPROXYN                    YOU MAY TAKE TYLENOL ANY TIME PRIOR TO SURGERY.  _____ 14.  Stop supplements until after surgery.                     This includes: N/A                 You may continue taking Vitamin B12 / Vitamin D3 but do not take on the morning of surgery.  _____ 15. Bring your CPAP machine into preop with you on the morning of surgery.  ___X___17.  Continue to take the following medications but do not take on the morning of surgery:                           HYDROCHLOROTHIAZIDE // VITAMIN D  ______18. If staying overnight, please have appropriate shoes to wear to be able to walk around the unit.                   Wear clean and comfortable clothing to the hospital.  CONTINUE TAKING YOUR NIGHT TIME MEDICATIONS AS USUAL. HAVE PHONE NUMBERS WITH YOU FOR YOUR DRIVER AND PERSON FOR MD TO UPDATE.  COVID TESTING 11/30/19.  YOU WILL NEED TO COME INSIDE PREADMIT TESTING FOR AN EKG.

## 2019-11-24 ENCOUNTER — Other Ambulatory Visit: Payer: Self-pay

## 2019-11-24 ENCOUNTER — Encounter
Admission: RE | Admit: 2019-11-24 | Discharge: 2019-11-24 | Disposition: A | Discharge status: Home / Self Care | Payer: Federal, State, Local not specified - PPO | Source: Ambulatory Visit | Attending: Urology | Admitting: Urology

## 2019-11-24 ENCOUNTER — Other Ambulatory Visit: Payer: Self-pay | Admitting: Radiology

## 2019-11-24 HISTORY — DX: Prediabetes: R73.03

## 2019-11-24 NOTE — Patient Instructions (Addendum)
Your procedure is scheduled on: Friday 12/18 Report to Day Surgery.  Medical Mall To find out your arrival time please call [phone] between 1PM - 3PM on Thurs 12/17.  Remember: Instructions that are not followed completely may result in serious medical risk,  up to and including death, or upon the discretion of your surgeon and anesthesiologist your  surgery may need to be rescheduled.     _X__ 1. Do not eat food after midnight the night before your procedure.                 No gum chewing or hard candies. You may drink clear liquids up to 2 hours                 before you are scheduled to arrive for your surgery- DO not drink clear                 liquids within 2 hours of the start of your surgery.                 Clear Liquids include:  water, G2, Black Coffee or Tea (Do not add                 anything to coffee or tea).  __X__2.  On the morning of surgery brush your teeth with toothpaste and water, you                may rinse your mouth with mouthwash if you wish.  Do not swallow any toothpaste of mouthwash.       _ 3.  No Alcohol for 24 hours before or after surgery.   ___ 4.  Do Not Smoke or use e-cigarettes For 24 Hours Prior to Your Surgery.                 Do not use any chewable tobacco products for at least 6 hours prior to                 surgery.  ____  5.  Bring all medications with you on the day of surgery if instructed.   _x___  6.  Notify your doctor if there is any change in your medical condition      (cold, fever, infections).     Do not wear jewelry, make-up, hairpins, clips or nail polish. Do not wear lotions, powders, or perfumes. You may wear deodorant. Do not shave 48 hours prior to surgery. Men may shave face and neck. Do not bring valuables to the hospital.    [facility] is not responsible for any belongings or valuables.  Contacts, dentures or bridgework may not be worn into surgery. Leave your suitcase in the car.  After surgery it may be brought to your room. For patients admitted to the hospital, discharge time is determined by your treatment team.   Patients discharged the day of surgery will not be allowed to drive home.   Please read over the following fact sheets that you were given:    __x__ Take these medicines the morning of surgery with A SIP OF WATER:    1. amLODipine (NORVASC) 5 MG tablet  2. atenolol (TENORMIN) 25 MG tablet  3. ezetimibe (ZETIA) 10 MG tablet  4.  5.  6.  ____ Fleet Enema (as directed)   _x___ shower the night before and morning of surgery.    ____ Use inhalers on the day of surgery  ____ Stop metformin  2 days prior to surgery    ____ Take 1/2 of usual insulin dose the night before surgery. No insulin the morning          of surgery.   ____ Stop Coumadin/Plavix/aspirin   ___x_ Stop Anti-inflammatories No ibuprofen aleve or aspirin  May take tylenol   ____ Stop supplements until after surgery.    ____ Bring C-Pap to the hospital.

## 2019-11-26 ENCOUNTER — Other Ambulatory Visit: Payer: Self-pay

## 2019-11-26 ENCOUNTER — Other Ambulatory Visit
Admission: RE | Admit: 2019-11-26 | Discharge: 2019-11-26 | Disposition: A | Discharge status: Home / Self Care | Payer: Federal, State, Local not specified - PPO | Attending: Urology | Admitting: Urology

## 2019-11-26 DIAGNOSIS — R3129 Other microscopic hematuria: Secondary | ICD-10-CM | POA: Diagnosis not present

## 2019-11-26 LAB — URINALYSIS, COMPLETE (UACMP) WITH MICROSCOPIC
Bilirubin Urine: NEGATIVE
Glucose, UA: NEGATIVE mg/dL
Ketones, ur: NEGATIVE mg/dL
Nitrite: POSITIVE — AB
Specific Gravity, Urine: 1.015 (ref 1.005–1.030)
pH: 6 (ref 5.0–8.0)

## 2019-11-28 LAB — URINE CULTURE: Culture: >=100000 — AB

## 2019-11-29 ENCOUNTER — Telehealth: Payer: Self-pay | Admitting: Radiology

## 2019-11-29 DIAGNOSIS — N390 Urinary tract infection, site not specified: Secondary | ICD-10-CM

## 2019-11-29 DIAGNOSIS — R319 Hematuria, unspecified: Secondary | ICD-10-CM

## 2019-11-29 DIAGNOSIS — R3129 Other microscopic hematuria: Secondary | ICD-10-CM

## 2019-11-29 DIAGNOSIS — N3289 Other specified disorders of bladder: Secondary | ICD-10-CM

## 2019-11-29 MED ORDER — NITROFURANTOIN MONOHYD MACRO 100 MG PO CAPS: 100.0000 mg | ORAL_CAPSULE | Freq: Two times a day (BID) | ORAL | 0 refills | Status: DC

## 2019-11-29 NOTE — Telephone Encounter (Signed)
Notified patient of script sent to pharmacy. 

## 2019-11-29 NOTE — Telephone Encounter (Signed)
-----   Message from Billey Co, MD sent at 11/28/2019  1:16 PM EST ----- Please start nitrofurantoin 100mg  BID x 5 days to sterilize urine before cysto/biopsy this Friday, thanks  Nickolas Madrid, MD 11/28/2019

## 2019-11-30 ENCOUNTER — Other Ambulatory Visit: Payer: Self-pay

## 2019-11-30 ENCOUNTER — Other Ambulatory Visit
Admission: RE | Admit: 2019-11-30 | Discharge: 2019-11-30 | Disposition: A | Discharge status: Home / Self Care | Payer: Federal, State, Local not specified - PPO | Source: Ambulatory Visit | Attending: Urology | Admitting: Urology

## 2019-11-30 ENCOUNTER — Encounter
Admission: RE | Admit: 2019-11-30 | Discharge: 2019-11-30 | Disposition: A | Discharge status: Home / Self Care | Payer: Federal, State, Local not specified - PPO | Source: Ambulatory Visit | Attending: Urology | Admitting: Urology

## 2019-11-30 DIAGNOSIS — Z20828 Contact with and (suspected) exposure to other viral communicable diseases: Secondary | ICD-10-CM | POA: Diagnosis not present

## 2019-11-30 DIAGNOSIS — Z01812 Encounter for preprocedural laboratory examination: Secondary | ICD-10-CM | POA: Insufficient documentation

## 2019-11-30 LAB — SARS CORONAVIRUS 2 (TAT 6-24 HRS): SARS Coronavirus 2: NEGATIVE

## 2019-12-03 ENCOUNTER — Ambulatory Visit: Payer: Federal, State, Local not specified - PPO | Admitting: Anesthesiology

## 2019-12-03 ENCOUNTER — Encounter
Admission: RE | Disposition: A | Discharge status: Home / Self Care | Payer: Self-pay | Source: Home / Self Care | Attending: Urology

## 2019-12-03 ENCOUNTER — Encounter: Payer: Self-pay | Admitting: Urology

## 2019-12-03 ENCOUNTER — Ambulatory Visit
Admission: RE | Admit: 2019-12-03 | Discharge: 2019-12-03 | Disposition: A | Discharge status: Home / Self Care | Payer: Federal, State, Local not specified - PPO | Attending: Urology | Admitting: Urology

## 2019-12-03 DIAGNOSIS — N3021 Other chronic cystitis with hematuria: Secondary | ICD-10-CM | POA: Insufficient documentation

## 2019-12-03 DIAGNOSIS — R3129 Other microscopic hematuria: Secondary | ICD-10-CM

## 2019-12-03 DIAGNOSIS — N8110 Cystocele, unspecified: Secondary | ICD-10-CM | POA: Diagnosis not present

## 2019-12-03 DIAGNOSIS — E1122 Type 2 diabetes mellitus with diabetic chronic kidney disease: Secondary | ICD-10-CM | POA: Insufficient documentation

## 2019-12-03 DIAGNOSIS — N183 Chronic kidney disease, stage 3 unspecified: Secondary | ICD-10-CM | POA: Diagnosis not present

## 2019-12-03 DIAGNOSIS — N3289 Other specified disorders of bladder: Secondary | ICD-10-CM | POA: Diagnosis not present

## 2019-12-03 DIAGNOSIS — I129 Hypertensive chronic kidney disease with stage 1 through stage 4 chronic kidney disease, or unspecified chronic kidney disease: Secondary | ICD-10-CM | POA: Insufficient documentation

## 2019-12-03 DIAGNOSIS — R3121 Asymptomatic microscopic hematuria: Secondary | ICD-10-CM | POA: Diagnosis not present

## 2019-12-03 DIAGNOSIS — D573 Sickle-cell trait: Secondary | ICD-10-CM | POA: Diagnosis not present

## 2019-12-03 HISTORY — DX: Anemia, unspecified: D64.9

## 2019-12-03 HISTORY — DX: Insomnia, unspecified: G47.00

## 2019-12-03 HISTORY — DX: Anxiety disorder, unspecified: F41.9

## 2019-12-03 HISTORY — DX: Benign neoplasm of colon, unspecified: D12.6

## 2019-12-03 HISTORY — DX: Nontoxic goiter, unspecified: E04.9

## 2019-12-03 HISTORY — PX: CYSTOSCOPY WITH BIOPSY: SHX5122

## 2019-12-03 HISTORY — DX: Chronic kidney disease, stage 3 unspecified: N18.30

## 2019-12-03 HISTORY — PX: CYSTOSCOPY WITH FULGERATION: SHX6638

## 2019-12-03 LAB — GLUCOSE, CAPILLARY
Glucose-Capillary: 93 mg/dL (ref 70–99)
Glucose-Capillary: 113 mg/dL — ABNORMAL HIGH (ref 70–99)

## 2019-12-03 SURGERY — CYSTOSCOPY, WITH BIOPSY
Anesthesia: General | Site: Bladder

## 2019-12-03 MED ORDER — OXYCODONE HCL 5 MG PO TABS: 5.0000 mg | ORAL_TABLET | Freq: Once | ORAL | Status: AC | PRN

## 2019-12-03 MED ORDER — PROPOFOL 10 MG/ML IV BOLUS
INTRAVENOUS | Status: DC | PRN
  Administered 2019-12-03: 170 mg via INTRAVENOUS
  Administered 2019-12-03: 30 mg via INTRAVENOUS

## 2019-12-03 MED ORDER — LACTATED RINGERS IV SOLN: INTRAVENOUS | Status: DC

## 2019-12-03 MED ORDER — FAMOTIDINE 20 MG PO TABS
ORAL_TABLET | ORAL | Status: AC
  Administered 2019-12-03: 20 mg via ORAL
  Filled 2019-12-03: qty 1

## 2019-12-03 MED ORDER — MIDAZOLAM HCL 2 MG/2ML IJ SOLN
INTRAMUSCULAR | Status: AC
  Filled 2019-12-03: qty 2

## 2019-12-03 MED ORDER — FENTANYL CITRATE (PF) 100 MCG/2ML IJ SOLN
INTRAMUSCULAR | Status: AC
  Filled 2019-12-03: qty 2

## 2019-12-03 MED ORDER — OXYCODONE HCL 5 MG/5ML PO SOLN: 5.0000 mg | Freq: Once | ORAL | Status: AC | PRN

## 2019-12-03 MED ORDER — CEFAZOLIN SODIUM-DEXTROSE 2-4 GM/100ML-% IV SOLN
2.0000 g | INTRAVENOUS | Status: AC
  Administered 2019-12-03: 2 g via INTRAVENOUS

## 2019-12-03 MED ORDER — MIDAZOLAM HCL 2 MG/2ML IJ SOLN
INTRAMUSCULAR | Status: DC | PRN
  Administered 2019-12-03: 2 mg via INTRAVENOUS

## 2019-12-03 MED ORDER — CEFAZOLIN SODIUM-DEXTROSE 2-4 GM/100ML-% IV SOLN
INTRAVENOUS | Status: AC
  Filled 2019-12-03: qty 100

## 2019-12-03 MED ORDER — ONDANSETRON HCL 4 MG/2ML IJ SOLN
INTRAMUSCULAR | Status: AC
  Filled 2019-12-03: qty 2

## 2019-12-03 MED ORDER — LIDOCAINE HCL (PF) 2 % IJ SOLN
INTRAMUSCULAR | Status: AC
  Filled 2019-12-03: qty 5

## 2019-12-03 MED ORDER — PROPOFOL 10 MG/ML IV BOLUS
INTRAVENOUS | Status: AC
  Filled 2019-12-03: qty 20

## 2019-12-03 MED ORDER — FENTANYL CITRATE (PF) 100 MCG/2ML IJ SOLN
25.0000 ug | INTRAMUSCULAR | Status: DC | PRN
  Administered 2019-12-03: 25 ug via INTRAVENOUS

## 2019-12-03 MED ORDER — OXYCODONE HCL 5 MG PO TABS
ORAL_TABLET | ORAL | Status: AC
  Administered 2019-12-03: 5 mg via ORAL
  Filled 2019-12-03: qty 1

## 2019-12-03 MED ORDER — FAMOTIDINE 20 MG PO TABS: 20.0000 mg | ORAL_TABLET | Freq: Once | ORAL | Status: AC

## 2019-12-03 MED ORDER — LIDOCAINE HCL (CARDIAC) PF 100 MG/5ML IV SOSY
PREFILLED_SYRINGE | INTRAVENOUS | Status: DC | PRN
  Administered 2019-12-03: 100 mg via INTRAVENOUS

## 2019-12-03 MED ORDER — DEXAMETHASONE SODIUM PHOSPHATE 10 MG/ML IJ SOLN
INTRAMUSCULAR | Status: AC
  Filled 2019-12-03: qty 1

## 2019-12-03 MED ORDER — ONDANSETRON HCL 4 MG/2ML IJ SOLN
INTRAMUSCULAR | Status: DC | PRN
  Administered 2019-12-03: 4 mg via INTRAVENOUS

## 2019-12-03 SURGICAL SUPPLY — 18 items
BAG DRAIN CYSTO-URO LG1000N (MISCELLANEOUS) ×2 IMPLANT
BRUSH SCRUB EZ  4% CHG (MISCELLANEOUS) ×1
BRUSH SCRUB EZ 4% CHG (MISCELLANEOUS) ×1 IMPLANT
DRSG TELFA 4X3 1S NADH ST (GAUZE/BANDAGES/DRESSINGS) ×2 IMPLANT
ELECT REM PT RETURN 9FT ADLT (ELECTROSURGICAL) ×2
ELECTRODE REM PT RTRN 9FT ADLT (ELECTROSURGICAL) ×1 IMPLANT
GLOVE BIOGEL PI IND STRL 7.5 (GLOVE) ×1 IMPLANT
GLOVE BIOGEL PI INDICATOR 7.5 (GLOVE) ×1
GOWN STRL REUS W/ TWL LRG LVL3 (GOWN DISPOSABLE) ×1 IMPLANT
GOWN STRL REUS W/ TWL XL LVL3 (GOWN DISPOSABLE) ×1 IMPLANT
GOWN STRL REUS W/TWL LRG LVL3 (GOWN DISPOSABLE) ×1
GOWN STRL REUS W/TWL XL LVL3 (GOWN DISPOSABLE) ×1
KIT TURNOVER CYSTO (KITS) ×2 IMPLANT
PACK CYSTO AR (MISCELLANEOUS) ×2 IMPLANT
SET CYSTO W/LG BORE CLAMP LF (SET/KITS/TRAYS/PACK) ×2 IMPLANT
SURGILUBE 2OZ TUBE FLIPTOP (MISCELLANEOUS) ×2 IMPLANT
WATER STERILE IRR 1000ML POUR (IV SOLUTION) ×2 IMPLANT
WATER STERILE IRR 3000ML UROMA (IV SOLUTION) ×2 IMPLANT

## 2019-12-03 NOTE — Discharge Instructions (Signed)
AMBULATORY SURGERY  °DISCHARGE INSTRUCTIONS ° ° °1) The drugs that you were given will stay in your system until tomorrow so for the next 24 hours you should not: ° °A) Drive an automobile °B) Make any legal decisions °C) Drink any alcoholic beverage ° ° °2) You may resume regular meals tomorrow.  Today it is better to start with liquids and gradually work up to solid foods. ° °You may eat anything you prefer, but it is better to start with liquids, then soup and crackers, and gradually work up to solid foods. ° ° °3) Please notify your doctor immediately if you have any unusual bleeding, trouble breathing, redness and pain at the surgery site, drainage, fever, or pain not relieved by medication. ° ° ° °4) Additional Instructions: ° ° ° ° ° ° ° °Please contact your physician with any problems or Same Day Surgery at [phone], Monday through Friday 6 am to 4 pm, or [place] [facility] Main number at [phone].AMBULATORY SURGERY  °DISCHARGE INSTRUCTIONS ° ° °5) The drugs that you were given will stay in your system until tomorrow so for the next 24 hours you should not: ° °D) Drive an automobile °E) Make any legal decisions °F) Drink any alcoholic beverage ° ° °6) You may resume regular meals tomorrow.  Today it is better to start with liquids and gradually work up to solid foods. ° °You may eat anything you prefer, but it is better to start with liquids, then soup and crackers, and gradually work up to solid foods. ° ° °7) Please notify your doctor immediately if you have any unusual bleeding, trouble breathing, redness and pain at the surgery site, drainage, fever, or pain not relieved by medication. ° ° ° °8) Additional Instructions: ° ° ° ° ° ° ° °Please contact your physician with any problems or Same Day Surgery at [phone], Monday through Friday 6 am to 4 pm, or  at [facility] Main number at [phone]. °

## 2019-12-03 NOTE — Anesthesia Postprocedure Evaluation (Signed)
Anesthesia Post Note  Patient: Kendra Rhodes  Procedure(s) Performed: CYSTOSCOPY WITH bladder BIOPSY (N/A Bladder) CYSTOSCOPY WITH FULGERATION (N/A Bladder)  Patient location during evaluation: PACU Anesthesia Type: General Level of consciousness: awake and alert Pain management: pain level controlled Vital Signs Assessment: post-procedure vital signs reviewed and stable Respiratory status: spontaneous breathing, nonlabored ventilation and respiratory function stable Cardiovascular status: blood pressure returned to baseline and stable Postop Assessment: no apparent nausea or vomiting Anesthetic complications: no     Last Vitals:  Vitals:   12/03/19 1352 12/03/19 1407  BP: (!) 149/90 (!) 148/90  Pulse: (!) 54   Resp:    Temp:    SpO2: 100%     Last Pain:  Vitals:   12/03/19 1407  TempSrc:   PainSc: [place]

## 2019-12-03 NOTE — H&P (Signed)
12/03/19 12:34 PM   Kendra Rhodes [birth year] [id number]   HPI: Ms. Kendra Rhodes presents today for cystoscopy and bladder biopsy.  She had a possible bladder mass seen on ultrasound, and there is some erythema in the bladder on clinic cystoscopy.  CT urogram was negative for any evidence of metastatic disease or significant bladder tumor on delayed phase.  She denies any chest pain, shortness of breath, fevers or chills.   PMH: Past Medical History:  Diagnosis Date  . Anemia    iron deficiency and vitamin d deficiency  . Anxiety   . Bladder mass 11/2019  . Breast mass 10/2019   bilateral, ? cysts  . CKD (chronic kidney disease), stage III   . Depression   . Hypertension   . Insomnia   . Pre-diabetes   . Sickle cell trait ([place])   . Thyroid goiter   . Tubular adenoma of colon   . Wears contact lenses   . Wears dentures    upper and lower partials, also permanent retainer    Surgical History: Past Surgical History:  Procedure Laterality Date  . ABDOMINAL HYSTERECTOMY     for prolapse per pt with mesh ovaries still intact no h/o abnormal pap  . COLONOSCOPY WITH PROPOFOL N/A 06/10/2019   Procedure: COLONOSCOPY WITH PROPOFOL;  Surgeon: Lucilla Lame, MD;  Location: [facility];  Service: Endoscopy;  Laterality: N/A;  . ESOPHAGOGASTRODUODENOSCOPY (EGD) WITH PROPOFOL N/A 06/10/2019   Procedure: ESOPHAGOGASTRODUODENOSCOPY (EGD) WITH PROPOFOL;  Surgeon: Lucilla Lame, MD;  Location: [facility];  Service: Endoscopy;  Laterality: N/A;  . EYE SURGERY Bilateral    pterygium, both sides repaired  . GIVENS CAPSULE STUDY N/A 08/24/2019   Procedure: GIVENS CAPSULE STUDY;  Surgeon: Lucilla Lame, MD;  Location: [facility] ENDOSCOPY;  Service: Endoscopy;  Laterality: N/A;  . POLYPECTOMY  06/10/2019   Procedure: POLYPECTOMY;  Surgeon: Lucilla Lame, MD;  Location: [facility];  Service: Endoscopy;;     Allergies:  Allergies  Allergen Reactions  . Sulfa Antibiotics Itching,  Swelling and Other (See Comments)  . Wheat Bran Other (See Comments)    bloating  . Ambien [Zolpidem Tartrate] Other (See Comments)    Nightmares   . Aspirin Other (See Comments)    Gastric irritation  . Prednisone Palpitations  . Shellfish Allergy Itching  . Strawberry Extract Itching    Family History: Family History  Problem Relation Age of Onset  . Breast cancer Mother 5  . Diabetes Mother   . Kidney failure Father   . Alcohol abuse Father   . Heart disease Father        chf  . Hyperlipidemia Father   . Other Other        fatty liver cousin  . Sickle cell anemia Other        nephew  . Cancer Other        aunt with ovarian cancer  . Lung cancer Maternal Aunt     Social History:  reports that she has never smoked. She has never used smokeless tobacco. She reports that she does not drink alcohol or use drugs.  ROS: Please see flowsheet from today's date for complete review of systems.  Physical Exam: BP 133/90   Pulse 77   Temp 97.7 F (36.5 C) (Tympanic)   Resp 20   Ht 5\' 6"  (1.676 m)   Wt 85.7 kg   SpO2 100%   BMI 30.49 kg/m    Constitutional:  Alert and oriented, No  acute distress. Cardiovascular: Regular rate and rhythm Respiratory: Clear to auscultation bilaterally GI: Abdomen is soft, nontender, nondistended, no abdominal masses Lymph: No cervical or inguinal lymphadenopathy. Skin: No rashes, bruises or suspicious lesions. Neurologic: Grossly intact, no focal deficits, moving all 4 extremities. Psychiatric: Normal mood and affect.  Laboratory Data: Urine culture 11/26/2019 with asymptomatic E. coli, has been on culture appropriate nitrofurantoin for 4 days   Assessment & Plan:   In summary, she is a 63 year old female with a possible bladder tumor seen on bladder ultrasound, and some erythema in the bladder on clinic cystoscopy.  We discussed transurethral resection of bladder tumor (TURBT) and risks and benefits at length. This is typically a  1 to 2-hour procedure done under general anesthesia in the operating room.  A scope is inserted through the urethra and used to resect abnormal tissue within the bladder, which is then sent to the pathologist to determine grade and stage of the tumor.  Risks include bleeding, infection, need for temporary Foley placement, and bladder perforation.  Treatment strategies are based on the type of tumor and depth of invasion.  We briefly reviewed the different treatment pathways for non-muscle invasive and muscle invasive bladder cancer.  Cystoscopy and bladder biopsy today  Billey Co, MD  [facility] [address] [place], [place] [address] [phone]

## 2019-12-03 NOTE — OR Nursing (Signed)
MD left a small 4x4 tucked in peri area due to small amount of urethral bleeding. Reported this to PACU nurse and notified to remove this before patient goes home.

## 2019-12-03 NOTE — Anesthesia Post-op Follow-up Note (Signed)
Anesthesia QCDR form completed.        

## 2019-12-03 NOTE — Anesthesia Procedure Notes (Signed)
Procedure Name: LMA Insertion Date/Time: 12/03/2019 1:00 PM Performed by: Rona Ravens, CRNA Pre-anesthesia Checklist: Patient identified, Emergency Drugs available, Suction available, Patient being monitored and Timeout performed Patient Re-evaluated:Patient Re-evaluated prior to induction Oxygen Delivery Method: Circle system utilized Preoxygenation: Pre-oxygenation with 100% oxygen Induction Type: IV induction Ventilation: Mask ventilation without difficulty LMA: LMA inserted LMA Size: 4.0 Number of attempts: 2 Placement Confirmation: positive ETCO2 Tube secured with: Tape Dental Injury: Teeth and Oropharynx as per pre-operative assessment

## 2019-12-03 NOTE — Anesthesia Preprocedure Evaluation (Addendum)
Anesthesia Evaluation  Patient identified by MRN, date of birth, ID band Patient awake    Reviewed: Allergy & Precautions, H&P , NPO status , Patient's Chart, lab work & pertinent test results  Airway Mallampati: II  TM Distance: >3 FB Neck ROM: full    Dental  (+) Partial Upper   Pulmonary neg pulmonary ROS,           Cardiovascular hypertension,      Neuro/Psych PSYCHIATRIC DISORDERS Anxiety Depression negative neurological ROS     GI/Hepatic negative GI ROS, Neg liver ROS,   Endo/Other  diabetes  Renal/GU CRFRenal disease     Musculoskeletal   Abdominal   Peds  Hematology  (+) Blood dyscrasia, anemia ,   Anesthesia Other Findings Past Medical History: No date: Anemia     Comment:  iron deficiency and vitamin d deficiency No date: Anxiety 11/2019: Bladder mass 10/2019: Breast mass     Comment:  bilateral, ? cysts No date: CKD (chronic kidney disease), stage III No date: Depression No date: Hypertension No date: Insomnia No date: Pre-diabetes No date: Sickle cell trait (HCC) No date: Thyroid goiter No date: Tubular adenoma of colon No date: Wears contact lenses No date: Wears dentures     Comment:  upper and lower partials, also permanent retainer  Past Surgical History: No date: ABDOMINAL HYSTERECTOMY     Comment:  for prolapse per pt with mesh ovaries still intact no               h/o abnormal pap 06/10/2019: COLONOSCOPY WITH PROPOFOL; N/A     Comment:  Procedure: COLONOSCOPY WITH PROPOFOL;  Surgeon: Lucilla Lame, MD;  Location: [place];  Service:               Endoscopy;  Laterality: N/A; 06/10/2019: ESOPHAGOGASTRODUODENOSCOPY (EGD) WITH PROPOFOL; N/A     Comment:  Procedure: ESOPHAGOGASTRODUODENOSCOPY (EGD) WITH               PROPOFOL;  Surgeon: Lucilla Lame, MD;  Location: [place];  Service: Endoscopy;  Laterality: N/A; No date: EYE SURGERY;  Bilateral     Comment:  pterygium, both sides repaired 08/24/2019: GIVENS CAPSULE STUDY; N/A     Comment:  Procedure: GIVENS CAPSULE STUDY;  Surgeon: Lucilla Lame,              MD;  Location: [facility];  Service: Endoscopy;                Laterality: N/A; 06/10/2019: POLYPECTOMY     Comment:  Procedure: POLYPECTOMY;  Surgeon: Lucilla Lame, MD;                Location: [facility];  Service: Endoscopy;;  BMI    Body Mass Index: 30.49 kg/m      Reproductive/Obstetrics negative OB ROS                           Anesthesia Physical Anesthesia Plan  ASA: II  Anesthesia Plan: General LMA   Post-op Pain Management:    Induction:   PONV Risk Score and Plan: Dexamethasone, Ondansetron, Midazolam and Treatment may vary due to age or medical condition  Airway Management Planned:   Additional Equipment:   Intra-op Plan:   Post-operative Plan:   Informed Consent:  I have reviewed the patients History and Physical, chart, labs and discussed the procedure including the risks, benefits and alternatives for the proposed anesthesia with the patient or authorized representative who has indicated his/her understanding and acceptance.     Dental Advisory Given  Plan Discussed with: Anesthesiologist  Anesthesia Plan Comments:        Anesthesia Quick Evaluation

## 2019-12-03 NOTE — Op Note (Signed)
Date of procedure: 12/03/19  Preoperative diagnosis:  1. Bladder lesion, 1 cm  Postoperative diagnosis:  1. Same  Procedure: 1. Cystoscopy, bladder biopsy and fulguration  Surgeon: Nickolas Madrid, MD  Anesthesia: General  Complications: None  Intraoperative findings:  1.  Moderate size cystocele, ureteral orifices orthotopic bilaterally 2.  Small 1 cm patch of erythema at the posterior bladder wall/dome, biopsied and fulgurated 3.  Excellent hemostasis  EBL: Minimal  Specimens: Bladder biopsy  Drains: None  Indication: Kendra Rhodes is a 63 y.o. patient with possible bladder mass seen on bladder ultrasound and microscopic hematuria.  On clinic cystoscopy there is some subtle erythema at the posterior bladder wall.  After reviewing the management options for treatment, they elected to proceed with the above surgical procedure(s). We have discussed the potential benefits and risks of the procedure, side effects of the proposed treatment, the likelihood of the patient achieving the goals of the procedure, and any potential problems that might occur during the procedure or recuperation. Informed consent has been obtained.  Description of procedure:  The patient was taken to the operating room and general anesthesia was induced. SCDs were placed for DVT prophylaxis. The patient was placed in the dorsal lithotomy position, prepped and draped in the usual sterile fashion, and preoperative antibiotics(Ancef) were administered. A preoperative time-out was performed.   A 21 French rigid cystoscope was used to intubate the urethra and thorough cystoscopy was performed.  There is a moderate size cystocele, and the ureteral orifices were orthotopic bilaterally.  Thorough cystoscopy was performed and there was some cystitis cystica throughout, and a small 1 cm patch of erythema at the posterior bladder wall/dome.  This was biopsied and fulgurated.  There was excellent hemostasis with the bladder  decompressed.  Disposition: Stable to PACU  Plan: Follow-up pathology  Nickolas Madrid, MD

## 2019-12-03 NOTE — Transfer of Care (Signed)
Immediate Anesthesia Transfer of Care Note  Patient: Kendra Rhodes  Procedure(s) Performed: CYSTOSCOPY WITH bladder BIOPSY (N/A Bladder) CYSTOSCOPY WITH FULGERATION (N/A Bladder)  Patient Location: PACU  Anesthesia Type:General  Level of Consciousness: awake, alert  and oriented  Airway & Oxygen Therapy: Patient Spontanous Breathing and Patient connected to face mask oxygen  Post-op Assessment: Report given to RN and Post -op Vital signs reviewed and stable  Post vital signs: Reviewed and stable  Last Vitals:  Vitals Value Taken Time  BP 131/85 12/03/19 1322  Temp 36.1 C 12/03/19 1322  Pulse 57 12/03/19 1324  Resp 14 12/03/19 1324  SpO2 100 % 12/03/19 1324  Vitals shown include unvalidated device data.  Last Pain:  Vitals:   12/03/19 1139  TempSrc: Tympanic  PainSc: 0-No pain         Complications: No apparent anesthesia complications

## 2019-12-03 NOTE — Progress Notes (Signed)
Reported to Beverly Gust, RN in post op of 4x4 gauze tucked in peri area to be removed prior to discharge. Manuela Schwartz and patient voiced understanding.

## 2019-12-03 NOTE — Progress Notes (Signed)
Reported to [facility] K, relief RN, of the 4x4 tucked in peri area and should be removed prior to discharge. Kaitlyn verbalized understanding.

## 2019-12-06 LAB — SURGICAL PATHOLOGY

## 2019-12-13 ENCOUNTER — Telehealth: Payer: Self-pay | Admitting: *Deleted

## 2019-12-13 NOTE — Telephone Encounter (Addendum)
Patient informed-voiced understanding.   ----- Message from Billey Co, MD sent at 12/12/2019  3:30 PM EST ----- Great news, no cancer seen in bladder biopsy. Keep follow up as scheduled to discuss in more detail  Nickolas Madrid, MD 12/12/2019

## 2019-12-16 DIAGNOSIS — Z20828 Contact with and (suspected) exposure to other viral communicable diseases: Secondary | ICD-10-CM | POA: Diagnosis not present

## 2019-12-20 ENCOUNTER — Telehealth: Payer: Self-pay | Admitting: Internal Medicine

## 2019-12-20 NOTE — Telephone Encounter (Signed)
Pt needs to find out about thyroid ultrasound. Please advise

## 2019-12-21 ENCOUNTER — Other Ambulatory Visit: Payer: Self-pay

## 2019-12-21 ENCOUNTER — Ambulatory Visit: Payer: Federal, State, Local not specified - PPO | Admitting: Urology

## 2019-12-21 ENCOUNTER — Encounter: Payer: Self-pay | Admitting: Urology

## 2019-12-21 VITALS — BP 133/85 | HR 91 | Ht 66.0 in | Wt 189.0 lb

## 2019-12-21 DIAGNOSIS — N3020 Other chronic cystitis without hematuria: Secondary | ICD-10-CM | POA: Diagnosis not present

## 2019-12-21 DIAGNOSIS — L821 Other seborrheic keratosis: Secondary | ICD-10-CM | POA: Diagnosis not present

## 2019-12-21 NOTE — Telephone Encounter (Signed)
Patient was informed of the number to her endocrinologist. She is rescheduling her appointment

## 2019-12-21 NOTE — Progress Notes (Addendum)
   12/21/2019 9:03 AM   Avie Arenas Linder February 03, 1956 [id number]  Reason for visit: Follow up bladder biopsy results  HPI: I saw Ms. Grandfield back in urology clinic to discuss her bladder biopsy results.  Briefly, she is a 64 year old female who was originally referred for a possible bladder mass seen on ultrasound as well as microscopic hematuria.  The ultrasound was originally performed for elevated creatinine.  Hematuria work-up was performed and CT urogram was benign, however there was a small patch of erythema at the posterior bladder wall on clinic cystoscopy.  She underwent cystoscopy and bladder biopsy in the OR on 12/03/2019, and this showed only chronic cystitis and was negative for any atypia or malignancy.  We discussed these results at length today.  She has done well after surgery and denies any dysuria, pain, or gross hematuria.  We discussed the need for adequate hydration and good blood pressure control regarding protecting renal function.  Most recent creatinine was improved at 1.18, eGFR 57.  Follow-up as needed  A total of 20 minutes were spent face-to-face with the patient, greater than 50% was spent in patient education, counseling, and coordination of care regarding bladder biopsy results and CT findings.  Billey Co, [facility] [address] [place], [place] [address] [phone]

## 2020-01-12 ENCOUNTER — Telehealth: Payer: Self-pay | Admitting: Internal Medicine

## 2020-01-12 NOTE — Telephone Encounter (Signed)
I called pt twice and was unable to leave vm due to vm full.

## 2020-01-14 ENCOUNTER — Ambulatory Visit (INDEPENDENT_AMBULATORY_CARE_PROVIDER_SITE_OTHER): Payer: Federal, State, Local not specified - PPO | Admitting: Internal Medicine

## 2020-01-14 ENCOUNTER — Other Ambulatory Visit: Payer: Self-pay

## 2020-01-14 VITALS — Ht 66.0 in | Wt 190.0 lb

## 2020-01-14 DIAGNOSIS — R319 Hematuria, unspecified: Secondary | ICD-10-CM | POA: Diagnosis not present

## 2020-01-14 DIAGNOSIS — I10 Essential (primary) hypertension: Secondary | ICD-10-CM

## 2020-01-14 DIAGNOSIS — N3000 Acute cystitis without hematuria: Secondary | ICD-10-CM

## 2020-01-14 DIAGNOSIS — Z113 Encounter for screening for infections with a predominantly sexual mode of transmission: Secondary | ICD-10-CM

## 2020-01-14 DIAGNOSIS — K862 Cyst of pancreas: Secondary | ICD-10-CM

## 2020-01-14 DIAGNOSIS — K8020 Calculus of gallbladder without cholecystitis without obstruction: Secondary | ICD-10-CM

## 2020-01-14 DIAGNOSIS — N1831 Chronic kidney disease, stage 3a: Secondary | ICD-10-CM | POA: Diagnosis not present

## 2020-01-14 DIAGNOSIS — N281 Cyst of kidney, acquired: Secondary | ICD-10-CM

## 2020-01-14 DIAGNOSIS — R519 Headache, unspecified: Secondary | ICD-10-CM

## 2020-01-14 DIAGNOSIS — E119 Type 2 diabetes mellitus without complications: Secondary | ICD-10-CM

## 2020-01-14 DIAGNOSIS — E559 Vitamin D deficiency, unspecified: Secondary | ICD-10-CM

## 2020-01-17 ENCOUNTER — Ambulatory Visit: Payer: Federal, State, Local not specified - PPO | Attending: Internal Medicine

## 2020-01-17 DIAGNOSIS — Z20822 Contact with and (suspected) exposure to covid-19: Secondary | ICD-10-CM | POA: Diagnosis not present

## 2020-01-17 DIAGNOSIS — K8020 Calculus of gallbladder without cholecystitis without obstruction: Secondary | ICD-10-CM | POA: Insufficient documentation

## 2020-01-17 NOTE — Progress Notes (Signed)
Virtual Visit via Video Note  I connected with Kendra Rhodes  on 01/14/20 at  2:45 PM EST by a video enabled telemedicine application and verified that I am speaking with the correct person using two identifiers.  Location patient: home Location provider:work or home office Persons participating in the virtual visit: patient, provider  I discussed the limitations of evaluation and management by telemedicine and the availability of in person appointments. The patient expressed understanding and agreed to proceed.   HPI: Follow up  1. HTN should be on norvasc 5 mg qd, hctz 12.5 mg qd not checked BP due to needs batteries for her machine 2. CKD 3 last GFR 57 and Cr 1.18  3. Insomnia she reports restoril 30 mg is no longer working and sleeping from 9-10 pm to 4 am . She reports she is still taking celexa 10 mg for anxiety/depression qd but stressful working at the [employer]. She previously could not tolerate belsombra/ambien and does not want to change meds for now  4. C/o sinus h/a which typically happens during change in weather she wants to hold on covid 19 testing and Abx for now but will call back if gets this scheduled and may get tested next week   ROS: See pertinent positives and negatives per HPI.  Past Medical History:  Diagnosis Date  . Anemia    iron deficiency and vitamin d deficiency  . Anxiety   . Bladder mass 11/2019  . Breast mass 10/2019   bilateral, ? cysts  . CKD (chronic kidney disease), stage III   . Depression   . Hypertension   . Insomnia   . Pre-diabetes   . Sickle cell trait ([place])   . Thyroid goiter   . Tubular adenoma of colon   . Wears contact lenses   . Wears dentures    upper and lower partials, also permanent retainer    Past Surgical History:  Procedure Laterality Date  . ABDOMINAL HYSTERECTOMY     for prolapse per pt with mesh ovaries still intact no h/o abnormal pap  . COLONOSCOPY WITH PROPOFOL N/A 06/10/2019   Procedure: COLONOSCOPY WITH  PROPOFOL;  Surgeon: Lucilla Lame, MD;  Location: [place];  Service: Endoscopy;  Laterality: N/A;  . CYSTOSCOPY WITH BIOPSY N/A 12/03/2019   Procedure: CYSTOSCOPY WITH bladder BIOPSY;  Surgeon: Billey Co, MD;  Location: [facility];  Service: Urology;  Laterality: N/A;  . CYSTOSCOPY WITH FULGERATION N/A 12/03/2019   Procedure: CYSTOSCOPY WITH FULGERATION;  Surgeon: Billey Co, MD;  Location: [facility];  Service: Urology;  Laterality: N/A;  . ESOPHAGOGASTRODUODENOSCOPY (EGD) WITH PROPOFOL N/A 06/10/2019   Procedure: ESOPHAGOGASTRODUODENOSCOPY (EGD) WITH PROPOFOL;  Surgeon: Lucilla Lame, MD;  Location: [place];  Service: Endoscopy;  Laterality: N/A;  . EYE SURGERY Bilateral    pterygium, both sides repaired  . GIVENS CAPSULE STUDY N/A 08/24/2019   Procedure: GIVENS CAPSULE STUDY;  Surgeon: Lucilla Lame, MD;  Location: [facility];  Service: Endoscopy;  Laterality: N/A;  . POLYPECTOMY  06/10/2019   Procedure: POLYPECTOMY;  Surgeon: Lucilla Lame, MD;  Location: [place];  Service: Endoscopy;;    Family History  Problem Relation Age of Onset  . Breast cancer Mother 50  . Diabetes Mother   . Kidney failure Father   . Alcohol abuse Father   . Heart disease Father        chf  . Hyperlipidemia Father   . Other Other  fatty liver cousin  . Sickle cell anemia Other        nephew  . Cancer Other        aunt with ovarian cancer  . Lung cancer Maternal Aunt     SOCIAL HX:  [employer]  2 kids daughter Corky Sing and son Llesuer Marcello [employer] weekends    Current Outpatient Medications:  .  amLODipine (NORVASC) 2.5 MG tablet, Take 2.5 mg by mouth daily., Disp: , Rfl:  .  amLODipine (NORVASC) 5 MG tablet, Take 1 tablet (5 mg total) by mouth daily., Disp: 90 tablet, Rfl: 3 .  atenolol (TENORMIN) 25 MG tablet, Take 25 mg by mouth daily. , Disp: , Rfl:  .  Cholecalciferol (VITAMIN D) 50 MCG (2000 UT) CAPS, Take  2,000 Units by mouth every 7 (seven) days., Disp: , Rfl:  .  citalopram (CELEXA) 10 MG tablet, Take 10 mg by mouth daily., Disp: , Rfl:  .  ezetimibe (ZETIA) 10 MG tablet, Take 10 mg by mouth daily. , Disp: , Rfl:  .  hydrochlorothiazide (HYDRODIURIL) 25 MG tablet, Take 0.5 tablets (12.5 mg total) by mouth daily. In am (Patient taking differently: Take 25 mg by mouth daily. ), Disp: 45 tablet, Rfl: 3 .  levocetirizine (XYZAL) 5 MG tablet, Take 5 mg by mouth every evening., Disp: , Rfl:  .  temazepam (RESTORIL) 30 MG capsule, Take 1 capsule (30 mg total) by mouth at bedtime as needed for sleep. (Patient taking differently: Take 30 mg by mouth at bedtime as needed for sleep. ), Disp: 30 capsule, Rfl: 5 .  TRULANCE 3 MG TABS, Take 3 mg by mouth daily as needed (constipation). , Disp: , Rfl:   EXAM:  VITALS per patient if applicable:  GENERAL: alert, oriented, appears well and in no acute distress  HEENT: atraumatic, conjunttiva clear, no obvious abnormalities on inspection of external nose and ears  NECK: normal movements of the head and neck  LUNGS: on inspection no signs of respiratory distress, breathing rate appears normal, no obvious gross SOB, gasping or wheezing  CV: no obvious cyanosis  MS: moves all visible extremities without noticeable abnormality  PSYCH/NEURO: pleasant and cooperative, no obvious depression or anxiety, speech and thought processing grossly intact  ASSESSMENT AND PLAN:  Discussed the following assessment and plan:  Essential hypertension Cont norvasc 5 mg qd, hctz 12.5 mg qd  Monitor BP   Stage 3a chronic kidney with kidney cysts GFR improved 11/22/19 57 as well as Cr 1.18 monitor  Check again 03/2020  Hematuria 11/2019 bx Dr. Diamantina Providence benign  Also had UTI will repeat urine   Pancreas cyst in uncinate process  Consider repeat imaging Ct/MRI 11/21/20 for stability   Gallstones w/o sxs per [place] 07/2019   Sinus h/a  -pt will consider covid 19 testing  next week  She wants to hold on Abx for now   HM Fasting labs 03/2020  Flu utd 2020  Requested records of vaccines from prior PCP Consider Tdap and shingrix Hep C negative   mamo sch 9/17/20[place] 09/02/19 right breast cyst benign and cluster of cysts left breast  -left breast cysts f/u US and dx mammogram due in 6 months orderedsch 03/02/20   S/p hysterectomy no h/o abnormal pap, ovaries intact consider pelvic in future   Dexa consider age 81   colonoscopy Dr. Allen Norris 06/10/19/egd chronic gastritis, tubular adenoma f/u in 5 years rec healthy diet and exercise   Consider CT/MRI abdomen pancreatic  cyst uncinate 1.9 from 1.4 cm likely benign to ensure stability 11/21/20   10/05/19 US thyroid  -->appt to f/u 02/03/20 Dr. Ladell Pier   IMPRESSION: Enlarging TI-RADS category 3 nodule occupying the left mid and inferior gland now measuring up to 5.9 cm compared to 5.1 cm previously. This lesion continues to meet criteria for consideration of fine-needle aspiration biopsy.   -we discussed possible serious and likely etiologies, options for evaluation and workup, limitations of telemedicine visit vs in person visit, treatment, treatment risks and precautions. Pt prefers to treat via telemedicine empirically rather then risking or undertaking an in person visit at this moment. Patient agrees to seek prompt in person care if worsening, new symptoms arise, or if is not improving with treatment.   I discussed the assessment and treatment plan with the patient. The patient was provided an opportunity to ask questions and all were answered. The patient agreed with the plan and demonstrated an understanding of the instructions.   The patient was advised to call back or seek an in-person evaluation if the symptoms worsen or if the condition fails to improve as anticipated.  Time spent 20-29 minutes  Delorise Jackson, MD

## 2020-01-18 LAB — NOVEL CORONAVIRUS, NAA: SARS-CoV-2, NAA: NOT DETECTED

## 2020-03-02 ENCOUNTER — Ambulatory Visit
Admission: RE | Admit: 2020-03-02 | Discharge: 2020-03-02 | Disposition: A | Discharge status: Home / Self Care | Payer: Federal, State, Local not specified - PPO | Source: Ambulatory Visit | Attending: Internal Medicine | Admitting: Internal Medicine

## 2020-03-02 DIAGNOSIS — N6320 Unspecified lump in the left breast, unspecified quadrant: Secondary | ICD-10-CM | POA: Diagnosis not present

## 2020-03-02 DIAGNOSIS — N6002 Solitary cyst of left breast: Secondary | ICD-10-CM

## 2020-03-02 DIAGNOSIS — R928 Other abnormal and inconclusive findings on diagnostic imaging of breast: Secondary | ICD-10-CM | POA: Diagnosis not present

## 2020-03-08 DIAGNOSIS — E042 Nontoxic multinodular goiter: Secondary | ICD-10-CM | POA: Diagnosis not present

## 2020-03-08 DIAGNOSIS — E0590 Thyrotoxicosis, unspecified without thyrotoxic crisis or storm: Secondary | ICD-10-CM | POA: Diagnosis not present

## 2020-03-15 ENCOUNTER — Other Ambulatory Visit: Payer: Self-pay | Admitting: Internal Medicine

## 2020-03-15 DIAGNOSIS — E785 Hyperlipidemia, unspecified: Secondary | ICD-10-CM

## 2020-03-15 MED ORDER — EZETIMIBE 10 MG PO TABS: 10.0000 mg | ORAL_TABLET | Freq: Every day | ORAL | 3 refills | Status: DC

## 2020-03-20 ENCOUNTER — Telehealth: Payer: Self-pay | Admitting: Internal Medicine

## 2020-03-20 DIAGNOSIS — N3000 Acute cystitis without hematuria: Secondary | ICD-10-CM

## 2020-03-20 DIAGNOSIS — E559 Vitamin D deficiency, unspecified: Secondary | ICD-10-CM

## 2020-03-20 DIAGNOSIS — N1831 Chronic kidney disease, stage 3a: Secondary | ICD-10-CM

## 2020-03-20 DIAGNOSIS — E119 Type 2 diabetes mellitus without complications: Secondary | ICD-10-CM

## 2020-03-20 DIAGNOSIS — I10 Essential (primary) hypertension: Secondary | ICD-10-CM

## 2020-03-20 DIAGNOSIS — Z113 Encounter for screening for infections with a predominantly sexual mode of transmission: Secondary | ICD-10-CM

## 2020-03-20 NOTE — Telephone Encounter (Signed)
Pt called wanting the labs changed to [place] and she will be going to the one in [place]  Pt would like a call back when these have been changed

## 2020-03-21 NOTE — Addendum Note (Signed)
Addended by: Thressa Sheller on: 03/21/2020 03:36 PM   Modules accepted: Orders

## 2020-03-21 NOTE — Telephone Encounter (Signed)
Pended as future, lab collect, resulting [facility]

## 2020-03-22 ENCOUNTER — Other Ambulatory Visit: Payer: Federal, State, Local not specified - PPO

## 2020-03-22 DIAGNOSIS — N3000 Acute cystitis without hematuria: Secondary | ICD-10-CM | POA: Diagnosis not present

## 2020-03-22 DIAGNOSIS — E119 Type 2 diabetes mellitus without complications: Secondary | ICD-10-CM | POA: Diagnosis not present

## 2020-03-22 DIAGNOSIS — I10 Essential (primary) hypertension: Secondary | ICD-10-CM | POA: Diagnosis not present

## 2020-03-22 DIAGNOSIS — E559 Vitamin D deficiency, unspecified: Secondary | ICD-10-CM | POA: Diagnosis not present

## 2020-03-22 NOTE — Addendum Note (Signed)
Addended by: Orland Mustard on: 03/22/2020 08:39 AM   Modules accepted: Orders

## 2020-03-24 LAB — URINALYSIS, ROUTINE W REFLEX MICROSCOPIC
Bilirubin, UA: NEGATIVE
Glucose, UA: NEGATIVE
Ketones, UA: NEGATIVE
Leukocytes,UA: NEGATIVE
Nitrite, UA: NEGATIVE
RBC, UA: NEGATIVE
Specific Gravity, UA: 1.015 (ref 1.005–1.030)
Urobilinogen, Ur: 0.2 mg/dL (ref 0.2–1.0)
pH, UA: 6 (ref 5.0–7.5)

## 2020-03-24 LAB — CBC WITH DIFFERENTIAL/PLATELET
Basophils Absolute: 0.1 10*3/uL (ref 0.0–0.2)
Basos: 1 %
EOS (ABSOLUTE): 0.5 10*3/uL — ABNORMAL HIGH (ref 0.0–0.4)
Eos: 6 %
Hematocrit: 33.8 % — ABNORMAL LOW (ref 34.0–46.6)
Hemoglobin: 11.5 g/dL (ref 11.1–15.9)
Immature Grans (Abs): 0 10*3/uL (ref 0.0–0.1)
Immature Granulocytes: 0 %
Lymphocytes Absolute: 2.7 10*3/uL (ref 0.7–3.1)
Lymphs: 33 %
MCH: 28.8 pg (ref 26.6–33.0)
MCHC: 34 g/dL (ref 31.5–35.7)
MCV: 85 fL (ref 79–97)
Monocytes Absolute: 0.7 10*3/uL (ref 0.1–0.9)
Monocytes: 9 %
Neutrophils Absolute: 4.2 10*3/uL (ref 1.4–7.0)
Neutrophils: 51 %
Platelets: 287 10*3/uL (ref 150–450)
RBC: 3.99 x10E6/uL (ref 3.77–5.28)
RDW: 14.9 % (ref 11.7–15.4)
WBC: 8.2 10*3/uL (ref 3.4–10.8)

## 2020-03-24 LAB — COMPREHENSIVE METABOLIC PANEL
ALT: 16 IU/L (ref 0–32)
AST: 19 IU/L (ref 0–40)
Albumin/Globulin Ratio: 1.3 (ref 1.2–2.2)
Albumin: 4.4 g/dL (ref 3.8–4.8)
Alkaline Phosphatase: 145 IU/L — ABNORMAL HIGH (ref 39–117)
BUN/Creatinine Ratio: 19 (ref 12–28)
BUN: 25 mg/dL (ref 8–27)
Bilirubin Total: 0.3 mg/dL (ref 0.0–1.2)
CO2: 25 mmol/L (ref 20–29)
Calcium: 10.1 mg/dL (ref 8.7–10.3)
Chloride: 104 mmol/L (ref 96–106)
Creatinine, Ser: 1.32 mg/dL — ABNORMAL HIGH (ref 0.57–1.00)
GFR calc Af Amer: 50 mL/min/{1.73_m2} — ABNORMAL LOW (ref >59)
GFR calc non Af Amer: 43 mL/min/{1.73_m2} — ABNORMAL LOW (ref >59)
Globulin, Total: 3.4 g/dL (ref 1.5–4.5)
Glucose: 101 mg/dL — ABNORMAL HIGH (ref 65–99)
Potassium: 3.7 mmol/L (ref 3.5–5.2)
Sodium: 145 mmol/L — ABNORMAL HIGH (ref 134–144)
Total Protein: 7.8 g/dL (ref 6.0–8.5)

## 2020-03-24 LAB — MICROALBUMIN / CREATININE URINE RATIO
Creatinine, Urine: 69.4 mg/dL
Microalb/Creat Ratio: 254 mg/g creat — ABNORMAL HIGH (ref 0–29)
Microalbumin, Urine: 176.6 ug/mL

## 2020-03-24 LAB — LIPID PANEL
Chol/HDL Ratio: 3.4 ratio (ref 0.0–4.4)
Cholesterol, Total: 202 mg/dL — ABNORMAL HIGH (ref 100–199)
HDL: 60 mg/dL (ref >39)
LDL Chol Calc (NIH): 121 mg/dL — ABNORMAL HIGH (ref 0–99)
Triglycerides: 116 mg/dL (ref 0–149)
VLDL Cholesterol Cal: 21 mg/dL (ref 5–40)

## 2020-03-24 LAB — VITAMIN D 25 HYDROXY (VIT D DEFICIENCY, FRACTURES): Vit D, 25-Hydroxy: 33.6 ng/mL (ref 30.0–100.0)

## 2020-03-24 LAB — HEMOGLOBIN A1C
Est. average glucose Bld gHb Est-mCnc: 131 mg/dL
Hgb A1c MFr Bld: 6.2 % — ABNORMAL HIGH (ref 4.8–5.6)

## 2020-03-24 LAB — MICROSCOPIC EXAMINATION
Bacteria, UA: NONE SEEN
Casts: NONE SEEN /lpf
WBC, UA: NONE SEEN /hpf (ref 0–5)

## 2020-03-24 LAB — HIV ANTIBODY (ROUTINE TESTING W REFLEX): HIV Screen 4th Generation wRfx: NONREACTIVE

## 2020-03-24 LAB — URINE CULTURE: Organism ID, Bacteria: NO GROWTH

## 2020-03-31 ENCOUNTER — Other Ambulatory Visit: Payer: Self-pay | Admitting: Internal Medicine

## 2020-03-31 DIAGNOSIS — E785 Hyperlipidemia, unspecified: Secondary | ICD-10-CM

## 2020-03-31 MED ORDER — LOVASTATIN 20 MG PO TABS: 20.0000 mg | ORAL_TABLET | Freq: Every day | ORAL | 3 refills | Status: DC

## 2020-03-31 NOTE — Addendum Note (Signed)
Addended by: Orland Mustard on: 03/31/2020 05:14 PM   Modules accepted: Orders

## 2020-04-04 DIAGNOSIS — E042 Nontoxic multinodular goiter: Secondary | ICD-10-CM | POA: Diagnosis not present

## 2020-04-20 DIAGNOSIS — I10 Essential (primary) hypertension: Secondary | ICD-10-CM | POA: Insufficient documentation

## 2020-04-21 DIAGNOSIS — N1831 Chronic kidney disease, stage 3a: Secondary | ICD-10-CM | POA: Diagnosis not present

## 2020-04-28 ENCOUNTER — Other Ambulatory Visit: Payer: Self-pay

## 2020-05-02 ENCOUNTER — Encounter: Payer: Self-pay | Admitting: Internal Medicine

## 2020-05-02 ENCOUNTER — Other Ambulatory Visit: Payer: Self-pay

## 2020-05-02 ENCOUNTER — Ambulatory Visit: Payer: Federal, State, Local not specified - PPO | Admitting: Internal Medicine

## 2020-05-02 VITALS — BP 126/76 | HR 89 | Temp 97.4°F | Ht 66.0 in | Wt 188.0 lb

## 2020-05-02 DIAGNOSIS — N182 Chronic kidney disease, stage 2 (mild): Secondary | ICD-10-CM | POA: Insufficient documentation

## 2020-05-02 DIAGNOSIS — I10 Essential (primary) hypertension: Secondary | ICD-10-CM | POA: Diagnosis not present

## 2020-05-02 DIAGNOSIS — R1900 Intra-abdominal and pelvic swelling, mass and lump, unspecified site: Secondary | ICD-10-CM

## 2020-05-02 DIAGNOSIS — Z1231 Encounter for screening mammogram for malignant neoplasm of breast: Secondary | ICD-10-CM | POA: Diagnosis not present

## 2020-05-02 DIAGNOSIS — E669 Obesity, unspecified: Secondary | ICD-10-CM

## 2020-05-02 DIAGNOSIS — K862 Cyst of pancreas: Secondary | ICD-10-CM

## 2020-05-02 DIAGNOSIS — N281 Cyst of kidney, acquired: Secondary | ICD-10-CM

## 2020-05-02 DIAGNOSIS — L309 Dermatitis, unspecified: Secondary | ICD-10-CM

## 2020-05-02 DIAGNOSIS — N1831 Chronic kidney disease, stage 3a: Secondary | ICD-10-CM

## 2020-05-02 DIAGNOSIS — E042 Nontoxic multinodular goiter: Secondary | ICD-10-CM | POA: Diagnosis not present

## 2020-05-02 HISTORY — DX: Obesity, unspecified: E66.9

## 2020-05-02 HISTORY — DX: Chronic kidney disease, stage 2 (mild): N18.2

## 2020-05-02 MED ORDER — TRIAMCINOLONE ACETONIDE 0.1 % EX CREA: 1.0000 "application " | TOPICAL_CREAM | Freq: Two times a day (BID) | CUTANEOUS | 2 refills | Status: DC | PRN

## 2020-05-02 NOTE — Patient Instructions (Addendum)
HINT water Centrum/ nature made multivitamin with iron  Lab results 04/21/20   Munsoor Holley Raring, MD (Nephrology) [phone] (Work) [phone] (Fax) [phone] [place], Kendra Rhodes [address] Nephrology  Ask Dr. Holley Raring if you should be taking lisinopril 10 mg daily?     Back applicator  ?atenolol 25 mg and lisinopril 10 mg for blood pressure    take a pic of covid vaccine proof and all medication bottles please   GOITER  A goiter is an enlarged thyroid gland. The thyroid is located in the lower front of the neck. It makes hormones that affect many body parts and systems, including the system that affects how quickly the body burns fuel for energy (metabolism). Most goiters are painless and are not a cause for concern. Some goiters can affect the way your thyroid makes thyroid hormones. Goiters and conditions that cause goiters can be treated, if necessary. What are the causes? Common causes of this condition include:  Lack (deficiency) of a mineral called iodine. The thyroid gland uses iodine to make thyroid hormones.  Diseases that attack healthy cells in the body (autoimmune diseases) and affect thyroid function, such as Graves' disease or Hashimoto's disease. These diseases may cause the body to produce too much thyroid hormone (hyperthyroidism) or too little of the hormone (hypothyroidism).  Conditions that cause inflammation of the thyroid (thyroiditis).  One or more small growths on the thyroid (nodular goiter). Other causes include:  Medical problems caused by abnormal genes that are passed from parent to child (genetic defects).  Thyroid injury or infection.  Tumors that may or may not be cancerous.  Pregnancy.  Certain medicines.  Exposure to radiation. In some cases, the cause may not be known. What increases the risk? This condition is more likely to develop in:  People who do not get enough iodine in their diet.  People who have a family  history of goiter.  Women.  People who are older than age 53.  People who smoke tobacco.  People who have had exposure to radiation. What are the signs or symptoms? The main symptom of this condition is swelling in the lower, front part of the neck. This swelling can range from a very small bump to a large lump. Other symptoms may include:  A tight feeling in the throat.  A hoarse voice.  Coughing.  Wheezing.  Difficulty swallowing or breathing.  Bulging veins in the neck.  Dizziness. When a goiter is the result of an overactive thyroid (hyperthyroidism), symptoms may also include:  Nervousness or restlessness.  Inability to tolerate heat.  Unexplained weight loss.  Diarrhea.  Change in the texture of hair or skin.  Changes in heartbeat, such as skipped beats, extra beats, or a rapid heart rate.  Loss of menstruation.  Shaky hands.  Increased appetite.  Sleep problems. When a goiter is the result of an underactive thyroid (hypothyroidism), symptoms may also include:  Feeling like you have no energy (lethargy).  Inability to tolerate cold.  Weight gain that is not explained by a change in diet or exercise habits.  Dry skin.  Coarse hair.  Irregular menstrual periods.  Constipation.  Sadness or depression.  Fatigue. In some cases, there may not be any symptoms and the thyroid hormone levels may be normal. How is this diagnosed? This condition may be diagnosed based on your symptoms, your medical history, and a physical exam. You may have tests, such as:  Blood tests to check thyroid function.  Imaging tests,  such as: ? Ultrasound. ? CT scan. ? MRI. ? Thyroid scan.  Removal of a tissue sample (biopsy) of the goiter or any nodules. The sample will be tested to check for cancer. How is this treated? Treatment for this condition depends on the cause and your symptoms. Treatment may include:  Medicines to regulate thyroid hormone  levels.  Anti-inflammatory medicines or steroid medicines, if the goiter is caused by inflammation.  Iodine supplements or changes to your diet, if the goiter is caused by iodine deficiency.  Radioactive iodine treatment.  Surgery to remove your thyroid. In some cases, you may only need regular check-ups with your health care provider to monitor your condition, and you may not need treatment. Follow these instructions at home:  Follow instructions from your health care provider about any changes to your diet.  Take over-the-counter and prescription medicines only as told by your health care provider. These include supplements.  Do not use any products that contain nicotine or tobacco, such as cigarettes and e-cigarettes. If you need help quitting, ask your health care provider.  Keep all follow-up visits as told by your health care provider. This is important. Contact a health care provider if:  Your symptoms do not get better with treatment.  You have nausea, vomiting, or diarrhea. Get help right away if:  You have sudden, unexplained confusion or other mental changes.  You have a fever.  You have chest pain.  You have trouble breathing or swallowing.  You suddenly become very weak.  You experience extreme restlessness.  You feel your heart racing. Summary  A goiter is an enlarged thyroid gland.  The thyroid gland is located in the lower front of the neck. It makes hormones that affect many body parts and systems, including the system that affects how quickly the body burns fuel for energy (metabolism).  The main symptom of this condition is swelling in the lower, front part of the neck. This swelling can range from a very small bump to a large lump.  Treatment for this condition depends on the cause and your symptoms. You may need medicines, supplements, or regular monitoring of your condition. This information is not intended to replace advice given to you by your  health care provider. Make sure you discuss any questions you have with your health care provider. Document Revised: 11/14/2017 Document Reviewed: 08/28/2017 Elsevier Patient Education  2020 [employer].

## 2020-05-02 NOTE — Progress Notes (Signed)
Chief Complaint  Patient presents with  . Follow-up   F/u  1. CKD 3 a Dr. Holley Raring seen labs done 04/21/20 GFR 50 increased GFR to 65 04/21/20 ANA negative, SPEP negative, UPEP negative H/h 11.4/34.9 with proteinuria  2. uninate pancreas mass due for repeat CT or MRI ab pancreas series 11/21/20 3. MNG bx 04/04/20 negative  4. HTN on norvasc 5 mg qd, hctz 12.5 mg qd she had prior Rx lisinopril 10 mg qd and atenolol 25 mg but not sure if taking these  She is to message with Rx bottles of all meds or bring them back in BP controlled today  5. C/o itchy rash to back at times and scratching using dove soap and lavender   Review of Systems  Constitutional: Negative for weight loss.  HENT: Negative for hearing loss.   Eyes: Negative for blurred vision.  Respiratory: Negative for shortness of breath.   Cardiovascular: Negative for chest pain.  Gastrointestinal: Negative for abdominal pain.  Musculoskeletal: Negative for falls.  Skin: Positive for itching and rash.  Neurological: Negative for headaches.  Psychiatric/Behavioral: Negative for depression.   Past Medical History:  Diagnosis Date  . Anemia    iron deficiency and vitamin d deficiency  . Anxiety   . Bladder mass 11/2019  . Breast mass 10/2019   bilateral, ? cysts  . CKD (chronic kidney disease), stage III   . Depression   . Hypertension   . Insomnia   . Pre-diabetes   . Sickle cell trait ([place])   . Thyroid goiter   . Tubular adenoma of colon   . Wears contact lenses   . Wears dentures    upper and lower partials, also permanent retainer   Past Surgical History:  Procedure Laterality Date  . ABDOMINAL HYSTERECTOMY     for prolapse per pt with mesh ovaries still intact no h/o abnormal pap  . COLONOSCOPY WITH PROPOFOL N/A 06/10/2019   Procedure: COLONOSCOPY WITH PROPOFOL;  Surgeon: Lucilla Lame, MD;  Location: [facility];  Service: Endoscopy;  Laterality: N/A;  . CYSTOSCOPY WITH BIOPSY N/A 12/03/2019   Procedure:  CYSTOSCOPY WITH bladder BIOPSY;  Surgeon: Billey Co, MD;  Location: [facility];  Service: Urology;  Laterality: N/A;  . CYSTOSCOPY WITH FULGERATION N/A 12/03/2019   Procedure: CYSTOSCOPY WITH FULGERATION;  Surgeon: Billey Co, MD;  Location: [facility];  Service: Urology;  Laterality: N/A;  . ESOPHAGOGASTRODUODENOSCOPY (EGD) WITH PROPOFOL N/A 06/10/2019   Procedure: ESOPHAGOGASTRODUODENOSCOPY (EGD) WITH PROPOFOL;  Surgeon: Lucilla Lame, MD;  Location: [facility];  Service: Endoscopy;  Laterality: N/A;  . EYE SURGERY Bilateral    pterygium, both sides repaired  . GIVENS CAPSULE STUDY N/A 08/24/2019   Procedure: GIVENS CAPSULE STUDY;  Surgeon: Lucilla Lame, MD;  Location: [facility];  Service: Endoscopy;  Laterality: N/A;  . POLYPECTOMY  06/10/2019   Procedure: POLYPECTOMY;  Surgeon: Lucilla Lame, MD;  Location: [facility];  Service: Endoscopy;;   Family History  Problem Relation Age of Onset  . Breast cancer Mother 24  . Diabetes Mother   . Kidney failure Father   . Alcohol abuse Father   . Heart disease Father        chf  . Hyperlipidemia Father   . Other Other        fatty liver cousin  . Sickle cell anemia Other        nephew  . Cancer Other        aunt with ovarian cancer  .  Lung cancer Maternal Aunt   . Autism Grandson    Social History   Socioeconomic History  . Marital status: Divorced    Spouse name: Not on file  . Number of children: Not on file  . Years of education: Not on file  . Highest education level: Not on file  Occupational History  . Not on file  Tobacco Use  . Smoking status: Never Smoker  . Smokeless tobacco: Never Used  Substance and Sexual Activity  . Alcohol use: Never  . Drug use: Never  . Sexual activity: Yes    Birth control/protection: Surgical    Comment: Hysterectomy  Other Topics Concern  . Not on file  Social History Narrative   [employer] employee   2 kids daughter Corky Sing and son Llesuer Marcello Moores    Works homeless shelter weekends    Works [employer] in [place] as of 04/2020   Social Determinants of Health   Financial Resource Strain:   . Difficulty of Paying Living Expenses:   Food Insecurity:   . Worried About Charity fundraiser in the Last Year:   . Arboriculturist in the Last Year:   Transportation Needs:   . Film/video editor (Medical):   Marland Kitchen Lack of Transportation (Non-Medical):   Physical Activity:   . Days of Exercise per Week:   . Minutes of Exercise per Session:   Stress:   . Feeling of Stress :   Social Connections:   . Frequency of Communication with Friends and Family:   . Frequency of Social Gatherings with Friends and Family:   . Attends Religious Services:   . Active Member of Clubs or Organizations:   . Attends Archivist Meetings:   Marland Kitchen Marital Status:   Intimate Partner Violence:   . Fear of Current or Ex-Partner:   . Emotionally Abused:   Marland Kitchen Physically Abused:   . Sexually Abused:    Current Meds  Medication Sig  . amLODipine (NORVASC) 5 MG tablet Take 1 tablet (5 mg total) by mouth daily.  . Cholecalciferol (VITAMIN D) 50 MCG (2000 UT) CAPS Take 2,000 Units by mouth every 7 (seven) days.  Marland Kitchen ezetimibe (ZETIA) 10 MG tablet Take 1 tablet (10 mg total) by mouth daily.  . hydrochlorothiazide (HYDRODIURIL) 25 MG tablet Take 0.5 tablets (12.5 mg total) by mouth daily. In am (Patient taking differently: Take 25 mg by mouth daily. )  . levocetirizine (XYZAL) 5 MG tablet Take 5 mg by mouth every evening.  . montelukast (SINGULAIR) 10 MG tablet Take 10 mg by mouth at bedtime.  . temazepam (RESTORIL) 30 MG capsule Take 30 mg by mouth at bedtime as needed for sleep.  . TRULANCE 3 MG TABS Take 3 mg by mouth daily as needed (constipation).    Allergies  Allergen Reactions  . Sulfa Antibiotics Itching, Swelling and Other (See Comments)  . Wheat Bran Other (See Comments)    bloating  . Belsomra [Suvorexant]     Nightmares   . Ambien [Zolpidem  Tartrate] Other (See Comments)    Nightmares   . Aspirin Other (See Comments)    Gastric irritation  . Prednisone Palpitations  . Shellfish Allergy Itching  . Strawberry Extract Itching   Recent Results (from the past 2160 hour(s))  Urine Culture     Status: None   Collection Time: 03/22/20  8:42 AM   Specimen: Urine   UC  Result Value Ref Range   Urine Culture, Routine  Final report    Organism ID, Bacteria No growth   CBC with Differential/Platelet     Status: Abnormal   Collection Time: 03/22/20  8:42 AM  Result Value Ref Range   WBC 8.2 3.4 - 10.8 x10E3/uL   RBC 3.99 3.77 - 5.28 x10E6/uL   Hemoglobin 11.5 11.1 - 15.9 g/dL   Hematocrit 33.8 (L) 34.0 - 46.6 %   MCV 85 79 - 97 fL   MCH 28.8 26.6 - 33.0 pg   MCHC 34.0 31.5 - 35.7 g/dL   RDW 14.9 11.7 - 15.4 %   Platelets 287 150 - 450 x10E3/uL   Neutrophils 51 Not Estab. %   Lymphs 33 Not Estab. %   Monocytes 9 Not Estab. %   Eos 6 Not Estab. %   Basos 1 Not Estab. %   Neutrophils Absolute 4.2 1.4 - 7.0 x10E3/uL   Lymphocytes Absolute 2.7 0.7 - 3.1 x10E3/uL   Monocytes Absolute 0.7 0.1 - 0.9 x10E3/uL   EOS (ABSOLUTE) 0.5 (H) 0.0 - 0.4 x10E3/uL   Basophils Absolute 0.1 0.0 - 0.2 x10E3/uL   Immature Granulocytes 0 Not Estab. %   Immature Grans (Abs) 0.0 0.0 - 0.1 x10E3/uL  Comprehensive metabolic panel     Status: Abnormal   Collection Time: 03/22/20  8:42 AM  Result Value Ref Range   Glucose 101 (H) 65 - 99 mg/dL   BUN 25 8 - 27 mg/dL   Creatinine, Ser 1.32 (H) 0.57 - 1.00 mg/dL   GFR calc non Af Amer 43 (L) >59 mL/min/1.73   GFR calc Af Amer 50 (L) >59 mL/min/1.73   BUN/Creatinine Ratio 19 12 - 28   Sodium 145 (H) 134 - 144 mmol/L   Potassium 3.7 3.5 - 5.2 mmol/L   Chloride 104 96 - 106 mmol/L   CO2 25 20 - 29 mmol/L   Calcium 10.1 8.7 - 10.3 mg/dL   Total Protein 7.8 6.0 - 8.5 g/dL   Albumin 4.4 3.8 - 4.8 g/dL   Globulin, Total 3.4 1.5 - 4.5 g/dL   Albumin/Globulin Ratio 1.3 1.2 - 2.2   Bilirubin Total 0.3  0.0 - 1.2 mg/dL   Alkaline Phosphatase 145 (H) 39 - 117 IU/L   AST 19 0 - 40 IU/L   ALT 16 0 - 32 IU/L  Urinalysis, Routine w reflex microscopic     Status: Abnormal   Collection Time: 03/22/20  8:42 AM  Result Value Ref Range   Specific Gravity, UA 1.015 1.005 - 1.030   pH, UA 6.0 5.0 - 7.5   Color, UA Yellow Yellow   Appearance Ur Clear Clear   Leukocytes,UA Negative Negative   Protein,UA 1+ (A) Negative/Trace   Glucose, UA Negative Negative   Ketones, UA Negative Negative   RBC, UA Negative Negative   Bilirubin, UA Negative Negative   Urobilinogen, Ur 0.2 0.2 - 1.0 mg/dL   Nitrite, UA Negative Negative   Microscopic Examination See below:     Comment: Microscopic was indicated and was performed.  Microscopic Examination     Status: None   Collection Time: 03/22/20  8:42 AM  Result Value Ref Range   WBC, UA None seen 0 - 5 /hpf   RBC 0-2 0 - 2 /hpf   Epithelial Cells (non renal) 0-10 0 - 10 /hpf   Casts None seen None seen /lpf   Bacteria, UA None seen None seen/Few  Lipid panel     Status: Abnormal   Collection Time: 03/22/20  8:42  AM  Result Value Ref Range   Cholesterol, Total 202 (H) 100 - 199 mg/dL   Triglycerides 116 0 - 149 mg/dL   HDL 60 >39 mg/dL   VLDL Cholesterol Cal 21 5 - 40 mg/dL   LDL Chol Calc (NIH) 121 (H) 0 - 99 mg/dL   Chol/HDL Ratio 3.4 0.0 - 4.4 ratio    Comment:                                   T. Chol/HDL Ratio                                             Men  Women                               1/2 Avg.Risk  3.4    3.3                                   Avg.Risk  5.0    4.4                                2X Avg.Risk  9.6    7.1                                3X Avg.Risk 23.4   11.0   Microalbumin / creatinine urine ratio     Status: Abnormal   Collection Time: 03/22/20  8:42 AM  Result Value Ref Range   Creatinine, Urine 69.4 Not Estab. mg/dL   Microalbumin, Urine 176.6 Not Estab. ug/mL   Microalb/Creat Ratio 254 (H) 0 - 29 mg/g creat     Comment:                        Normal:                0 -  29                        Moderately increased: 30 - 300                        Severely increased:       >300   Hemoglobin A1c     Status: Abnormal   Collection Time: 03/22/20  8:42 AM  Result Value Ref Range   Hgb A1c MFr Bld 6.2 (H) 4.8 - 5.6 %    Comment:          Prediabetes: 5.7 - 6.4          Diabetes: >6.4          Glycemic control for adults with diabetes: <7.0    Est. average glucose Bld gHb Est-mCnc 131 mg/dL  VITAMIN D 25 Hydroxy (Vit-D Deficiency, Fractures)     Status: None   Collection Time: 03/22/20  8:42 AM  Result Value Ref Range   Vit D, 25-Hydroxy 33.6 30.0 - 100.0 ng/mL    Comment: Vitamin D deficiency has  been defined by the [facility] practice guideline as a level of serum 25-OH vitamin D less than 20 ng/mL (1,2). The Endocrine Society went on to further define vitamin D insufficiency as a level between 21 and 29 ng/mL (2). 1. IOM (Institute of Medicine). 2010. Dietary reference    intakes for calcium and D. [place]: The    [employer]. 2. Holick MF, Binkley [place], Bischoff-Ferrari HA, et al.    Evaluation, treatment, and prevention of vitamin D    deficiency: an Endocrine Society clinical practice    guideline. JCEM. 2011 Jul; 96(7):1911-30.   HIV Antibody (routine testing w rflx)     Status: None   Collection Time: 03/22/20  8:42 AM  Result Value Ref Range   HIV Screen 4th Generation wRfx Non Reactive Non Reactive   Objective  Body mass index is 30.34 kg/m. Wt Readings from Last 3 Encounters:  05/02/20 188 lb (85.3 kg)  01/14/20 190 lb (86.2 kg)  12/21/19 189 lb (85.7 kg)   Temp Readings from Last 3 Encounters:  05/02/20 (!) 97.4 F (36.3 C) (Temporal)  12/03/19 (!) 96.9 F (36.1 C)  10/14/19 (!) 97 F (36.1 C) (Oral)   BP Readings from Last 3 Encounters:  05/02/20 126/76  12/21/19 133/85  12/03/19 (!) 153/84   Pulse Readings  from Last 3 Encounters:  05/02/20 89  12/21/19 91  12/03/19 61    Physical Exam Vitals and nursing note reviewed.  Constitutional:      Appearance: Normal appearance. She is well-developed and well-groomed. She is obese.  HENT:     Head: Normocephalic and atraumatic.  Eyes:     Conjunctiva/sclera: Conjunctivae normal.     Pupils: Pupils are equal, round, and reactive to light.  Cardiovascular:     Rate and Rhythm: Normal rate and regular rhythm.     Heart sounds: Normal heart sounds.  Pulmonary:     Effort: Pulmonary effort is normal.     Breath sounds: Normal breath sounds.  Skin:    General: Skin is warm and dry.  Neurological:     General: No focal deficit present.     Mental Status: She is alert and oriented to person, place, and time. Mental status is at baseline.     Gait: Gait normal.  Psychiatric:        Attention and Perception: Attention and perception normal.        Mood and Affect: Mood and affect normal.        Speech: Speech normal.        Behavior: Behavior normal. Behavior is cooperative.        Thought Content: Thought content normal.        Cognition and Memory: Cognition and memory normal.        Judgment: Judgment normal.     Assessment  Plan  Essential hypertension Confirm meds with pt to bring in or scan bottles of meds  Cont norvasc 5 mg and hctz 12.5 vs 25 mg qd  Confirm with Dr. Holley Raring if should be on lis 10 mg qd   Eczema, unspecified type - Plan: triamcinolone cream (KENALOG) 0.1 % To back upper with back applicator   Multinodular goiter [facility] endocrine bx neg 03/2020   Stage 3a chronic kidney disease Stage 2 chronic kidney disease 04/21/20 GFR from 50 to 65 improved F/u renal Dr. Holley Raring  cc'ed him as of 05/02/20 she did not hear about labs 04/21/20  Pancreas cyst Kidney cysts ? Pelvic mass per CT hematuria w/u 11/22/19 MRI ab w and w/o contrast repeat 11/21/20 1 year f/u  MRI pelvis w and w/o contrast   Obesity (BMI 30-39.9) rec healthy  diet and exercise   HM Fasting labs 03/2020  Flu utd 2020 covid 19 2/2 moderna will CC pic of chart at of 05/02/20 Requested records of vaccines from prior PCP Consider Tdap and shingrix Hep C negative    mamo sch 9/17/20[place] 09/02/19 right breast cyst benign and cluster of cysts left breast  -left breast cysts f/u US and dx mammogram due in 6 months orderedsch 03/02/20  -referred mammo due 09/01/20 wants to schedule in [place]  S/p hysterectomy no h/o abnormal pap, ovaries intact consider pelvic in future doing pelvic MRI see above   Dexa consider age 9   colonoscopy Dr. Allen Norris 06/10/19/egd chronic gastritis, tubular adenoma f/u in 5 years rec healthy diet and exercise  Consider CT/MRI abdomen pancreatic cyst uncinate 1.9 from 1.4 cm likely benign to ensure stability 11/21/20  See above   10/05/19 US thyroid  -->appt to f/u 02/03/20 Dr. Ladell Pier; f/u  04/04/20 neg bx  IMPRESSION: Enlarging TI-RADS category 3 nodule occupying the left mid and inferior gland now measuring up to 5.9 cm compared to 5.1 cm previously. This lesion continues to meet criteria for consideration of fine-needle aspiration biopsy. -see above)  f/u 07/10/20 [facility] endocrine   Provider: Dr. Olivia Mackie McLean-Scocuzza-Internal Medicine

## 2020-05-08 ENCOUNTER — Telehealth: Payer: Self-pay | Admitting: Internal Medicine

## 2020-05-08 NOTE — Telephone Encounter (Signed)
Faxed through Epic routing

## 2020-05-08 NOTE — Telephone Encounter (Signed)
-----   Message from Delorise Jackson, MD sent at 05/02/2020  6:16 PM EDT ----- Fax note renal and on on cover attn Anthonette Legato, MD [address] [place], [place] [address] [phone] (Work) [phone] (Fax1. Pt unsure of BP meds for sure norvasc 5 and hctz 12.5 to 25 we are following up on this and she is to take a photo of bottles or bring to clinic Your note states on atenolol 25 mg qd and lisinopril 10 mg but today pt states she is not taking this -should she be on lisinopril 10 mg with proteinuria? Please let me know I prefer [facility] over [facility]. Pt never heard from labs 04/21/20 are you concerned about elevated protein in blood/urine? [place]

## 2020-05-29 DIAGNOSIS — I10 Essential (primary) hypertension: Secondary | ICD-10-CM | POA: Diagnosis not present

## 2020-05-29 DIAGNOSIS — N182 Chronic kidney disease, stage 2 (mild): Secondary | ICD-10-CM | POA: Diagnosis not present

## 2020-05-29 DIAGNOSIS — N2581 Secondary hyperparathyroidism of renal origin: Secondary | ICD-10-CM | POA: Diagnosis not present

## 2020-05-29 DIAGNOSIS — R809 Proteinuria, unspecified: Secondary | ICD-10-CM | POA: Diagnosis not present

## 2020-05-29 DIAGNOSIS — D631 Anemia in chronic kidney disease: Secondary | ICD-10-CM | POA: Diagnosis not present

## 2020-06-12 DIAGNOSIS — N1831 Chronic kidney disease, stage 3a: Secondary | ICD-10-CM | POA: Diagnosis not present

## 2020-06-12 DIAGNOSIS — I10 Essential (primary) hypertension: Secondary | ICD-10-CM | POA: Diagnosis not present

## 2020-06-16 ENCOUNTER — Other Ambulatory Visit: Payer: Self-pay | Admitting: Internal Medicine

## 2020-06-16 MED ORDER — CITALOPRAM HYDROBROMIDE 10 MG PO TABS: 10.0000 mg | ORAL_TABLET | Freq: Every day | ORAL | 3 refills | Status: DC

## 2020-07-10 DIAGNOSIS — E042 Nontoxic multinodular goiter: Secondary | ICD-10-CM | POA: Diagnosis not present

## 2020-07-10 DIAGNOSIS — E0590 Thyrotoxicosis, unspecified without thyrotoxic crisis or storm: Secondary | ICD-10-CM | POA: Diagnosis not present

## 2020-07-13 ENCOUNTER — Other Ambulatory Visit: Payer: Self-pay | Admitting: "Endocrinology

## 2020-07-13 DIAGNOSIS — E0590 Thyrotoxicosis, unspecified without thyrotoxic crisis or storm: Secondary | ICD-10-CM

## 2020-07-13 DIAGNOSIS — E042 Nontoxic multinodular goiter: Secondary | ICD-10-CM

## 2020-07-19 ENCOUNTER — Other Ambulatory Visit: Payer: Self-pay | Admitting: Internal Medicine

## 2020-07-19 DIAGNOSIS — G4700 Insomnia, unspecified: Secondary | ICD-10-CM

## 2020-07-19 MED ORDER — TEMAZEPAM 30 MG PO CAPS: 30.0000 mg | ORAL_CAPSULE | Freq: Every evening | ORAL | 5 refills | Status: DC | PRN

## 2020-07-21 DIAGNOSIS — R809 Proteinuria, unspecified: Secondary | ICD-10-CM | POA: Diagnosis not present

## 2020-07-25 ENCOUNTER — Other Ambulatory Visit: Payer: Self-pay

## 2020-07-25 ENCOUNTER — Ambulatory Visit: Payer: Federal, State, Local not specified - PPO | Admitting: Internal Medicine

## 2020-07-25 ENCOUNTER — Encounter: Payer: Self-pay | Admitting: Internal Medicine

## 2020-07-25 ENCOUNTER — Ambulatory Visit (INDEPENDENT_AMBULATORY_CARE_PROVIDER_SITE_OTHER): Payer: Federal, State, Local not specified - PPO

## 2020-07-25 VITALS — BP 126/78 | HR 101 | Temp 98.6°F | Ht 66.0 in | Wt 186.8 lb

## 2020-07-25 DIAGNOSIS — F4321 Adjustment disorder with depressed mood: Secondary | ICD-10-CM

## 2020-07-25 DIAGNOSIS — R1032 Left lower quadrant pain: Secondary | ICD-10-CM | POA: Diagnosis not present

## 2020-07-25 DIAGNOSIS — E0590 Thyrotoxicosis, unspecified without thyrotoxic crisis or storm: Secondary | ICD-10-CM

## 2020-07-25 DIAGNOSIS — E669 Obesity, unspecified: Secondary | ICD-10-CM | POA: Diagnosis not present

## 2020-07-25 MED ORDER — TRAMADOL HCL 50 MG PO TABS: 50.0000 mg | ORAL_TABLET | Freq: Two times a day (BID) | ORAL | 0 refills | Status: AC | PRN

## 2020-07-25 NOTE — Patient Instructions (Addendum)
Try voltaren gel if arthritis   07/10/2020 Office Visit [facility]  [address]  [place], [place] [address]  [phone]  Toni Arthurs, [address]  [place], [place] [address]  [phone]  [phone] (Fax)      Hip Exercises Ask your health care provider which exercises are safe for you. Do exercises exactly as told by your health care provider and adjust them as directed. It is normal to feel mild stretching, pulling, tightness, or discomfort as you do these exercises. Stop right away if you feel sudden pain or your pain gets worse. Do not begin these exercises until told by your health care provider. Stretching and range-of-motion exercises These exercises warm up your muscles and joints and improve the movement and flexibility of your hip. These exercises also help to relieve pain, numbness, and tingling. You may be asked to limit your range of motion if you had a hip replacement. Talk to your health care provider about these restrictions. Hamstrings, supine  1. Lie on your back (supine position). 2. Loop a belt or towel over the ball of your left / right foot. The ball of your foot is on the walking surface, right under your toes. 3. Straighten your left / right knee and slowly pull on the belt or towel to raise your leg until you feel a gentle stretch behind your knee (hamstring). ? Do not let your knee bend while you do this. ? Keep your other leg flat on the floor. 4. Hold this position for __________ seconds. 5. Slowly return your leg to the starting position. Repeat __________ times. Complete this exercise __________ times a day. Hip rotation  1. Lie on your back on a firm surface. 2. With your left / right hand, gently pull your left / right knee toward the shoulder that is on the same side of the body. Stop when your knee is pointing toward the ceiling. 3. Hold your left / right ankle with your other hand. 4. Keeping your  knee steady, gently pull your left / right ankle toward your other shoulder until you feel a stretch in your buttocks. ? Keep your hips and shoulders firmly planted while you do this stretch. 5. Hold this position for __________ seconds. Repeat __________ times. Complete this exercise __________ times a day. Seated stretch This exercise is sometimes called hamstrings and adductors stretch. 1. Sit on the floor with your legs stretched wide. Keep your knees straight during this exercise. 2. Keeping your head and back in a straight line, bend at your waist to reach for your left foot (position A). You should feel a stretch in your right inner thigh (adductors). 3. Hold this position for __________ seconds. Then slowly return to the upright position. 4. Keeping your head and back in a straight line, bend at your waist to reach forward (position B). You should feel a stretch behind both of your thighs and knees (hamstrings). 5. Hold this position for __________ seconds. Then slowly return to the upright position. 6. Keeping your head and back in a straight line, bend at your waist to reach for your right foot (position C). You should feel a stretch in your left inner thigh (adductors). 7. Hold this position for __________ seconds. Then slowly return to the upright position. Repeat __________ times. Complete this exercise __________ times a day. Lunge This exercise stretches the muscles of the hip (hip flexors). 1. Place your left / right knee on the floor and  bend your other knee so that is directly over your ankle. You should be half-kneeling. 2. Keep good posture with your head over your shoulders. 3. Tighten your buttocks to point your tailbone downward. This will prevent your back from arching too much. 4. You should feel a gentle stretch in the front of your left / right thigh and hip. If you do not feel a stretch, slide your other foot forward slightly and then slowly lunge forward with your  chest up until your knee once again lines up over your ankle. ? Make sure your tailbone continues to point downward. 5. Hold this position for __________ seconds. 6. Slowly return to the starting position. Repeat __________ times. Complete this exercise __________ times a day. Strengthening exercises These exercises build strength and endurance in your hip. Endurance is the ability to use your muscles for a Killmer time, even after they get tired. Bridge This exercise strengthens the muscles of your hip (hip extensors). 1. Lie on your back on a firm surface with your knees bent and your feet flat on the floor. 2. Tighten your buttocks muscles and lift your bottom off the floor until the trunk of your body and your hips are level with your thighs. ? Do not arch your back. ? You should feel the muscles working in your buttocks and the back of your thighs. If you do not feel these muscles, slide your feet 1-2 inches (2.5-5 cm) farther away from your buttocks. 3. Hold this position for __________ seconds. 4. Slowly lower your hips to the starting position. 5. Let your muscles relax completely between repetitions. Repeat __________ times. Complete this exercise __________ times a day. Straight leg raises, side-lying This exercise strengthens the muscles that move the hip joint away from the center of the body (hip abductors). 1. Lie on your side with your left / right leg in the top position. Lie so your head, shoulder, hip, and knee line up. You may bend your bottom knee slightly to help you balance. 2. Roll your hips slightly forward, so your hips are stacked directly over each other and your left / right knee is facing forward. 3. Leading with your heel, lift your top leg 4-6 inches (10-15 cm). You should feel the muscles in your top hip lifting. ? Do not let your foot drift forward. ? Do not let your knee roll toward the ceiling. 4. Hold this position for __________ seconds. 5. Slowly return to  the starting position. 6. Let your muscles relax completely between repetitions. Repeat __________ times. Complete this exercise __________ times a day. Straight leg raises, side-lying This exercise strengthens the muscles that move the hip joint toward the center of the body (hip adductors). 1. Lie on your side with your left / right leg in the bottom position. Lie so your head, shoulder, hip, and knee line up. You may place your upper foot in front to help you balance. 2. Roll your hips slightly forward, so your hips are stacked directly over each other and your left / right knee is facing forward. 3. Tense the muscles in your inner thigh and lift your bottom leg 4-6 inches (10-15 cm). 4. Hold this position for __________ seconds. 5. Slowly return to the starting position. 6. Let your muscles relax completely between repetitions. Repeat __________ times. Complete this exercise __________ times a day. Straight leg raises, supine This exercise strengthens the muscles in the front of your thigh (quadriceps). 1. Lie on your back (supine position) with  your left / right leg extended and your other knee bent. 2. Tense the muscles in the front of your left / right thigh. You should see your kneecap slide up or see increased dimpling just above your knee. 3. Keep these muscles tight as you raise your leg 4-6 inches (10-15 cm) off the floor. Do not let your knee bend. 4. Hold this position for __________ seconds. 5. Keep these muscles tense as you lower your leg. 6. Relax the muscles slowly and completely between repetitions. Repeat __________ times. Complete this exercise __________ times a day. Hip abductors, standing This exercise strengthens the muscles that move the leg and hip joint away from the center of the body (hip abductors). 1. Tie one end of a rubber exercise band or tubing to a secure surface, such as a chair, table, or pole. 2. Loop the other end of the band or tubing around your  left / right ankle. 3. Keeping your ankle with the band or tubing directly opposite the secured end, step away until there is tension in the tubing or band. Hold on to a chair, table, or pole as needed for balance. 4. Lift your left / right leg out to your side. While you do this: ? Keep your back upright. ? Keep your shoulders over your hips. ? Keep your toes pointing forward. ? Make sure to use your hip muscles to slowly lift your leg. Do not tip your body or forcefully lift your leg. 5. Hold this position for __________ seconds. 6. Slowly return to the starting position. Repeat __________ times. Complete this exercise __________ times a day. Squats This exercise strengthens the muscles in the front of your thigh (quadriceps). 1. Stand in a door frame so your feet and knees are in line with the frame. You may place your hands on the frame for balance. 2. Slowly bend your knees and lower your hips like you are going to sit in a chair. ? Keep your lower legs in a straight-up-and-down position. ? Do not let your hips go lower than your knees. ? Do not bend your knees lower than told by your health care provider. ? If your hip pain increases, do not bend as low. 3. Hold this position for ___________ seconds. 4. Slowly push with your legs to return to standing. Do not use your hands to pull yourself to standing. Repeat __________ times. Complete this exercise __________ times a day. This information is not intended to replace advice given to you by your health care provider. Make sure you discuss any questions you have with your health care provider. Document Revised: 07/08/2019 Document Reviewed: 10/13/2018 Elsevier Patient Education  [place].  Hip Pain The hip is the joint between the upper legs and the lower pelvis. The bones, cartilage, tendons, and muscles of your hip joint support your body and allow you to move around. Hip pain can range from a minor ache to severe pain in  one or both of your hips. The pain may be felt on the inside of the hip joint near the groin, or on the outside near the buttocks and upper thigh. You may also have swelling or stiffness in your hip area. Follow these instructions at home: Managing pain, stiffness, and swelling      If directed, put ice on the painful area. To do this: ? Put ice in a plastic bag. ? Place a towel between your skin and the bag. ? Leave the ice on for 20  minutes, 2-3 times a day.  If directed, apply heat to the affected area as often as told by your health care provider. Use the heat source that your health care provider recommends, such as a moist heat pack or a heating pad. ? Place a towel between your skin and the heat source. ? Leave the heat on for 20-30 minutes. ? Remove the heat if your skin turns bright red. This is especially important if you are unable to feel pain, heat, or cold. You may have a greater risk of getting burned. Activity  Do exercises as told by your health care provider.  Avoid activities that cause pain. General instructions   Take over-the-counter and prescription medicines only as told by your health care provider.  Keep a journal of your symptoms. Write down: ? How often you have hip pain. ? The location of your pain. ? What the pain feels like. ? What makes the pain worse.  Sleep with a pillow between your legs on your most comfortable side.  Keep all follow-up visits as told by your health care provider. This is important. Contact a health care provider if:  You cannot put weight on your leg.  Your pain or swelling continues or gets worse after one week.  It gets harder to walk.  You have a fever. Get help right away if:  You fall.  You have a sudden increase in pain and swelling in your hip.  Your hip is red or swollen or very tender to touch. Summary  Hip pain can range from a minor ache to severe pain in one or both of your hips.  The pain may be  felt on the inside of the hip joint near the groin, or on the outside near the buttocks and upper thigh.  Avoid activities that cause pain.  Write down how often you have hip pain, the location of the pain, what makes it worse, and what it feels like. This information is not intended to replace advice given to you by your health care provider. Make sure you discuss any questions you have with your health care provider. Document Revised: 04/19/2019 Document Reviewed: 04/19/2019 Elsevier Patient Education  [place].

## 2020-07-25 NOTE — Progress Notes (Signed)
Patient presenting with left sided upper groin pain. Onset of last week with a pain level of 5/10. States no know injuries, falls, or strains. No new stretching or physical activity. States she has had this type of pain pop up before.

## 2020-07-25 NOTE — Progress Notes (Signed)
Chief Complaint  Patient presents with  . Groin Pain   F/u  1. Left groin pain worse x 1 week but had prior years ago with h/o sciatica denies low back pain. Pain constant 4-5/10 Tylenol tried and helped in the past   2.grief dad died and funeral planned in 1 week from now and family stressors but overall doing well   3. Hyperthyroidism seeing [facility] 07/26/20 RAI tx 2nd tx    Review of Systems  Constitutional: Negative for weight loss.  HENT: Negative for hearing loss.   Eyes: Negative for blurred vision.  Respiratory: Negative for shortness of breath.   Cardiovascular: Negative for chest pain.  Gastrointestinal: Negative for abdominal pain.  Musculoskeletal: Positive for joint pain.  Skin: Negative for rash.  Psychiatric/Behavioral:       +grief and stress    Past Medical History:  Diagnosis Date  . Anemia    iron deficiency and vitamin d deficiency  . Anxiety   . Bladder mass 11/2019  . Breast mass 10/2019   bilateral, ? cysts  . CKD (chronic kidney disease), stage III   . Depression   . Hypertension   . Insomnia   . Pre-diabetes   . Sickle cell trait ([place])   . Thyroid goiter   . Tubular adenoma of colon   . Wears contact lenses   . Wears dentures    upper and lower partials, also permanent retainer   Past Surgical History:  Procedure Laterality Date  . ABDOMINAL HYSTERECTOMY     for prolapse per pt with mesh ovaries still intact no h/o abnormal pap  . COLONOSCOPY WITH PROPOFOL N/A 06/10/2019   Procedure: COLONOSCOPY WITH PROPOFOL;  Surgeon: Lucilla Lame, MD;  Location: [facility];  Service: Endoscopy;  Laterality: N/A;  . CYSTOSCOPY WITH BIOPSY N/A 12/03/2019   Procedure: CYSTOSCOPY WITH bladder BIOPSY;  Surgeon: Billey Co, MD;  Location: [facility];  Service: Urology;  Laterality: N/A;  . CYSTOSCOPY WITH FULGERATION N/A 12/03/2019   Procedure: CYSTOSCOPY WITH FULGERATION;  Surgeon: Billey Co, MD;  Location: [facility];  Service:  Urology;  Laterality: N/A;  . ESOPHAGOGASTRODUODENOSCOPY (EGD) WITH PROPOFOL N/A 06/10/2019   Procedure: ESOPHAGOGASTRODUODENOSCOPY (EGD) WITH PROPOFOL;  Surgeon: Lucilla Lame, MD;  Location: [facility];  Service: Endoscopy;  Laterality: N/A;  . EYE SURGERY Bilateral    pterygium, both sides repaired  . GIVENS CAPSULE STUDY N/A 08/24/2019   Procedure: GIVENS CAPSULE STUDY;  Surgeon: Lucilla Lame, MD;  Location: [facility];  Service: Endoscopy;  Laterality: N/A;  . POLYPECTOMY  06/10/2019   Procedure: POLYPECTOMY;  Surgeon: Lucilla Lame, MD;  Location: [facility];  Service: Endoscopy;;   Family History  Problem Relation Age of Onset  . Breast cancer Mother 47  . Diabetes Mother   . Kidney failure Father   . Alcohol abuse Father   . Heart disease Father        chf  . Hyperlipidemia Father   . Other Other        fatty liver cousin  . Sickle cell anemia Other        nephew  . Cancer Other        aunt with ovarian cancer  . Lung cancer Maternal Aunt   . Autism Grandson    Social History   Socioeconomic History  . Marital status: Divorced    Spouse name: Not on file  . Number of children: Not on file  . Years of education: Not  on file  . Highest education level: Not on file  Occupational History  . Not on file  Tobacco Use  . Smoking status: Never Smoker  . Smokeless tobacco: Never Used  Vaping Use  . Vaping Use: Never used  Substance and Sexual Activity  . Alcohol use: Never  . Drug use: Never  . Sexual activity: Yes    Birth control/protection: Surgical    Comment: Hysterectomy  Other Topics Concern  . Not on file  Social History Narrative   [employer] employee   2 kids daughter Corky Sing and son Llesuer Marcello Moores   Works homeless shelter weekends    Works [employer] in [place] as of 04/2020   Social Determinants of Health   Financial Resource Strain:   . Difficulty of Paying Living Expenses:   Food Insecurity:   . Worried About Ship broker in the Last Year:   . Arboriculturist in the Last Year:   Transportation Needs:   . Film/video editor (Medical):   Marland Kitchen Lack of Transportation (Non-Medical):   Physical Activity:   . Days of Exercise per Week:   . Minutes of Exercise per Session:   Stress:   . Feeling of Stress :   Social Connections:   . Frequency of Communication with Friends and Family:   . Frequency of Social Gatherings with Friends and Family:   . Attends Religious Services:   . Active Member of Clubs or Organizations:   . Attends Archivist Meetings:   Marland Kitchen Marital Status:   Intimate Partner Violence:   . Fear of Current or Ex-Partner:   . Emotionally Abused:   Marland Kitchen Physically Abused:   . Sexually Abused:    Current Meds  Medication Sig  . amLODipine (NORVASC) 5 MG tablet Take 1 tablet (5 mg total) by mouth daily.  . Cholecalciferol (VITAMIN D) 50 MCG (2000 UT) CAPS Take 2,000 Units by mouth every 7 (seven) days.  . citalopram (CELEXA) 10 MG tablet Take 1 tablet (10 mg total) by mouth daily.  Marland Kitchen ezetimibe (ZETIA) 10 MG tablet Take 1 tablet (10 mg total) by mouth daily.  . hydrochlorothiazide (HYDRODIURIL) 25 MG tablet Take 0.5 tablets (12.5 mg total) by mouth daily. In am (Patient taking differently: Take 25 mg by mouth daily. )  . levocetirizine (XYZAL) 5 MG tablet Take 5 mg by mouth every evening.  . montelukast (SINGULAIR) 10 MG tablet Take 10 mg by mouth at bedtime.  . temazepam (RESTORIL) 30 MG capsule Take 1 capsule (30 mg total) by mouth at bedtime as needed for sleep.  Marland Kitchen triamcinolone cream (KENALOG) 0.1 % Apply 1 application topically 2 (two) times daily as needed.  . TRULANCE 3 MG TABS Take 3 mg by mouth daily as needed (constipation).    Allergies  Allergen Reactions  . Sulfa Antibiotics Itching, Swelling and Other (See Comments)  . Wheat Bran Other (See Comments)    bloating  . Belsomra [Suvorexant]     Nightmares   . Ambien [Zolpidem Tartrate] Other (See Comments)     Nightmares   . Aspirin Other (See Comments)    Gastric irritation  . Prednisone Palpitations  . Shellfish Allergy Itching  . Strawberry Extract Itching   No results found for this or any previous visit (from the past 2160 hour(s)). Objective  Body mass index is 30.15 kg/m. Wt Readings from Last 3 Encounters:  07/25/20 186 lb 12.8 oz (84.7 kg)  05/02/20 188 lb (85.3  kg)  01/14/20 190 lb (86.2 kg)   Temp Readings from Last 3 Encounters:  07/25/20 98.6 F (37 C) (Oral)  05/02/20 (!) 97.4 F (36.3 C) (Temporal)  12/03/19 (!) 96.9 F (36.1 C)   BP Readings from Last 3 Encounters:  07/25/20 126/78  05/02/20 126/76  12/21/19 133/85   Pulse Readings from Last 3 Encounters:  07/25/20 (!) 101  05/02/20 89  12/21/19 91    Physical Exam Vitals and nursing note reviewed.  Constitutional:      Appearance: Normal appearance. She is well-developed and well-groomed. She is obese.  HENT:     Head: Normocephalic and atraumatic.  Eyes:     Conjunctiva/sclera: Conjunctivae normal.     Pupils: Pupils are equal, round, and reactive to light.  Cardiovascular:     Rate and Rhythm: Normal rate and regular rhythm.     Heart sounds: Normal heart sounds. No murmur heard.   Pulmonary:     Effort: Pulmonary effort is normal.     Breath sounds: Normal breath sounds.  Abdominal:     General: Abdomen is flat. Bowel sounds are normal.     Tenderness: There is no abdominal tenderness.  Skin:    General: Skin is warm and dry.  Neurological:     General: No focal deficit present.     Mental Status: She is alert and oriented to person, place, and time. Mental status is at baseline.     Gait: Gait normal.  Psychiatric:        Attention and Perception: Attention and perception normal.        Mood and Affect: Mood and affect normal.        Speech: Speech normal.        Behavior: Behavior normal. Behavior is cooperative.        Thought Content: Thought content normal.        Cognition and  Memory: Cognition and memory normal.        Judgment: Judgment normal.     Assessment  Plan  Left groin pain - Plan: DG HIP UNILAT WITH PELVIS 2-3 VIEWS LEFT, traMADol (ULTRAM) 50 MG tablet Consider [place] to r/o hernia as well  Given exercises   Hyperthyroidism RAI 07/26/20 2nd tx  F/u [facility]  Grief Doing ok   Obesity (BMI 30-39.9)  rec healthy diet and exercise   Hm HM Fasting labs 03/2020  Flu utd 2020 covid 19 2/2 moderna will CC pic of chart at of 05/02/20 Requested records of vaccines from prior PCP Consider Tdap and shingrix Hep C negative    mamo sch 9/17/20[place] 09/02/19 right breast cyst benign and cluster of cysts left breast  -left breast cysts f/u US and dx mammogram due in 6 months orderedsch 03/02/20  -referred mammo due 09/01/20 wants to schedule in [place]  S/p hysterectomy no h/o abnormal pap, ovaries intact consider pelvic in future doing pelvic MRI see above   Dexa consider age 87   colonoscopy Dr. Allen Norris 06/10/19/egd chronic gastritis, tubular adenoma f/u in 5 years rec healthy diet and exercise  Consider CT/MRI abdomen pancreatic cyst uncinate 1.9 from 1.4 cm likely benign to ensure stability 11/21/20 See above   10/05/19 US thyroid  -->appt to f/u 02/03/20 Dr. Ladell Pier; f/u  04/04/20 neg bx  IMPRESSION: Enlarging TI-RADS category 3 nodule occupying the left mid and inferior gland now measuring up to 5.9 cm compared to 5.1 cm previously. This lesion continues to meet criteria for consideration of fine-needle aspiration biopsy. -  see above)  f/u 07/10/20 [facility] endocrine    Provider: Dr. Olivia Mackie [facility]

## 2020-07-26 ENCOUNTER — Encounter
Admission: RE | Admit: 2020-07-26 | Discharge: 2020-07-26 | Disposition: A | Discharge status: Home / Self Care | Payer: Federal, State, Local not specified - PPO | Source: Ambulatory Visit | Attending: "Endocrinology | Admitting: "Endocrinology

## 2020-07-26 DIAGNOSIS — E042 Nontoxic multinodular goiter: Secondary | ICD-10-CM

## 2020-07-26 DIAGNOSIS — E0590 Thyrotoxicosis, unspecified without thyrotoxic crisis or storm: Secondary | ICD-10-CM | POA: Diagnosis not present

## 2020-07-26 IMAGING — MG MM DIGITAL DIAGNOSTIC BILAT W/ TOMO W/ CAD
8 series · 8 of 24 positions shown · non-contrast
Comparison: Previous exam(s).

CLINICAL DATA: Patient recalled from screening for right breast
asymmetry and left breast mass.

EXAM:
DIGITAL DIAGNOSTIC BILATERAL MAMMOGRAM WITH CAD AND TOMO
ULTRASOUND BILATERAL BREAST

[L CC synth-2D]
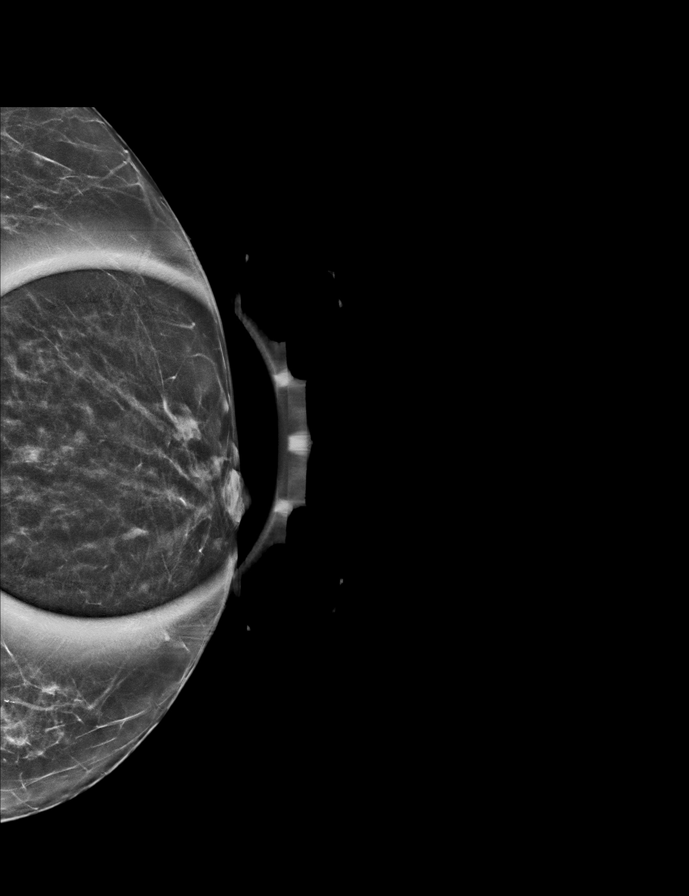

[L MLO synth-2D]
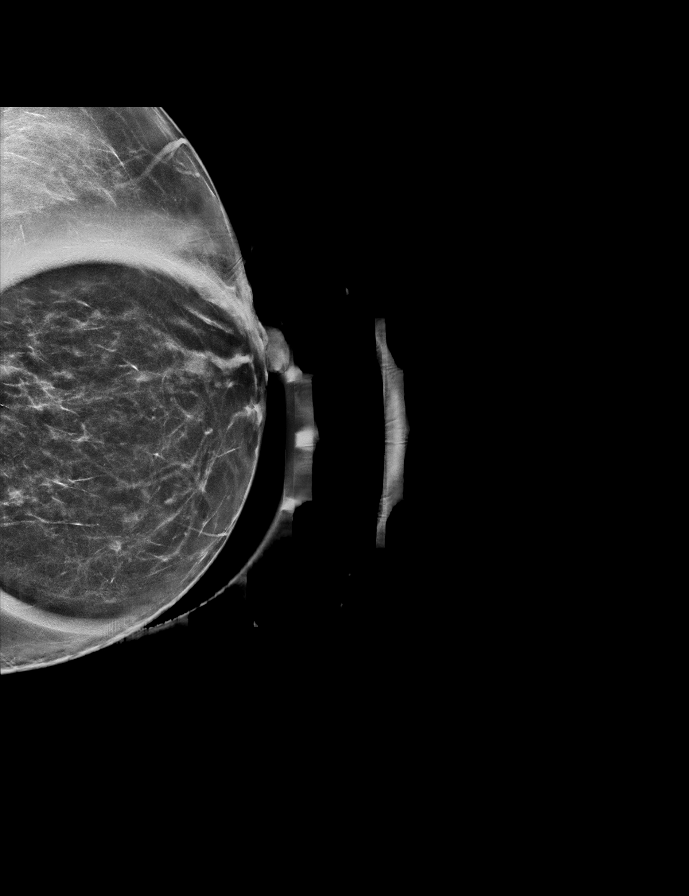

[R CC synth-2D]
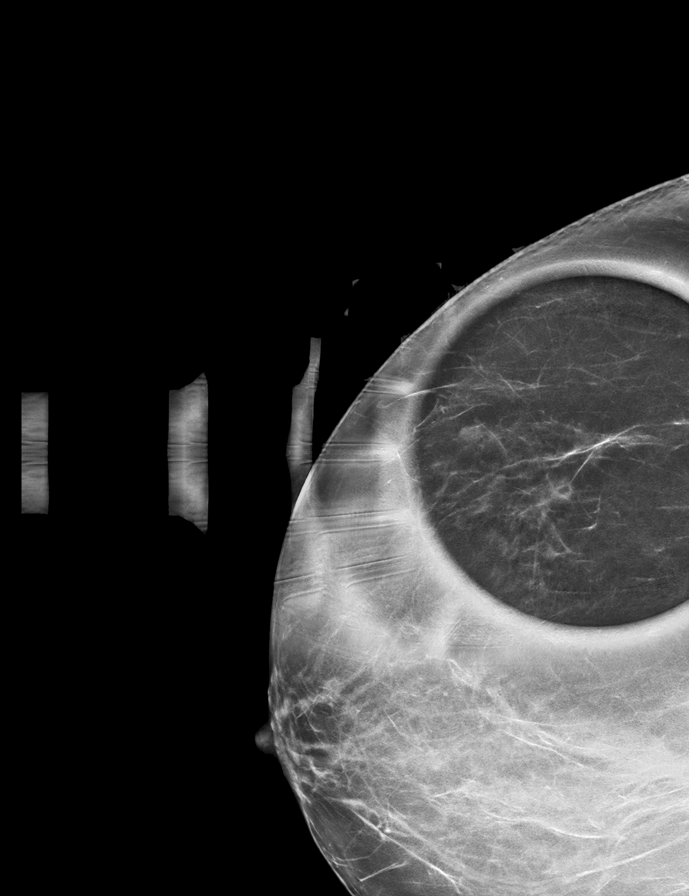

[R ML synth-2D]
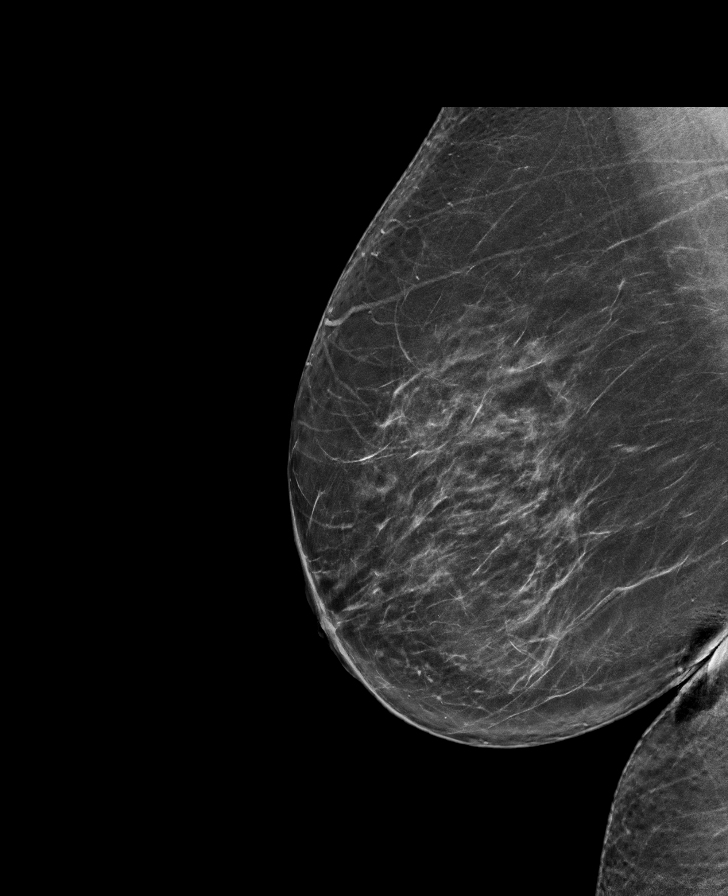

[L MLO tomo · tomo slice 33/66.0]
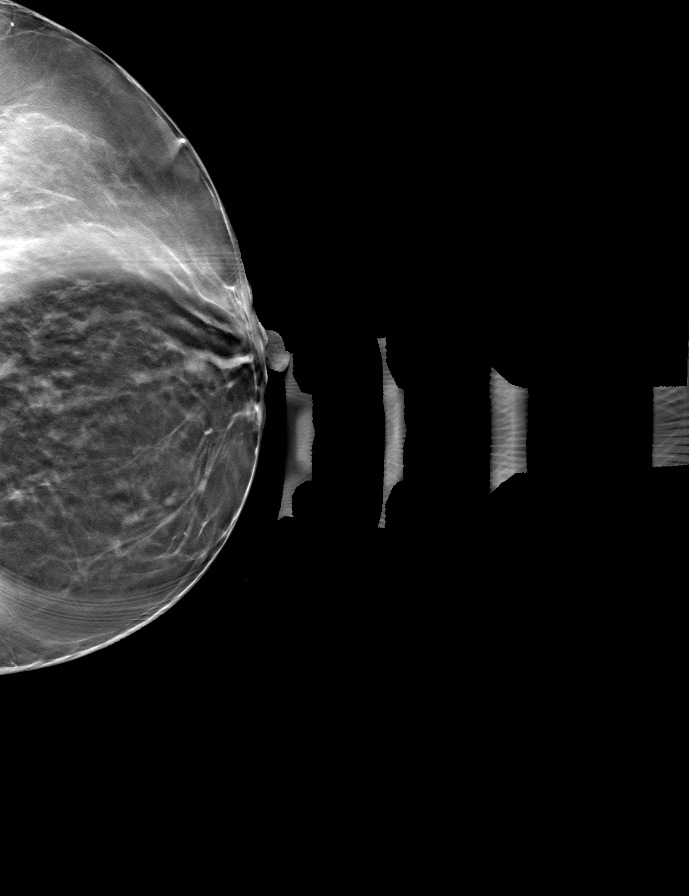

[L CC tomo · tomo slice 28/55.0]
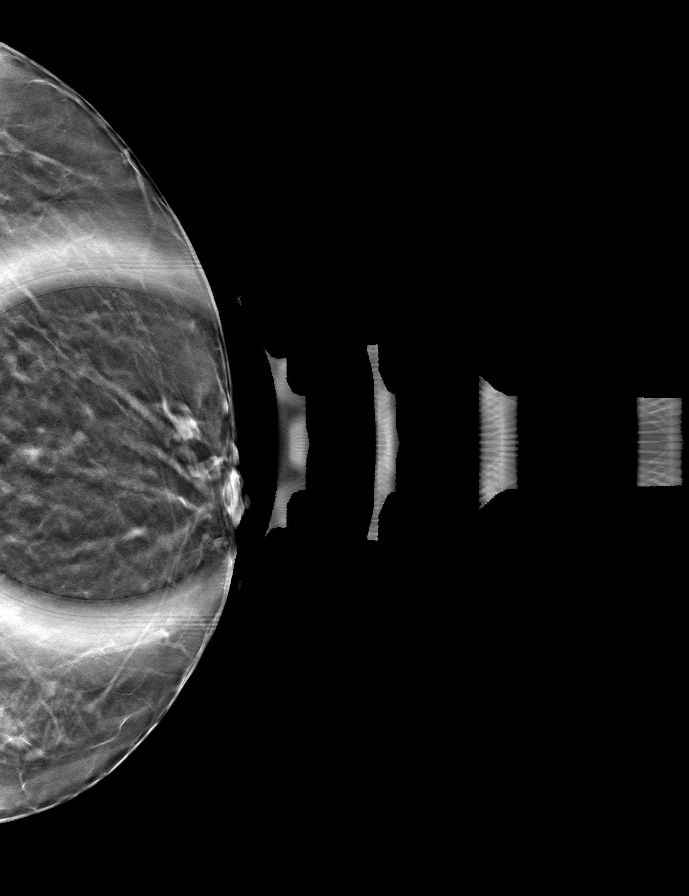

[R CC tomo · tomo slice 30/59.0]
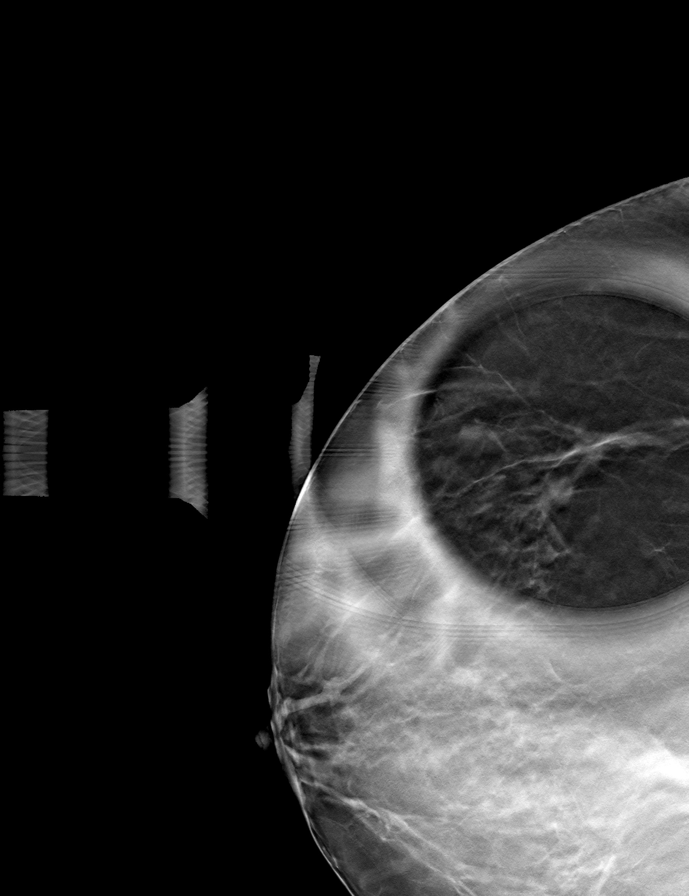

[R ML tomo · tomo slice 41/81.0]
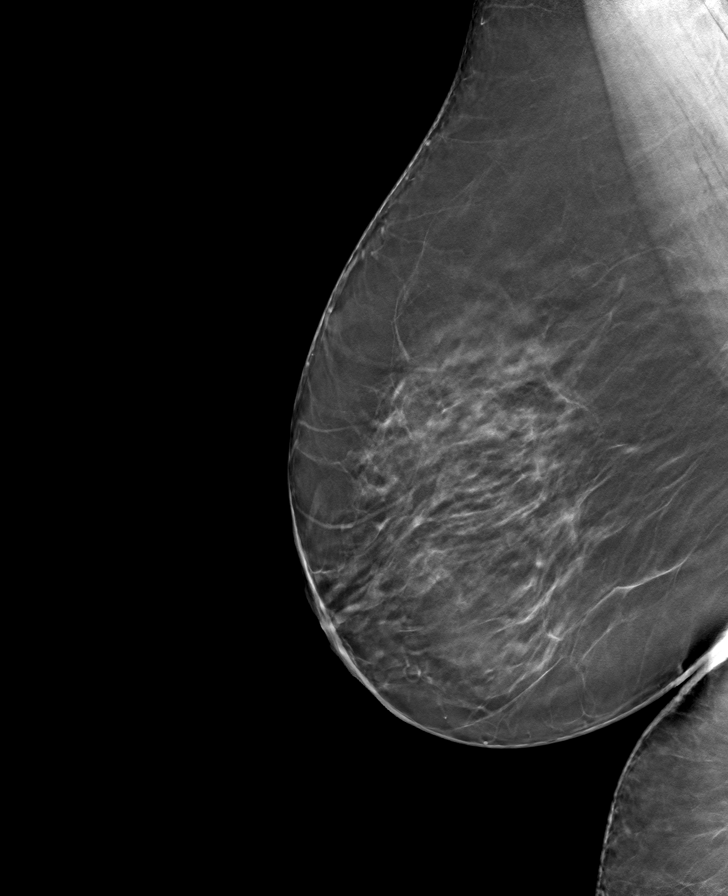

[8 of 24 positions shown; findings below may reference images not displayed]

ACR Breast Density Category c: The breast tissue is heterogeneously
dense, which may obscure small masses.
FINDINGS: Persistent oval circumscribed mass within the outer right breast
middle depth.

Persistent lobular mass within the lower outer left breast
retroareolar location.

Mammographic images were processed with CAD.

Targeted ultrasound is performed, showing a 5 x 3 x 5 mm simple cyst
right breast 9 o'clock position 7 cm from nipple.

Within the left breast retroareolar location 3:30 o'clock there is a
9 x 8 x 9 mm cluster of cysts.
IMPRESSION: Probably benign left breast mass 3:30 o'clock, favored to represent
a cluster of cysts.

Benign right breast cyst.

RECOMMENDATION:
Left breast diagnostic mammogram and ultrasound in 6 months to
ensure stability of probably benign left breast mass, favored to
represent a cluster of cysts.

I have discussed the findings and recommendations with the patient.
If applicable, a reminder letter will be sent to the patient
regarding the next appointment.

BI-RADS CATEGORY  3: Probably benign.

## 2020-07-26 IMAGING — US US BREAST*L* LIMITED INC AXILLA
1 series · 6 of 6 positions shown · non-contrast
Comparison: Previous exam(s).

CLINICAL DATA: Patient recalled from screening for right breast
asymmetry and left breast mass.

EXAM:
DIGITAL DIAGNOSTIC BILATERAL MAMMOGRAM WITH CAD AND TOMO
ULTRASOUND BILATERAL BREAST

[Series 1: us breast*left* limited inc axilla · 0.06mm/px · 6 of 6 slices shown]
[im 1/6]
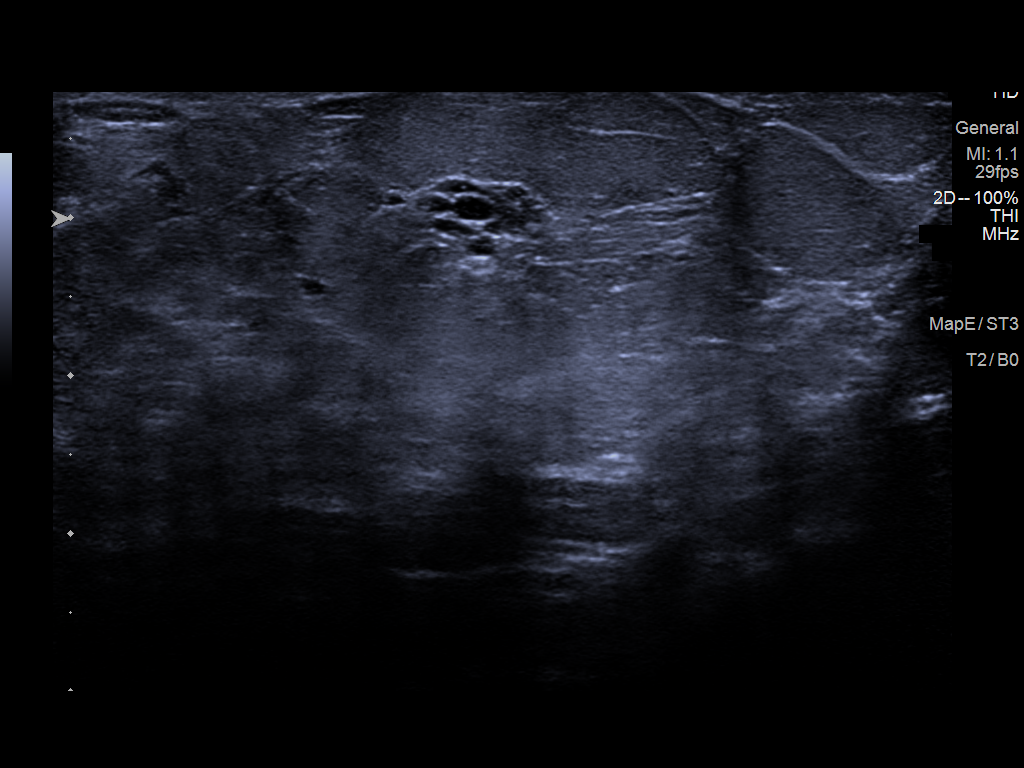
[im 2/6]
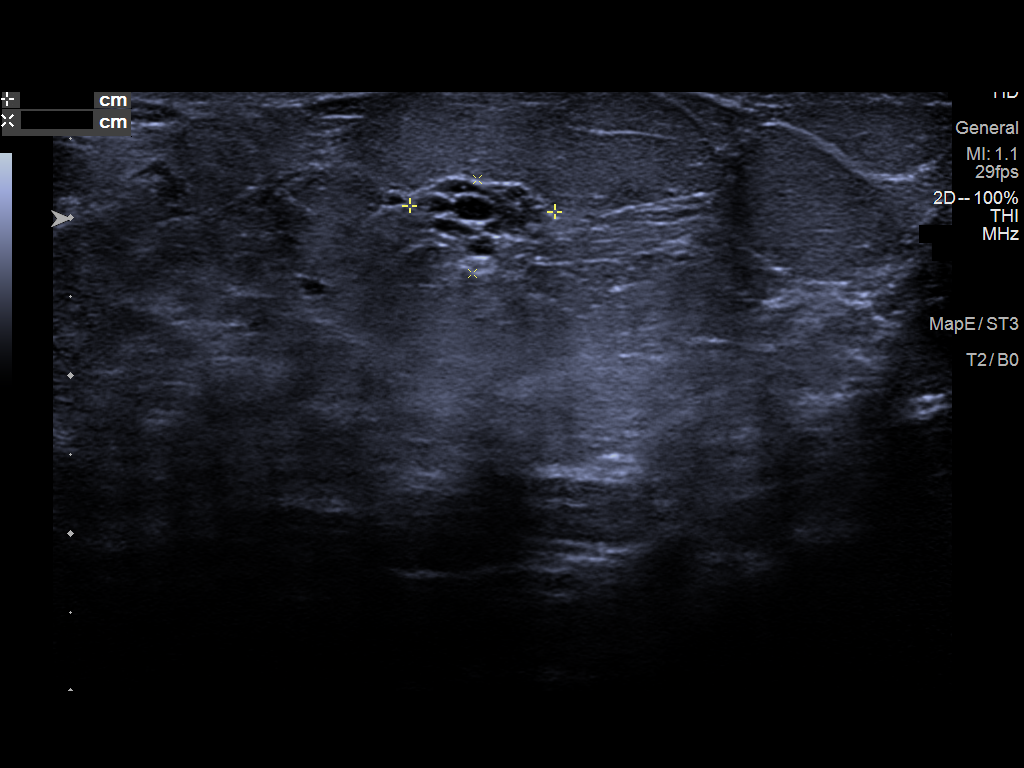
[im 3/6]
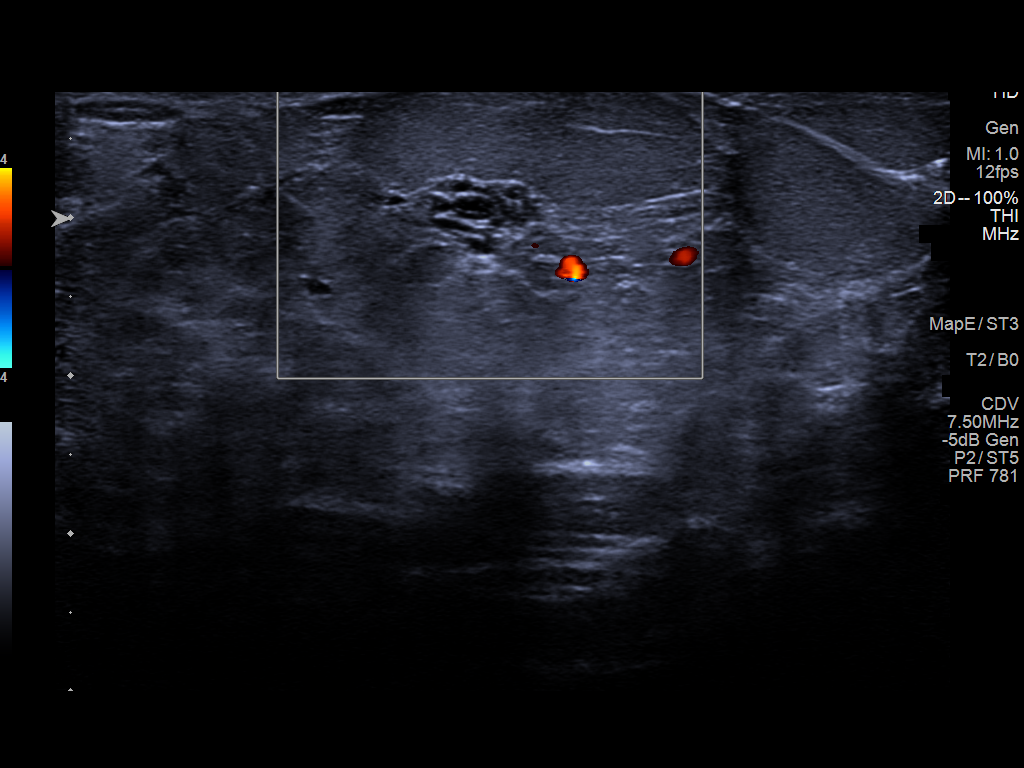
[im 4/6]
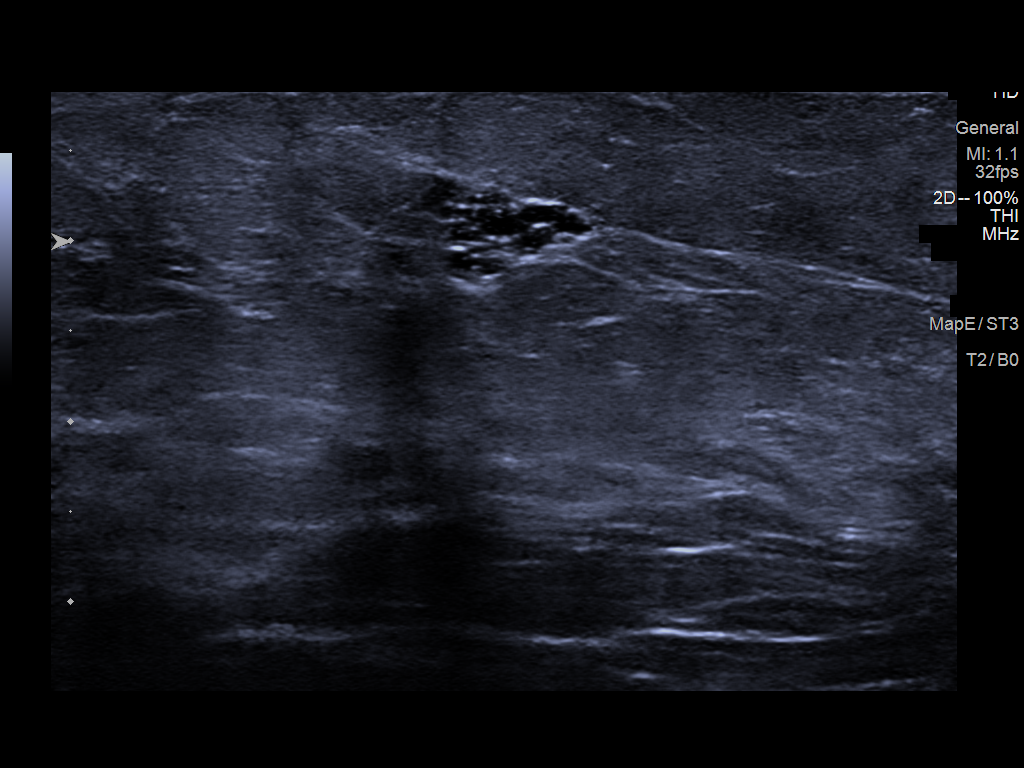
[im 5/6]
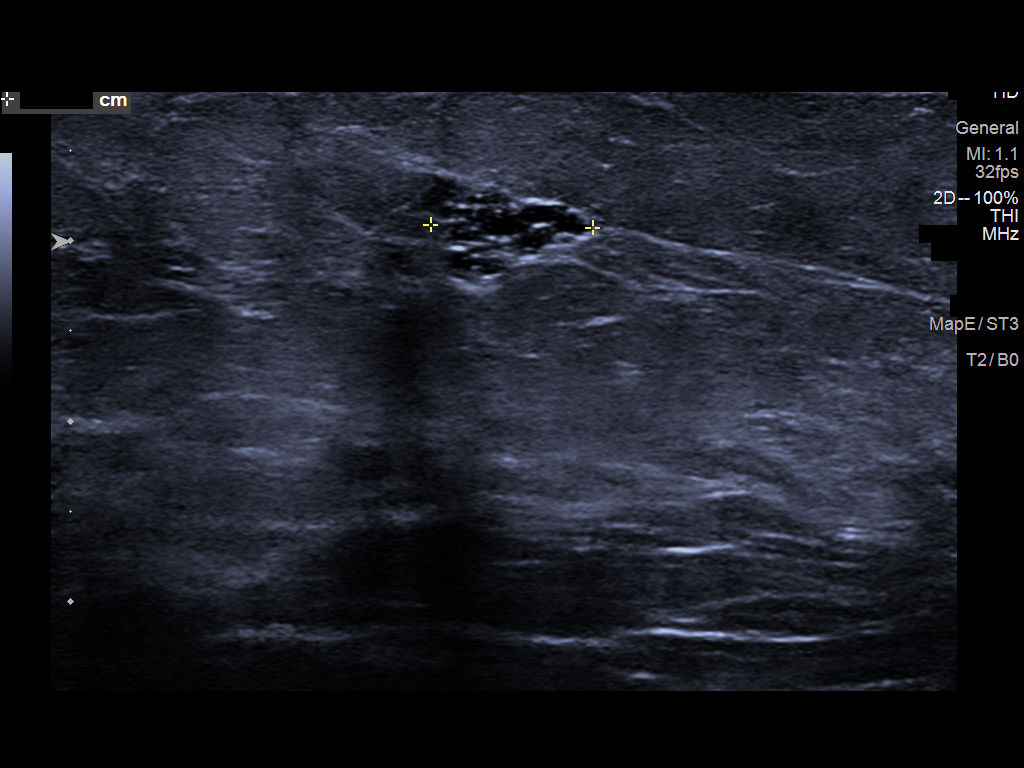
[im 6/6]
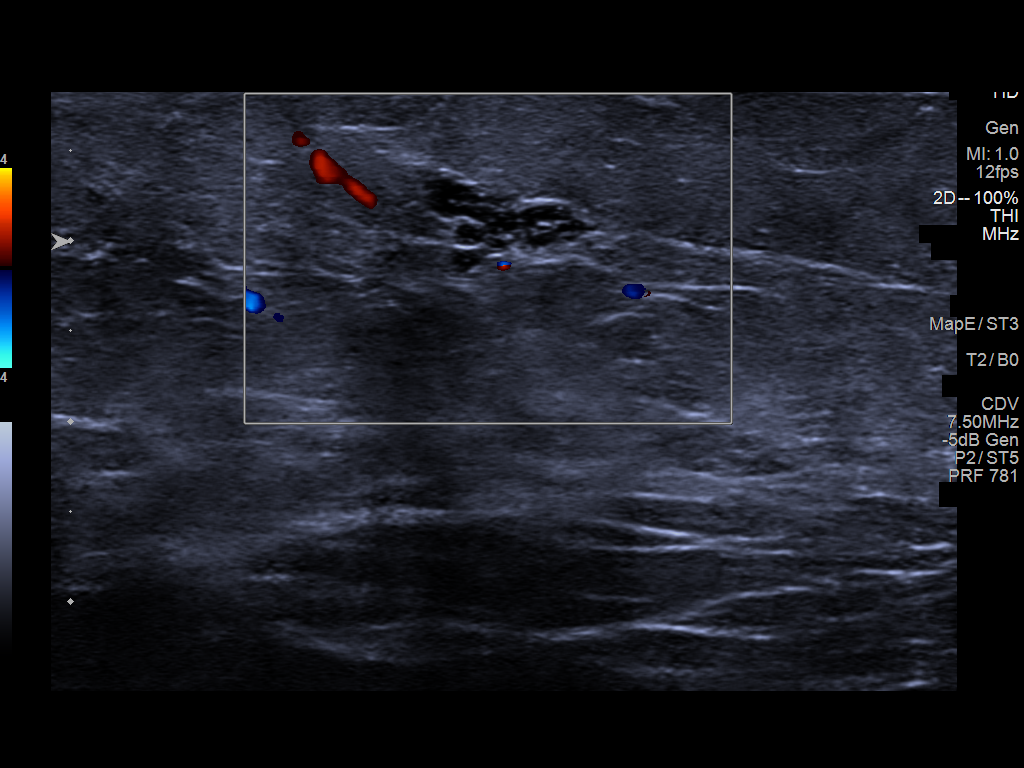

[6 of 6 positions shown; findings below may reference images not displayed]

ACR Breast Density Category c: The breast tissue is heterogeneously
dense, which may obscure small masses.
FINDINGS: Persistent oval circumscribed mass within the outer right breast
middle depth.

Persistent lobular mass within the lower outer left breast
retroareolar location.

Mammographic images were processed with CAD.

Targeted ultrasound is performed, showing a 5 x 3 x 5 mm simple cyst
right breast 9 o'clock position 7 cm from nipple.

Within the left breast retroareolar location 3:30 o'clock there is a
9 x 8 x 9 mm cluster of cysts.
IMPRESSION: Probably benign left breast mass 3:30 o'clock, favored to represent
a cluster of cysts.

Benign right breast cyst.

RECOMMENDATION:
Left breast diagnostic mammogram and ultrasound in 6 months to
ensure stability of probably benign left breast mass, favored to
represent a cluster of cysts.

I have discussed the findings and recommendations with the patient.
If applicable, a reminder letter will be sent to the patient
regarding the next appointment.

BI-RADS CATEGORY  3: Probably benign.

## 2020-07-26 MED ORDER — SODIUM IODIDE I-123 7.4 MBQ CAPS
448.8000 | ORAL_CAPSULE | Freq: Once | ORAL | Status: AC
  Administered 2020-07-26: 448.8 via ORAL

## 2020-07-27 ENCOUNTER — Encounter
Admission: RE | Admit: 2020-07-27 | Discharge: 2020-07-27 | Disposition: A | Discharge status: Home / Self Care | Payer: Federal, State, Local not specified - PPO | Source: Ambulatory Visit | Attending: "Endocrinology | Admitting: "Endocrinology

## 2020-07-27 ENCOUNTER — Other Ambulatory Visit: Payer: Self-pay

## 2020-07-27 DIAGNOSIS — E042 Nontoxic multinodular goiter: Secondary | ICD-10-CM | POA: Diagnosis not present

## 2020-07-27 DIAGNOSIS — E0590 Thyrotoxicosis, unspecified without thyrotoxic crisis or storm: Secondary | ICD-10-CM | POA: Diagnosis not present

## 2020-07-28 NOTE — Addendum Note (Signed)
Addended by: Orland Mustard on: 07/28/2020 04:52 PM   Modules accepted: Orders

## 2020-07-31 NOTE — Telephone Encounter (Signed)
I called pt back vm is full.

## 2020-07-31 NOTE — Telephone Encounter (Signed)
Pt returned your call.  

## 2020-07-31 NOTE — Telephone Encounter (Signed)
Pt was returning the call.

## 2020-08-02 ENCOUNTER — Other Ambulatory Visit: Payer: Self-pay | Admitting: "Endocrinology

## 2020-08-02 ENCOUNTER — Other Ambulatory Visit: Payer: Self-pay | Admitting: Internal Medicine

## 2020-08-02 ENCOUNTER — Encounter: Payer: Self-pay | Admitting: Internal Medicine

## 2020-08-02 DIAGNOSIS — Z1231 Encounter for screening mammogram for malignant neoplasm of breast: Secondary | ICD-10-CM

## 2020-08-02 DIAGNOSIS — E0520 Thyrotoxicosis with toxic multinodular goiter without thyrotoxic crisis or storm: Secondary | ICD-10-CM

## 2020-08-03 ENCOUNTER — Ambulatory Visit: Payer: Federal, State, Local not specified - PPO

## 2020-08-03 ENCOUNTER — Ambulatory Visit
Admission: RE | Admit: 2020-08-03 | Discharge: 2020-08-03 | Disposition: A | Discharge status: Home / Self Care | Payer: Federal, State, Local not specified - PPO | Source: Ambulatory Visit | Attending: Internal Medicine | Admitting: Internal Medicine

## 2020-08-03 ENCOUNTER — Other Ambulatory Visit: Payer: Self-pay

## 2020-08-03 DIAGNOSIS — R1032 Left lower quadrant pain: Secondary | ICD-10-CM | POA: Diagnosis not present

## 2020-08-08 ENCOUNTER — Other Ambulatory Visit: Payer: Self-pay | Admitting: Internal Medicine

## 2020-08-08 DIAGNOSIS — R1032 Left lower quadrant pain: Secondary | ICD-10-CM

## 2020-08-08 NOTE — Progress Notes (Signed)
Order placed for ortho referral.   

## 2020-08-09 ENCOUNTER — Other Ambulatory Visit: Payer: Self-pay

## 2020-08-09 ENCOUNTER — Encounter
Admission: RE | Admit: 2020-08-09 | Discharge: 2020-08-09 | Disposition: A | Discharge status: Home / Self Care | Payer: Federal, State, Local not specified - PPO | Source: Ambulatory Visit | Attending: "Endocrinology | Admitting: "Endocrinology

## 2020-08-09 DIAGNOSIS — E0520 Thyrotoxicosis with toxic multinodular goiter without thyrotoxic crisis or storm: Secondary | ICD-10-CM

## 2020-08-09 MED ORDER — SODIUM IODIDE I 131 CAPSULE
31.0200 | Freq: Once | INTRAVENOUS | Status: AC | PRN
  Administered 2020-08-09: 31.02 via ORAL

## 2020-08-16 ENCOUNTER — Telehealth: Payer: Self-pay | Admitting: Internal Medicine

## 2020-08-16 DIAGNOSIS — Z20822 Contact with and (suspected) exposure to covid-19: Secondary | ICD-10-CM | POA: Diagnosis not present

## 2020-08-16 NOTE — Telephone Encounter (Signed)
Rejection Reason - Patient did not respond - We have contacted this patient 2 times, left 2 messages and mailed a letter. This patient has not contacted [place] back to schedule. Referral is being closed due to time sensitivity but pt has our info to call and schedule. We will still be happy to assist your office and the patient. Thank you!" [facility] [place] said 2 days ago

## 2020-08-21 ENCOUNTER — Encounter: Payer: Self-pay | Admitting: Internal Medicine

## 2020-08-28 ENCOUNTER — Ambulatory Visit
Admission: RE | Admit: 2020-08-28 | Discharge: 2020-08-28 | Disposition: A | Discharge status: Home / Self Care | Payer: Federal, State, Local not specified - PPO | Source: Ambulatory Visit | Attending: Internal Medicine | Admitting: Internal Medicine

## 2020-08-28 ENCOUNTER — Other Ambulatory Visit: Payer: Self-pay

## 2020-08-28 DIAGNOSIS — Z1231 Encounter for screening mammogram for malignant neoplasm of breast: Secondary | ICD-10-CM | POA: Diagnosis not present

## 2020-08-30 DIAGNOSIS — H5213 Myopia, bilateral: Secondary | ICD-10-CM | POA: Diagnosis not present

## 2020-10-02 DIAGNOSIS — R809 Proteinuria, unspecified: Secondary | ICD-10-CM | POA: Diagnosis not present

## 2020-10-02 DIAGNOSIS — N182 Chronic kidney disease, stage 2 (mild): Secondary | ICD-10-CM | POA: Diagnosis not present

## 2020-10-02 DIAGNOSIS — N2581 Secondary hyperparathyroidism of renal origin: Secondary | ICD-10-CM | POA: Diagnosis not present

## 2020-10-02 DIAGNOSIS — I10 Essential (primary) hypertension: Secondary | ICD-10-CM | POA: Diagnosis not present

## 2020-10-04 ENCOUNTER — Telehealth: Payer: Self-pay | Admitting: Internal Medicine

## 2020-10-04 NOTE — Telephone Encounter (Signed)
Kendra Rhodes, Kendra Rhodes  McLean-Scocuzza, Nino Glow, MD Good afternoon!   FYI- I follow back up with pt she states it stopped and to cancel MRI order

## 2020-10-23 DIAGNOSIS — E042 Nontoxic multinodular goiter: Secondary | ICD-10-CM | POA: Diagnosis not present

## 2020-10-23 DIAGNOSIS — E0590 Thyrotoxicosis, unspecified without thyrotoxic crisis or storm: Secondary | ICD-10-CM | POA: Diagnosis not present

## 2020-10-31 ENCOUNTER — Encounter: Payer: Self-pay | Admitting: Internal Medicine

## 2020-10-31 ENCOUNTER — Ambulatory Visit: Payer: Federal, State, Local not specified - PPO | Admitting: Internal Medicine

## 2020-10-31 ENCOUNTER — Other Ambulatory Visit: Payer: Self-pay

## 2020-10-31 VITALS — BP 138/88 | HR 98 | Temp 98.4°F | Ht 66.0 in | Wt 187.2 lb

## 2020-10-31 DIAGNOSIS — G4700 Insomnia, unspecified: Secondary | ICD-10-CM

## 2020-10-31 DIAGNOSIS — J309 Allergic rhinitis, unspecified: Secondary | ICD-10-CM | POA: Diagnosis not present

## 2020-10-31 DIAGNOSIS — N281 Cyst of kidney, acquired: Secondary | ICD-10-CM

## 2020-10-31 DIAGNOSIS — E042 Nontoxic multinodular goiter: Secondary | ICD-10-CM | POA: Diagnosis not present

## 2020-10-31 DIAGNOSIS — R0982 Postnasal drip: Secondary | ICD-10-CM | POA: Diagnosis not present

## 2020-10-31 DIAGNOSIS — I10 Essential (primary) hypertension: Secondary | ICD-10-CM

## 2020-10-31 DIAGNOSIS — R7303 Prediabetes: Secondary | ICD-10-CM

## 2020-10-31 DIAGNOSIS — Z0000 Encounter for general adult medical examination without abnormal findings: Secondary | ICD-10-CM

## 2020-10-31 DIAGNOSIS — N1831 Chronic kidney disease, stage 3a: Secondary | ICD-10-CM

## 2020-10-31 DIAGNOSIS — E669 Obesity, unspecified: Secondary | ICD-10-CM

## 2020-10-31 DIAGNOSIS — K862 Cyst of pancreas: Secondary | ICD-10-CM

## 2020-10-31 DIAGNOSIS — E785 Hyperlipidemia, unspecified: Secondary | ICD-10-CM

## 2020-10-31 MED ORDER — OLMESARTAN MEDOXOMIL-HCTZ 20-12.5 MG PO TABS: 0.5000 | ORAL_TABLET | Freq: Every day | ORAL | 3 refills | Status: DC

## 2020-10-31 MED ORDER — BLOOD PRESSURE KIT: 1.0000 | PACK | Freq: Every day | 0 refills | Status: AC

## 2020-10-31 MED ORDER — AZELASTINE HCL 0.1 % NA SOLN: 2.0000 | Freq: Two times a day (BID) | NASAL | 0 refills | Status: AC

## 2020-10-31 NOTE — Patient Instructions (Addendum)
Goal <130/<80  Stop hctz 12.5 or 25 mg alone and take norvasc 5 mg daily and benicar 20-12.5 1/2 pill daily and if elevated >130/>80 take 1 pill daily each  Buy blood pressure machine  Consider seeing therapy again  Meditation apps for phone calm, headspace, insight timer * Aromatherapy lavaender, eucalyptus   Insomnia Insomnia is a sleep disorder that makes it difficult to fall asleep or stay asleep. Insomnia can cause fatigue, low energy, difficulty concentrating, mood swings, and poor performance at work or school. There are three different ways to classify insomnia:  Difficulty falling asleep.  Difficulty staying asleep.  Waking up too early in the morning. Any type of insomnia can be Rossin-term (chronic) or short-term (acute). Both are common. Short-term insomnia usually lasts for three months or less. Chronic insomnia occurs at least three times a week for longer than three months. What are the causes? Insomnia may be caused by another condition, situation, or substance, such as:  Anxiety.  Certain medicines.  Gastroesophageal reflux disease (GERD) or other gastrointestinal conditions.  Asthma or other breathing conditions.  Restless legs syndrome, sleep apnea, or other sleep disorders.  Chronic pain.  Menopause.  Stroke.  Abuse of alcohol, tobacco, or illegal drugs.  Mental health conditions, such as depression.  Caffeine.  Neurological disorders, such as Alzheimer's disease.  An overactive thyroid (hyperthyroidism). Sometimes, the cause of insomnia may not be known. What increases the risk? Risk factors for insomnia include:  Gender. Women are affected more often than men.  Age. Insomnia is more common as you get older.  Stress.  Lack of exercise.  Irregular work schedule or working night shifts.  Traveling between different time zones.  Certain medical and mental health conditions. What are the signs or symptoms? If you have insomnia, the main  symptom is having trouble falling asleep or having trouble staying asleep. This may lead to other symptoms, such as:  Feeling fatigued or having low energy.  Feeling nervous about going to sleep.  Not feeling rested in the morning.  Having trouble concentrating.  Feeling irritable, anxious, or depressed. How is this diagnosed? This condition may be diagnosed based on:  Your symptoms and medical history. Your health care provider may ask about: ? Your sleep habits. ? Any medical conditions you have. ? Your mental health.  A physical exam. How is this treated? Treatment for insomnia depends on the cause. Treatment may focus on treating an underlying condition that is causing insomnia. Treatment may also include:  Medicines to help you sleep.  Counseling or therapy.  Lifestyle adjustments to help you sleep better. Follow these instructions at home: Eating and drinking   Limit or avoid alcohol, caffeinated beverages, and cigarettes, especially close to bedtime. These can disrupt your sleep.  Do not eat a large meal or eat spicy foods right before bedtime. This can lead to digestive discomfort that can make it hard for you to sleep. Sleep habits   Keep a sleep diary to help you and your health care provider figure out what could be causing your insomnia. Write down: ? When you sleep. ? When you wake up during the night. ? How well you sleep. ? How rested you feel the next day. ? Any side effects of medicines you are taking. ? What you eat and drink.  Make your bedroom a dark, comfortable place where it is easy to fall asleep. ? Put up shades or blackout curtains to block light from outside. ? Use a white noise  machine to block noise. ? Keep the temperature cool.  Limit screen use before bedtime. This includes: ? Watching TV. ? Using your smartphone, tablet, or computer.  Stick to a routine that includes going to bed and waking up at the same times every day and  night. This can help you fall asleep faster. Consider making a quiet activity, such as reading, part of your nighttime routine.  Try to avoid taking naps during the day so that you sleep better at night.  Get out of bed if you are still awake after 15 minutes of trying to sleep. Keep the lights down, but try reading or doing a quiet activity. When you feel sleepy, go back to bed. General instructions  Take over-the-counter and prescription medicines only as told by your health care provider.  Exercise regularly, as told by your health care provider. Avoid exercise starting several hours before bedtime.  Use relaxation techniques to manage stress. Ask your health care provider to suggest some techniques that may work well for you. These may include: ? Breathing exercises. ? Routines to release muscle tension. ? Visualizing peaceful scenes.  Make sure that you drive carefully. Avoid driving if you feel very sleepy.  Keep all follow-up visits as told by your health care provider. This is important. Contact a health care provider if:  You are tired throughout the day.  You have trouble in your daily routine due to sleepiness.  You continue to have sleep problems, or your sleep problems get worse. Get help right away if:  You have serious thoughts about hurting yourself or someone else. If you ever feel like you may hurt yourself or others, or have thoughts about taking your own life, get help right away. You can go to your nearest emergency department or call:  Your local emergency services (911 in the [place]  A suicide crisis helpline, such as the [facility] at 813-499-8955. This is open 24 hours a day. Summary  Insomnia is a sleep disorder that makes it difficult to fall asleep or stay asleep.  Insomnia can be Dampier-term (chronic) or short-term (acute).  Treatment for insomnia depends on the cause. Treatment may focus on treating an underlying  condition that is causing insomnia.  Keep a sleep diary to help you and your health care provider figure out what could be causing your insomnia. This information is not intended to replace advice given to you by your health care provider. Make sure you discuss any questions you have with your health care provider. Document Revised: 11/14/2017 Document Reviewed: 09/11/2017 Elsevier Patient Education  [place].  Hypertension, Adult High blood pressure (hypertension) is when the force of blood pumping through the arteries is too strong. The arteries are the blood vessels that carry blood from the heart throughout the body. Hypertension forces the heart to work harder to pump blood and may cause arteries to become narrow or stiff. Untreated or uncontrolled hypertension can cause a heart attack, heart failure, a stroke, kidney disease, and other problems. A blood pressure reading consists of a higher number over a lower number. Ideally, your blood pressure should be below 120/80. The first ("top") number is called the systolic pressure. It is a measure of the pressure in your arteries as your heart beats. The second ("bottom") number is called the diastolic pressure. It is a measure of the pressure in your arteries as the heart relaxes. What are the causes? The exact cause of this condition is not known.  There are some conditions that result in or are related to high blood pressure. What increases the risk? Some risk factors for high blood pressure are under your control. The following factors may make you more likely to develop this condition:  Smoking.  Having type 2 diabetes mellitus, high cholesterol, or both.  Not getting enough exercise or physical activity.  Being overweight.  Having too much fat, sugar, calories, or salt (sodium) in your diet.  Drinking too much alcohol. Some risk factors for high blood pressure may be difficult or impossible to change. Some of these factors  include:  Having chronic kidney disease.  Having a family history of high blood pressure.  Age. Risk increases with age.  Race. You may be at higher risk if you are African American.  Gender. Men are at higher risk than women before age 26. After age 75, women are at higher risk than men.  Having obstructive sleep apnea.  Stress. What are the signs or symptoms? High blood pressure may not cause symptoms. Very high blood pressure (hypertensive crisis) may cause:  Headache.  Anxiety.  Shortness of breath.  Nosebleed.  Nausea and vomiting.  Vision changes.  Severe chest pain.  Seizures. How is this diagnosed? This condition is diagnosed by measuring your blood pressure while you are seated, with your arm resting on a flat surface, your legs uncrossed, and your feet flat on the floor. The cuff of the blood pressure monitor will be placed directly against the skin of your upper arm at the level of your heart. It should be measured at least twice using the same arm. Certain conditions can cause a difference in blood pressure between your right and left arms. Certain factors can cause blood pressure readings to be lower or higher than normal for a short period of time:  When your blood pressure is higher when you are in a health care provider's office than when you are at home, this is called white coat hypertension. Most people with this condition do not need medicines.  When your blood pressure is higher at home than when you are in a health care provider's office, this is called masked hypertension. Most people with this condition may need medicines to control blood pressure. If you have a high blood pressure reading during one visit or you have normal blood pressure with other risk factors, you may be asked to:  Return on a different day to have your blood pressure checked again.  Monitor your blood pressure at home for 1 week or longer. If you are diagnosed with  hypertension, you may have other blood or imaging tests to help your health care provider understand your overall risk for other conditions. How is this treated? This condition is treated by making healthy lifestyle changes, such as eating healthy foods, exercising more, and reducing your alcohol intake. Your health care provider may prescribe medicine if lifestyle changes are not enough to get your blood pressure under control, and if:  Your systolic blood pressure is above 130.  Your diastolic blood pressure is above 80. Your personal target blood pressure may vary depending on your medical conditions, your age, and other factors. Follow these instructions at home: Eating and drinking   Eat a diet that is high in fiber and potassium, and low in sodium, added sugar, and fat. An example eating plan is called the DASH (Dietary Approaches to Stop Hypertension) diet. To eat this way: ? Eat plenty of fresh fruits and vegetables. Try  to fill one half of your plate at each meal with fruits and vegetables. ? Eat whole grains, such as whole-wheat pasta, brown rice, or whole-grain bread. Fill about one fourth of your plate with whole grains. ? Eat or drink low-fat dairy products, such as skim milk or low-fat yogurt. ? Avoid fatty cuts of meat, processed or cured meats, and poultry with skin. Fill about one fourth of your plate with lean proteins, such as fish, chicken without skin, beans, eggs, or tofu. ? Avoid pre-made and processed foods. These tend to be higher in sodium, added sugar, and fat.  Reduce your daily sodium intake. Most people with hypertension should eat less than 1,500 mg of sodium a day.  Do not drink alcohol if: ? Your health care provider tells you not to drink. ? You are pregnant, may be pregnant, or are planning to become pregnant.  If you drink alcohol: ? Limit how much you use to:  0-1 drink a day for women.  0-2 drinks a day for men. ? Be aware of how much alcohol is in  your drink. In the [place] one drink equals one 12 oz bottle of beer (355 mL), one 5 oz glass of wine (148 mL), or one 1 oz glass of hard liquor (44 mL). Lifestyle   Work with your health care provider to maintain a healthy body weight or to lose weight. Ask what an ideal weight is for you.  Get at least 30 minutes of exercise most days of the week. Activities may include walking, swimming, or biking.  Include exercise to strengthen your muscles (resistance exercise), such as Pilates or lifting weights, as part of your weekly exercise routine. Try to do these types of exercises for 30 minutes at least 3 days a week.  Do not use any products that contain nicotine or tobacco, such as cigarettes, e-cigarettes, and chewing tobacco. If you need help quitting, ask your health care provider.  Monitor your blood pressure at home as told by your health care provider.  Keep all follow-up visits as told by your health care provider. This is important. Medicines  Take over-the-counter and prescription medicines only as told by your health care provider. Follow directions carefully. Blood pressure medicines must be taken as prescribed.  Do not skip doses of blood pressure medicine. Doing this puts you at risk for problems and can make the medicine less effective.  Ask your health care provider about side effects or reactions to medicines that you should watch for. Contact a health care provider if you:  Think you are having a reaction to a medicine you are taking.  Have headaches that keep coming back (recurring).  Feel dizzy.  Have swelling in your ankles.  Have trouble with your vision. Get help right away if you:  Develop a severe headache or confusion.  Have unusual weakness or numbness.  Feel faint.  Have severe pain in your chest or abdomen.  Vomit repeatedly.  Have trouble breathing. Summary  Hypertension is when the force of blood pumping through your arteries is too strong.  If this condition is not controlled, it may put you at risk for serious complications.  Your personal target blood pressure may vary depending on your medical conditions, your age, and other factors. For most people, a normal blood pressure is less than 120/80.  Hypertension is treated with lifestyle changes, medicines, or a combination of both. Lifestyle changes include losing weight, eating a healthy, low-sodium diet, exercising more, and limiting  alcohol. This information is not intended to replace advice given to you by your health care provider. Make sure you discuss any questions you have with your health care provider. Document Revised: 08/12/2018 Document Reviewed: 08/12/2018 Elsevier Patient Education  [place] DASH stands for "Dietary Approaches to Stop Hypertension." The DASH eating plan is a healthy eating plan that has been shown to reduce high blood pressure (hypertension). It may also reduce your risk for type 2 diabetes, heart disease, and stroke. The DASH eating plan may also help with weight loss. What are tips for following this plan?  General guidelines  Avoid eating more than 2,300 mg (milligrams) of salt (sodium) a day. If you have hypertension, you may need to reduce your sodium intake to 1,500 mg a day.  Limit alcohol intake to no more than 1 drink a day for nonpregnant women and 2 drinks a day for men. One drink equals 12 oz of beer, 5 oz of wine, or 1 oz of hard liquor.  Work with your health care provider to maintain a healthy body weight or to lose weight. Ask what an ideal weight is for you.  Get at least 30 minutes of exercise that causes your heart to beat faster (aerobic exercise) most days of the week. Activities may include walking, swimming, or biking.  Work with your health care provider or diet and nutrition specialist (dietitian) to adjust your eating plan to your individual calorie needs. Reading food labels   Check food  labels for the amount of sodium per serving. Choose foods with less than 5 percent of the Daily Value of sodium. Generally, foods with less than 300 mg of sodium per serving fit into this eating plan.  To find whole grains, look for the word "whole" as the first word in the ingredient list. Shopping  Buy products labeled as "low-sodium" or "no salt added."  Buy fresh foods. Avoid canned foods and premade or frozen meals. Cooking  Avoid adding salt when cooking. Use salt-free seasonings or herbs instead of table salt or sea salt. Check with your health care provider or pharmacist before using salt substitutes.  Do not fry foods. Cook foods using healthy methods such as baking, boiling, grilling, and broiling instead.  Cook with heart-healthy oils, such as olive, canola, soybean, or sunflower oil. Meal planning  Eat a balanced diet that includes: ? 5 or more servings of fruits and vegetables each day. At each meal, try to fill half of your plate with fruits and vegetables. ? Up to 6-8 servings of whole grains each day. ? Less than 6 oz of lean meat, poultry, or fish each day. A 3-oz serving of meat is about the same size as a deck of cards. One egg equals 1 oz. ? 2 servings of low-fat dairy each day. ? A serving of nuts, seeds, or beans 5 times each week. ? Heart-healthy fats. Healthy fats called Omega-3 fatty acids are found in foods such as flaxseeds and coldwater fish, like sardines, salmon, and mackerel.  Limit how much you eat of the following: ? Canned or prepackaged foods. ? Food that is high in trans fat, such as fried foods. ? Food that is high in saturated fat, such as fatty meat. ? Sweets, desserts, sugary drinks, and other foods with added sugar. ? Full-fat dairy products.  Do not salt foods before eating.  Try to eat at least 2 vegetarian meals each week.  Eat more home-cooked food and  less restaurant, buffet, and fast food.  When eating at a restaurant, ask that your  food be prepared with less salt or no salt, if possible. What foods are recommended? The items listed may not be a complete list. Talk with your dietitian about what dietary choices are best for you. Grains Whole-grain or whole-wheat bread. Whole-grain or whole-wheat pasta. Brown rice. [place]. Bulgur. Whole-grain and low-sodium cereals. Pita bread. Low-fat, low-sodium crackers. Whole-wheat flour tortillas. Vegetables Fresh or frozen vegetables (raw, steamed, roasted, or grilled). Low-sodium or reduced-sodium tomato and vegetable juice. Low-sodium or reduced-sodium tomato sauce and tomato paste. Low-sodium or reduced-sodium canned vegetables. Fruits All fresh, dried, or frozen fruit. Canned fruit in natural juice (without added sugar). Meat and other protein foods Skinless chicken or [place]. Ground chicken or [place]. Pork with fat trimmed off. Fish and seafood. Egg whites. Dried beans, peas, or lentils. Unsalted nuts, nut butters, and seeds. Unsalted canned beans. Lean cuts of beef with fat trimmed off. Low-sodium, lean deli meat. Dairy Low-fat (1%) or fat-free (skim) milk. Fat-free, low-fat, or reduced-fat cheeses. Nonfat, low-sodium ricotta or cottage cheese. Low-fat or nonfat yogurt. Low-fat, low-sodium cheese. Fats and oils Soft margarine without trans fats. Vegetable oil. Low-fat, reduced-fat, or light mayonnaise and salad dressings (reduced-sodium). Canola, safflower, olive, soybean, and sunflower oils. Avocado. Seasoning and other foods Herbs. Spices. Seasoning mixes without salt. Unsalted popcorn and pretzels. Fat-free sweets. What foods are not recommended? The items listed may not be a complete list. Talk with your dietitian about what dietary choices are best for you. Grains Baked goods made with fat, such as croissants, muffins, or some breads. Dry pasta or rice meal packs. Vegetables Creamed or fried vegetables. Vegetables in a cheese sauce. Regular canned vegetables (not  low-sodium or reduced-sodium). Regular canned tomato sauce and paste (not low-sodium or reduced-sodium). Regular tomato and vegetable juice (not low-sodium or reduced-sodium). Angie Fava. Olives. Fruits Canned fruit in a light or heavy syrup. Fried fruit. Fruit in cream or butter sauce. Meat and other protein foods Fatty cuts of meat. Ribs. Fried meat. Berniece Salines. Sausage. Bologna and other processed lunch meats. Salami. Fatback. Hotdogs. Bratwurst. Salted nuts and seeds. Canned beans with added salt. Canned or smoked fish. Whole eggs or egg yolks. Chicken or [place] with skin. Dairy Whole or 2% milk, cream, and half-and-half. Whole or full-fat cream cheese. Whole-fat or sweetened yogurt. Full-fat cheese. Nondairy creamers. Whipped toppings. Processed cheese and cheese spreads. Fats and oils Butter. Stick margarine. Lard. Shortening. Ghee. Bacon fat. Tropical oils, such as coconut, palm kernel, or palm oil. Seasoning and other foods Salted popcorn and pretzels. Onion salt, garlic salt, seasoned salt, table salt, and sea salt. Worcestershire sauce. Tartar sauce. Barbecue sauce. Teriyaki sauce. Soy sauce, including reduced-sodium. Steak sauce. Canned and packaged gravies. Fish sauce. Oyster sauce. Cocktail sauce. Horseradish that you find on the shelf. Ketchup. Mustard. Meat flavorings and tenderizers. Bouillon cubes. Hot sauce and Tabasco sauce. Premade or packaged marinades. Premade or packaged taco seasonings. Relishes. Regular salad dressings. Where to find more information:  National Heart, Lung, and [place]: https://wilson-eaton.com/  American Heart Association: www.heart.org Summary  The DASH eating plan is a healthy eating plan that has been shown to reduce high blood pressure (hypertension). It may also reduce your risk for type 2 diabetes, heart disease, and stroke.  With the DASH eating plan, you should limit salt (sodium) intake to 2,300 mg a day. If you have hypertension, you may need to reduce  your sodium intake to 1,500 mg a  day.  When on the DASH eating plan, aim to eat more fresh fruits and vegetables, whole grains, lean proteins, low-fat dairy, and heart-healthy fats.  Work with your health care provider or diet and nutrition specialist (dietitian) to adjust your eating plan to your individual calorie needs. This information is not intended to replace advice given to you by your health care provider. Make sure you discuss any questions you have with your health care provider. Document Revised: 11/14/2017 Document Reviewed: 11/25/2016 Elsevier Patient Education  2020 [employer].

## 2020-10-31 NOTE — Progress Notes (Signed)
Chief Complaint  Patient presents with  . Follow-up   Annual  1. HTN sl elevated today on norvasc 5 mg qd and ? If taking hctz Dr. Holley Raring though she was also taking lisinopril 10 mg qd which she is not taking per pt seen renal 10/02/20 Reviewed med list and emphasized compliance with CKD 2/3a  Will change lis hct individually to benicar hct 20-12.5 mg qd 1/2 daily tablet 2. Abnormal ab/pelvis imaging rec CT MRI abdomen will schedule for pt   3. Hyperthyroidism s/p RAI will CC Dr. Ladell Pier to see if he will f/u thyroid nodules with [place] at upcoming visit  TSH lab still low per pt has not heard about results  4. Left Low back/left hip pain resolved per pt since last visit 07/25/20  FINDINGS: AP pelvis and frog-leg lateral view of the left hip. The mineralization and alignment are normal. There is no evidence of acute fracture or dislocation. There is no evidence of femoral head avascular necrosis. The hip and sacroiliac joint spaces are maintained. Grossly stable pelvic calcifications.  IMPRESSION: No acute osseous findings or significant arthropathic changes.   Electronically Signed   By: Richardean Sale M.D.   On: 07/25/2020 17:12 5. C/o PND on singulair and allergies will try nasal spray  6. Stress due to working for post [employer] and driving to [place] due to staffing issues and c/o insomnia   Review of Systems  Constitutional: Negative for weight loss.  HENT:       +PND  Eyes: Negative for blurred vision.  Respiratory: Negative for shortness of breath.   Cardiovascular: Negative for chest pain.  Gastrointestinal: Negative for abdominal pain.  Musculoskeletal: Negative for back pain, falls and joint pain.  Skin: Negative for rash.  Neurological: Negative for headaches.  Psychiatric/Behavioral: The patient has insomnia.    Past Medical History:  Diagnosis Date  . Anemia    iron deficiency and vitamin d deficiency  . Anxiety   . Bladder mass 11/2019  . Breast mass  10/2019   bilateral, ? cysts  . CKD (chronic kidney disease), stage III ([place])   . Depression   . Hypertension   . Insomnia   . Pre-diabetes   . Sickle cell trait ([place])   . Thyroid goiter   . Tubular adenoma of colon   . Wears contact lenses   . Wears dentures    upper and lower partials, also permanent retainer   Past Surgical History:  Procedure Laterality Date  . ABDOMINAL HYSTERECTOMY     for prolapse per pt with mesh ovaries still intact no h/o abnormal pap  . COLONOSCOPY WITH PROPOFOL N/A 06/10/2019   Procedure: COLONOSCOPY WITH PROPOFOL;  Surgeon: Lucilla Lame, MD;  Location: [place];  Service: Endoscopy;  Laterality: N/A;  . CYSTOSCOPY WITH BIOPSY N/A 12/03/2019   Procedure: CYSTOSCOPY WITH bladder BIOPSY;  Surgeon: Billey Co, MD;  Location: [facility];  Service: Urology;  Laterality: N/A;  . CYSTOSCOPY WITH FULGERATION N/A 12/03/2019   Procedure: CYSTOSCOPY WITH FULGERATION;  Surgeon: Billey Co, MD;  Location: [facility];  Service: Urology;  Laterality: N/A;  . ESOPHAGOGASTRODUODENOSCOPY (EGD) WITH PROPOFOL N/A 06/10/2019   Procedure: ESOPHAGOGASTRODUODENOSCOPY (EGD) WITH PROPOFOL;  Surgeon: Lucilla Lame, MD;  Location: [place];  Service: Endoscopy;  Laterality: N/A;  . EYE SURGERY Bilateral    pterygium, both sides repaired  . GIVENS CAPSULE STUDY N/A 08/24/2019   Procedure: GIVENS CAPSULE STUDY;  Surgeon: Lucilla Lame, MD;  Location[facility]  ENDOSCOPY;  Service: Endoscopy;  Laterality: N/A;  . POLYPECTOMY  06/10/2019   Procedure: POLYPECTOMY;  Surgeon: Wohl, Darren, MD;  Location: [facility];  Service: Endoscopy;;   Family History  Problem Relation Age of Onset  . Breast cancer Mother 72  . Diabetes Mother   . Kidney failure Father   . Alcohol abuse Father   . Heart disease Father        chf  . Hyperlipidemia Father   . Other Other        fatty liver cousin  . Sickle cell anemia Other        nephew  . Cancer Other        aunt  with ovarian cancer  . Lung cancer Maternal Aunt   . Autism Grandson    Social History   Socioeconomic History  . Marital status: Divorced    Spouse name: Not on file  . Number of children: Not on file  . Years of education: Not on file  . Highest education level: Not on file  Occupational History  . Not on file  Tobacco Use  . Smoking status: Never Smoker  . Smokeless tobacco: Never Used  Vaping Use  . Vaping Use: Never used  Substance and Sexual Activity  . Alcohol use: Never  . Drug use: Never  . Sexual activity: Yes    Birth control/protection: Surgical    Comment: Hysterectomy  Other Topics Concern  . Not on file  Social History Narrative   Post office employee   2 kids daughter Culeasha Winstead and son Llesuer Thomas   Works homeless shelter weekends    Works [employer] in [place] as of 04/2020   Social Determinants of Health   Financial Resource Strain:   . Difficulty of Paying Living Expenses: Not on file  Food Insecurity:   . Worried About Running Out of Food in the Last Year: Not on file  . Ran Out of Food in the Last Year: Not on file  Transportation Needs:   . Lack of Transportation (Medical): Not on file  . Lack of Transportation (Non-Medical): Not on file  Physical Activity:   . Days of Exercise per Week: Not on file  . Minutes of Exercise per Session: Not on file  Stress:   . Feeling of Stress : Not on file  Social Connections:   . Frequency of Communication with Friends and Family: Not on file  . Frequency of Social Gatherings with Friends and Family: Not on file  . Attends Religious Services: Not on file  . Active Member of Clubs or Organizations: Not on file  . Attends Club or Organization Meetings: Not on file  . Marital Status: Not on file  Intimate Partner Violence:   . Fear of Current or Ex-Partner: Not on file  . Emotionally Abused: Not on file  . Physically Abused: Not on file  . Sexually Abused: Not on file   Current Meds  Medication  Sig  . amLODipine (NORVASC) 5 MG tablet Take 1 tablet (5 mg total) by mouth daily.  . Cholecalciferol (VITAMIN D) 50 MCG (2000 UT) CAPS Take 2,000 Units by mouth every 7 (seven) days.  . citalopram (CELEXA) 10 MG tablet Take 1 tablet (10 mg total) by mouth daily.  . ezetimibe (ZETIA) 10 MG tablet Take 1 tablet (10 mg total) by mouth daily.  . levocetirizine (XYZAL) 5 MG tablet Take 5 mg by mouth every evening.  . montelukast (SINGULAIR) 10 MG tablet Take   10 mg by mouth at bedtime.  . temazepam (RESTORIL) 30 MG capsule Take 1 capsule (30 mg total) by mouth at bedtime as needed for sleep.  Marland Kitchen triamcinolone cream (KENALOG) 0.1 % Apply 1 application topically 2 (two) times daily as needed.  . TRULANCE 3 MG TABS Take 3 mg by mouth daily as needed (constipation).   . [DISCONTINUED] hydrochlorothiazide (HYDRODIURIL) 25 MG tablet Take 0.5 tablets (12.5 mg total) by mouth daily. In am (Patient taking differently: Take 25 mg by mouth daily. )   Allergies  Allergen Reactions  . Sulfa Antibiotics Itching, Swelling and Other (See Comments)  . Wheat Bran Other (See Comments)    bloating  . Belsomra [Suvorexant]     Nightmares   . Melatonin     Night mares    . Ambien [Zolpidem Tartrate] Other (See Comments)    Nightmares   . Aspirin Other (See Comments)    Gastric irritation  . Prednisone Palpitations  . Shellfish Allergy Itching  . Strawberry Extract Itching   No results found for this or any previous visit (from the past 2160 hour(s)). Objective  Body mass index is 30.21 kg/m. Wt Readings from Last 3 Encounters:  10/31/20 187 lb 3.2 oz (84.9 kg)  07/25/20 186 lb 12.8 oz (84.7 kg)  05/02/20 188 lb (85.3 kg)   Temp Readings from Last 3 Encounters:  10/31/20 98.4 F (36.9 C) (Oral)  07/25/20 98.6 F (37 C) (Oral)  05/02/20 (!) 97.4 F (36.3 C) (Temporal)   BP Readings from Last 3 Encounters:  10/31/20 138/88  07/25/20 126/78  05/02/20 126/76   Pulse Readings from Last 3  Encounters:  10/31/20 98  07/25/20 (!) 101  05/02/20 89    Physical Exam Vitals and nursing note reviewed.  Constitutional:      Appearance: Normal appearance. She is well-developed and well-groomed. She is obese.  HENT:     Head: Normocephalic and atraumatic.  Eyes:     Conjunctiva/sclera: Conjunctivae normal.     Pupils: Pupils are equal, round, and reactive to light.  Cardiovascular:     Rate and Rhythm: Normal rate and regular rhythm.     Heart sounds: Normal heart sounds. No murmur heard.   Pulmonary:     Effort: Pulmonary effort is normal.     Breath sounds: Normal breath sounds.  Skin:    General: Skin is warm and moist.  Neurological:     General: No focal deficit present.     Mental Status: She is alert and oriented to person, place, and time. Mental status is at baseline.     Gait: Gait normal.  Psychiatric:        Attention and Perception: Attention and perception normal.        Mood and Affect: Mood and affect normal.        Speech: Speech normal.        Behavior: Behavior normal. Behavior is cooperative.        Thought Content: Thought content normal.        Cognition and Memory: Cognition and memory normal.        Judgment: Judgment normal.     Assessment  Plan  Annual physical exam Will need lipid, A1C  in the future ordered [facility]   Labs cmet 10/02/20 gluc 147, Cr 1.20 and GFR 56 h/h 11.1/33.8 (no iron def as of 05/29/20), K 3.4, TSH 10/23/20 <0.010 Flu utd 2021 covid 26 3/3 pfizer  Requested records of vaccines from prior PCP  Consider Tdap and shingrix Hep C negative    mammo h/o right breast cyst benign and cluster of cysts left breast  --> mammo due 08/28/20 negative   S/p hysterectomy no h/o abnormal pap, ovaries intact consider pelvic in futuredoing pelvic MRI see above  Dexa consider age 57   colonoscopy Dr. Allen Norris 06/10/19/egd chronic gastritis, tubular adenoma f/u in 5 years  rec healthy diet and exercise  Consider CT/MRI  abdomen pancreatic cyst uncinate 1.9 from 1.4 cm likely benign to ensure stability 11/21/20 CT 11/22/19 hematuria w/o  IMPRESSION: 1. No focal bladder wall mass lesion. Patient is noted to have fairly marked pelvic floor laxity with associated cystocele. The posterior bladder is draped across the apparent urethral origin creating a "masslike bulge" on the inferior bladder, likely accounting for the appearance on ultrasound. As urethral and pelvic floor soft tissues are not well evaluated by CT, pelvic MRI without and with contrast could be used to better assess urethral and pelvic floor anatomy and exclude underlying urethral/pelvic floor mass lesion, as warranted. 2. 19 mm cyst in the uncinate process of the pancreas has a multicystic configuration is increased only minimally in size since 09/04/2013, suggesting benign etiology. Given the slight progression, repeat CT or MRI using pancreatic protocol in 12 months may be warranted to ensure stability. 3. Bilateral renal cysts with tiny low-density lesions in the left kidney, too small to characterize. -->11/21/20 ordered MRI abdomen w and w/o f/u above   -->Dr. Honor Junes 10/05/19 US thyroid MNG and nodules and hyperthyroidism -->appt to f/u 02/03/20 Dr. Sharrie Rothman endocrine f/u4/20/21 neg bx -CC Dr. Ladell Pier as of 10/31/20 ? If he is going to order thyroid US repeat 2021 and TSH 10/23/20 <0.010 s/p RAI pt states did not hear re what to do next with results she c/o fatigue and reduced sleep   IMPRESSION: Enlarging TI-RADS category 3 nodule occupying the left mid and inferior gland now measuring up to 5.9 cm compared to 5.1 cm previously. This lesion continues to meet criteria for consideration of fine-needle aspiration biopsy. -see above) f/u 07/10/20 [facility] endocrine  rec healthy diet and exercise   Essential hypertension with CKD 2/3A hx BP not controlled pt not taking lisinopril 10 mg qd ? If taking hctz 12.5 or 25 and on norvasc 5  mg qd- Plan: olmesartan-hydrochlorothiazide (BENICAR HCT) 20-12.5 MG tablet 1/2 pill in the am + norvasc 5 mg qd stop other meds taking hctz alone not taking lis 10 mg qd alone as note indicated 09/2020 renal, Blood Pressure KIT -->CC Dr. Holley Raring    PND (post-nasal drip) - Plan: azelastine (ASTELIN) 0.1 % nasal spray On singulair as well   Allergic rhinitis, unspecified seasonality, unspecified trigger - Plan: azelastine (ASTELIN) 0.1 % nasal spray  Obesity (BMI 30-39.9)  rec healthy diet and exercise   Insomnia  -consider seeing therapy again  Med apps discussed  Meditation apps for phone calm, headspace, insight timer * Aromatherapy lavaender, eucalyptus  Specialists:  Endocrine [facility] endocrine Dr. Honor Junes renal Dr. Holley Raring Urology Dr. Diamantina [place] Provider: Dr. Olivia Mackie McLean-Scocuzza-Internal Medicine

## 2020-11-07 DIAGNOSIS — R7303 Prediabetes: Secondary | ICD-10-CM | POA: Insufficient documentation

## 2020-11-21 ENCOUNTER — Other Ambulatory Visit: Payer: Self-pay

## 2020-11-21 ENCOUNTER — Ambulatory Visit
Admission: RE | Admit: 2020-11-21 | Discharge: 2020-11-21 | Disposition: A | Discharge status: Home / Self Care | Payer: Federal, State, Local not specified - PPO | Source: Ambulatory Visit | Attending: Internal Medicine | Admitting: Internal Medicine

## 2020-11-21 DIAGNOSIS — K8689 Other specified diseases of pancreas: Secondary | ICD-10-CM | POA: Diagnosis not present

## 2020-11-21 DIAGNOSIS — N1831 Chronic kidney disease, stage 3a: Secondary | ICD-10-CM

## 2020-11-21 DIAGNOSIS — N281 Cyst of kidney, acquired: Secondary | ICD-10-CM | POA: Diagnosis not present

## 2020-11-21 DIAGNOSIS — K449 Diaphragmatic hernia without obstruction or gangrene: Secondary | ICD-10-CM | POA: Diagnosis not present

## 2020-11-21 DIAGNOSIS — K862 Cyst of pancreas: Secondary | ICD-10-CM

## 2020-11-21 DIAGNOSIS — K8020 Calculus of gallbladder without cholecystitis without obstruction: Secondary | ICD-10-CM | POA: Diagnosis not present

## 2020-11-21 MED ORDER — GADOBUTROL 1 MMOL/ML IV SOLN
7.5000 mL | Freq: Once | INTRAVENOUS | Status: AC | PRN
  Administered 2020-11-21: 7.5 mL via INTRAVENOUS

## 2020-11-22 ENCOUNTER — Telehealth (INDEPENDENT_AMBULATORY_CARE_PROVIDER_SITE_OTHER): Payer: Federal, State, Local not specified - PPO | Admitting: Internal Medicine

## 2020-11-22 ENCOUNTER — Other Ambulatory Visit: Payer: Self-pay

## 2020-11-22 ENCOUNTER — Encounter: Payer: Self-pay | Admitting: Internal Medicine

## 2020-11-22 VITALS — BP 127/87 | Ht 66.0 in | Wt 186.0 lb

## 2020-11-22 DIAGNOSIS — Z20822 Contact with and (suspected) exposure to covid-19: Secondary | ICD-10-CM | POA: Diagnosis not present

## 2020-11-22 DIAGNOSIS — J0110 Acute frontal sinusitis, unspecified: Secondary | ICD-10-CM | POA: Diagnosis not present

## 2020-11-22 MED ORDER — FLUTICASONE PROPIONATE 50 MCG/ACT NA SUSP: 2.0000 | Freq: Every day | NASAL | 6 refills | Status: DC

## 2020-11-22 MED ORDER — AZITHROMYCIN 250 MG PO TABS: ORAL_TABLET | ORAL | 0 refills | Status: DC

## 2020-11-22 MED ORDER — SALINE SPRAY 0.65 % NA SOLN: 2.0000 | NASAL | 0 refills | Status: DC | PRN

## 2020-11-22 NOTE — Progress Notes (Signed)
Telephone Note  I connected with Veronia Laprise  on 11/22/20 at 10:30 AM EST by telephone and verified that I am speaking with the correct person using two identifiers.  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 The patient expressed understanding and agreed to proceed.   HPI: Last Monday had covid test negative prior to going to DR last week temp there was 68 F and had PND and sinus frontal pressure tried Tylenol, benadryl, xyzal otc and has tried nose sprays and helped in the past    ROS: See pertinent positives and negatives per HPI.  Past Medical History:  Diagnosis Date  . Anemia    iron deficiency and vitamin d deficiency  . Anxiety   . Bladder mass 11/2019  . Breast mass 10/2019   bilateral, ? cysts  . CKD (chronic kidney disease), stage III ([place])   . Depression   . Hypertension   . Insomnia   . Pre-diabetes   . Sickle cell trait ([place])   . Thyroid goiter   . Tubular adenoma of colon   . Wears contact lenses   . Wears dentures    upper and lower partials, also permanent retainer    Past Surgical History:  Procedure Laterality Date  . ABDOMINAL HYSTERECTOMY     for prolapse per pt with mesh ovaries still intact no h/o abnormal pap  . COLONOSCOPY WITH PROPOFOL N/A 06/10/2019   Procedure: COLONOSCOPY WITH PROPOFOL;  Surgeon: Lucilla Lame, MD;  Location: [place];  Service: Endoscopy;  Laterality: N/A;  . CYSTOSCOPY WITH BIOPSY N/A 12/03/2019   Procedure: CYSTOSCOPY WITH bladder BIOPSY;  Surgeon: Billey Co, MD;  Location: [facility];  Service: Urology;  Laterality: N/A;  . CYSTOSCOPY WITH FULGERATION N/A 12/03/2019   Procedure: CYSTOSCOPY WITH FULGERATION;  Surgeon: Billey Co, MD;  Location: [facility];  Service: Urology;  Laterality: N/A;  .  ESOPHAGOGASTRODUODENOSCOPY (EGD) WITH PROPOFOL N/A 06/10/2019   Procedure: ESOPHAGOGASTRODUODENOSCOPY (EGD) WITH PROPOFOL;  Surgeon: Lucilla Lame, MD;  Location: [place];  Service: Endoscopy;  Laterality: N/A;  . EYE SURGERY Bilateral    pterygium, both sides repaired  . GIVENS CAPSULE STUDY N/A 08/24/2019   Procedure: GIVENS CAPSULE STUDY;  Surgeon: Lucilla Lame, MD;  Location: [facility];  Service: Endoscopy;  Laterality: N/A;  . POLYPECTOMY  06/10/2019   Procedure: POLYPECTOMY;  Surgeon: Lucilla Lame, MD;  Location: [facility];  Service: Endoscopy;;     Current Outpatient Medications:  .  azelastine (ASTELIN) 0.1 % nasal spray, Place 2 sprays into both nostrils 2 (two) times daily. Use in each nostril as directed prn, Disp: 30 mL, Rfl: 0 .  Blood Pressure KIT, 1 Device by Does not apply route daily. BP cuff adult large size, Disp: 1 kit, Rfl: 0 .  Cholecalciferol (VITAMIN D) 50 MCG (2000 UT) CAPS, Take 2,000 Units by mouth every 7 (seven) days., Disp: , Rfl:  .  ezetimibe (ZETIA) 10 MG tablet, Take 1 tablet (10 mg total) by mouth daily., Disp: 90 tablet, Rfl: 3 .  levocetirizine (XYZAL) 5 MG tablet, Take 5 mg by mouth every evening., Disp: , Rfl:  .  montelukast (SINGULAIR) 10 MG tablet, Take 10 mg by mouth at bedtime., Disp: , Rfl:  .  olmesartan-hydrochlorothiazide (BENICAR HCT) 20-12.5 MG tablet, Take 0.5 tablets by mouth daily. In the  am, Disp: 45 tablet, Rfl: 3 .  temazepam (RESTORIL) 30 MG capsule, Take 1 capsule (30 mg total) by mouth at bedtime as needed for sleep., Disp: 30 capsule, Rfl: 5 .  amLODipine (NORVASC) 5 MG tablet, Take 1 tablet (5 mg total) by mouth daily. (Patient not taking: Reported on 11/22/2020), Disp: 90 tablet, Rfl: 3 .  azithromycin (ZITHROMAX) 250 MG tablet, 2 pills day 1 and 1 pill day 2-5 with food, Disp: 6 tablet, Rfl: 0 .  citalopram (CELEXA) 10 MG tablet, Take 1 tablet (10 mg total) by mouth daily. (Patient not taking: Reported on  11/22/2020), Disp: 90 tablet, Rfl: 3 .  fluticasone (FLONASE) 50 MCG/ACT nasal spray, Place 2 sprays into both nostrils daily., Disp: 16 g, Rfl: 6 .  sodium chloride (OCEAN) 0.65 % SOLN nasal spray, Place 2 sprays into both nostrils as needed for congestion., Disp: 30 mL, Rfl: 0 .  triamcinolone cream (KENALOG) 0.1 %, Apply 1 application topically 2 (two) times daily as needed. (Patient not taking: Reported on 11/22/2020), Disp: 454 g, Rfl: 2 .  TRULANCE 3 MG TABS, Take 3 mg by mouth daily as needed (constipation).  (Patient not taking: Reported on 11/22/2020), Disp: , Rfl:   EXAM:  VITALS per patient if applicable:  GENERAL: alert, oriented, appears well and in no acute distress  PSYCH/NEURO: pleasant and cooperative, no obvious depression or anxiety, speech and thought processing grossly intact  ASSESSMENT AND PLAN:  Discussed the following assessment and plan:  Acute frontal sinusitis, recurrence not specified - Plan: azithromycin (ZITHROMAX) 250 MG tablet, sodium chloride (OCEAN) 0.65 % SOLN nasal spray, fluticasone (FLONASE) 50 MCG/ACT nasal spray sch covid test [facility] [place]  sch today at 12 pm   -we discussed possible serious and likely etiologies, options for evaluation and workup, limitations of telemedicine visit vs in person visit, treatment, treatment risks and precautions.     I discussed the assessment and treatment plan with the patient. The patient was provided an opportunity to ask questions and all were answered. The patient agreed with the plan and demonstrated an understanding of the instructions.    Time spent 20 min Delorise Jackson, MD

## 2020-11-22 NOTE — Progress Notes (Signed)
Onset of symptoms 2 weeks ago, headache, drainage down the throat, pressure, congestion.

## 2020-11-27 ENCOUNTER — Telehealth: Payer: Self-pay

## 2020-11-27 ENCOUNTER — Encounter: Payer: Self-pay | Admitting: Internal Medicine

## 2020-11-27 DIAGNOSIS — K449 Diaphragmatic hernia without obstruction or gangrene: Secondary | ICD-10-CM | POA: Insufficient documentation

## 2020-11-27 DIAGNOSIS — I517 Cardiomegaly: Secondary | ICD-10-CM | POA: Insufficient documentation

## 2020-11-27 DIAGNOSIS — K7689 Other specified diseases of liver: Secondary | ICD-10-CM | POA: Insufficient documentation

## 2020-11-27 DIAGNOSIS — I700 Atherosclerosis of aorta: Secondary | ICD-10-CM | POA: Insufficient documentation

## 2020-11-27 NOTE — Telephone Encounter (Signed)
Pt notified of MRI results via mychart.

## 2020-11-27 NOTE — Telephone Encounter (Signed)
-----   Message from Lucilla Lame, MD sent at 11/27/2020 12:01 PM EST ----- Let the patient know that the MRI did not show anything worrisome except the previously seen lesions in the pancreas and the cysts in the kidneys.  They recommended by guidelines that she have a repeat MRI in one year.  Please put her on the callback list for a repeat MRI in one year

## 2020-11-28 ENCOUNTER — Telehealth: Payer: Self-pay | Admitting: Internal Medicine

## 2020-11-28 ENCOUNTER — Other Ambulatory Visit: Payer: Self-pay | Admitting: Internal Medicine

## 2020-11-28 DIAGNOSIS — J321 Chronic frontal sinusitis: Secondary | ICD-10-CM

## 2020-11-28 MED ORDER — AMOXICILLIN-POT CLAVULANATE 875-125 MG PO TABS: 1.0000 | ORAL_TABLET | Freq: Two times a day (BID) | ORAL | 0 refills | Status: DC

## 2020-11-28 NOTE — Telephone Encounter (Signed)
Please advise 

## 2020-11-28 NOTE — Telephone Encounter (Signed)
Patient called in stated that medication that was prescribed for sinus is not doing any good still have the headaches and she got the covid test came back negative

## 2020-11-28 NOTE — Telephone Encounter (Signed)
Sent augmentin to [facility] in [place]  What is her BP?

## 2020-11-29 ENCOUNTER — Other Ambulatory Visit: Payer: Self-pay | Admitting: Internal Medicine

## 2020-11-29 DIAGNOSIS — I10 Essential (primary) hypertension: Secondary | ICD-10-CM

## 2020-11-29 MED ORDER — AMLODIPINE BESYLATE 5 MG PO TABS: 5.0000 mg | ORAL_TABLET | Freq: Every day | ORAL | 3 refills | Status: DC

## 2020-11-29 NOTE — Telephone Encounter (Signed)
Pt should be on benicar hctz and norvasc sent this in  Call back next week on in 3 days with BP and HR reading

## 2020-11-29 NOTE — Telephone Encounter (Signed)
Patient informed and verbalized understanding.  States that her blood pressure has been high since changing medication. Has been running around 136/93.  She is currently taking Zetia and Olmesartan combination pill. She does not have the amlodipine 5 mg.

## 2020-11-29 NOTE — Telephone Encounter (Signed)
Left message to return call also for lab results

## 2020-11-29 NOTE — Telephone Encounter (Signed)
Left message to return call 

## 2020-11-29 NOTE — Telephone Encounter (Signed)
Patient calling back in. BP is 144/90 and heart rate has been 100 and above.

## 2020-11-30 NOTE — Telephone Encounter (Signed)
Left message to return call 

## 2020-11-30 NOTE — Telephone Encounter (Signed)
Patient informed and verbalized understanding.  She will start the Amlodipine along with the Olmesartan combination pill

## 2020-11-30 NOTE — Telephone Encounter (Signed)
Pt returned your call.  

## 2020-12-21 DIAGNOSIS — Z20822 Contact with and (suspected) exposure to covid-19: Secondary | ICD-10-CM | POA: Diagnosis not present

## 2020-12-30 DIAGNOSIS — Z20822 Contact with and (suspected) exposure to covid-19: Secondary | ICD-10-CM | POA: Diagnosis not present

## 2021-01-05 ENCOUNTER — Telehealth: Payer: Federal, State, Local not specified - PPO | Admitting: Internal Medicine

## 2021-01-09 ENCOUNTER — Telehealth: Payer: Federal, State, Local not specified - PPO | Admitting: Internal Medicine

## 2021-01-22 DIAGNOSIS — E0590 Thyrotoxicosis, unspecified without thyrotoxic crisis or storm: Secondary | ICD-10-CM | POA: Diagnosis not present

## 2021-01-22 DIAGNOSIS — E042 Nontoxic multinodular goiter: Secondary | ICD-10-CM | POA: Diagnosis not present

## 2021-01-31 ENCOUNTER — Encounter: Payer: Self-pay | Admitting: Internal Medicine

## 2021-01-31 ENCOUNTER — Telehealth (INDEPENDENT_AMBULATORY_CARE_PROVIDER_SITE_OTHER): Payer: Federal, State, Local not specified - PPO | Admitting: Internal Medicine

## 2021-01-31 ENCOUNTER — Other Ambulatory Visit: Payer: Self-pay

## 2021-01-31 VITALS — BP 132/83 | Ht 66.0 in | Wt 180.0 lb

## 2021-01-31 DIAGNOSIS — E785 Hyperlipidemia, unspecified: Secondary | ICD-10-CM

## 2021-01-31 DIAGNOSIS — R0683 Snoring: Secondary | ICD-10-CM | POA: Diagnosis not present

## 2021-01-31 DIAGNOSIS — F5104 Psychophysiologic insomnia: Secondary | ICD-10-CM

## 2021-01-31 DIAGNOSIS — E042 Nontoxic multinodular goiter: Secondary | ICD-10-CM

## 2021-01-31 DIAGNOSIS — I10 Essential (primary) hypertension: Secondary | ICD-10-CM | POA: Diagnosis not present

## 2021-01-31 DIAGNOSIS — R7303 Prediabetes: Secondary | ICD-10-CM | POA: Diagnosis not present

## 2021-01-31 NOTE — Progress Notes (Signed)
Telephone Note  I connected with Kendra Rhodes  on 01/31/21 at  2:00 PM EST by telephone and verified that I am speaking with the correct person using two identifiers.  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 The patient expressed understanding and agreed to proceed.   HPI: 1. Snoring x 5 years and waking herself up at night 2. htn controlled on norvasc 5 mg qd and benicar 20-12.5 mg qd BP improved was 118/82  3. MNG bx benign and s/p RAI tx seen 01/22/21 Dr. Honor Junes and will f/u 05/25/21 TSH <0.010, Ft3 4.1 Renal appt 02/01/21  4. covid 19 +01/03/21 tired and fatigue and reduced po feeling better had 3/3 covid vaccines 5. HLD on zetia 10 mg qd need to check cholesterol in the am  6. Insomnia, chronic restoril 30 mg qhs last night had 7 hrs of sleep from 10 pm to 5 am   -COVID-19 vaccine status: 3/3  ROS: See pertinent positives and negatives per HPI.  Past Medical History:  Diagnosis Date  . Anemia    iron deficiency and vitamin d deficiency  . Anxiety   . Bladder mass 11/2019  . Breast mass 10/2019   bilateral, ? cysts  . CKD (chronic kidney disease), stage III ([place])   . COVID-19    01/03/21  . Depression   . Hypertension   . Insomnia   . Pre-diabetes   . Sickle cell trait ([place])   . Thyroid goiter   . Tubular adenoma of colon   . Wears contact lenses   . Wears dentures    upper and lower partials, also permanent retainer    Past Surgical History:  Procedure Laterality Date  . ABDOMINAL HYSTERECTOMY     for prolapse per pt with mesh ovaries still intact no h/o abnormal pap  . COLONOSCOPY WITH PROPOFOL N/A 06/10/2019   Procedure: COLONOSCOPY WITH PROPOFOL;  Surgeon: Lucilla Lame, MD;  Location: [place];  Service: Endoscopy;  Laterality: N/A;  . CYSTOSCOPY  WITH BIOPSY N/A 12/03/2019   Procedure: CYSTOSCOPY WITH bladder BIOPSY;  Surgeon: Billey Co, MD;  Location: [facility];  Service: Urology;  Laterality: N/A;  . CYSTOSCOPY WITH FULGERATION N/A 12/03/2019   Procedure: CYSTOSCOPY WITH FULGERATION;  Surgeon: Billey Co, MD;  Location: [facility];  Service: Urology;  Laterality: N/A;  . ESOPHAGOGASTRODUODENOSCOPY (EGD) WITH PROPOFOL N/A 06/10/2019   Procedure: ESOPHAGOGASTRODUODENOSCOPY (EGD) WITH PROPOFOL;  Surgeon: Lucilla Lame, MD;  Location: [place];  Service: Endoscopy;  Laterality: N/A;  . EYE SURGERY Bilateral    pterygium, both sides repaired  . GIVENS CAPSULE STUDY N/A 08/24/2019   Procedure: GIVENS CAPSULE STUDY;  Surgeon: Lucilla Lame, MD;  Location: [facility];  Service: Endoscopy;  Laterality: N/A;  . POLYPECTOMY  06/10/2019   Procedure: POLYPECTOMY;  Surgeon: Lucilla Lame, MD;  Location: [facility];  Service: Endoscopy;;     Current Outpatient Medications:  .  amLODipine (NORVASC) 5 MG tablet, Take 1 tablet (5 mg total) by mouth daily., Disp: 90 tablet, Rfl: 3 .  azelastine (ASTELIN) 0.1 % nasal spray, Place 2 sprays into both nostrils 2 (two) times daily. Use in each nostril as directed prn, Disp: 30 mL, Rfl: 0 .  Blood Pressure KIT, 1 Device by Does not apply route daily. BP cuff adult large size, Disp: 1 kit, Rfl:  0 .  Cholecalciferol (VITAMIN D) 50 MCG (2000 UT) CAPS, Take 2,000 Units by mouth every 7 (seven) days., Disp: , Rfl:  .  ezetimibe (ZETIA) 10 MG tablet, Take 1 tablet (10 mg total) by mouth daily., Disp: 90 tablet, Rfl: 3 .  fluticasone (FLONASE) 50 MCG/ACT nasal spray, Place 2 sprays into both nostrils daily., Disp: 16 g, Rfl: 6 .  montelukast (SINGULAIR) 10 MG tablet, Take 10 mg by mouth at bedtime., Disp: , Rfl:  .  olmesartan-hydrochlorothiazide (BENICAR HCT) 20-12.5 MG tablet, Take 0.5 tablets by mouth daily. In the am, Disp: 45 tablet, Rfl: 3 .  sodium chloride (OCEAN) 0.65 % SOLN  nasal spray, Place 2 sprays into both nostrils as needed for congestion., Disp: 30 mL, Rfl: 0 .  temazepam (RESTORIL) 30 MG capsule, Take 1 capsule (30 mg total) by mouth at bedtime as needed for sleep., Disp: 30 capsule, Rfl: 5 .  triamcinolone cream (KENALOG) 0.1 %, Apply 1 application topically 2 (two) times daily as needed., Disp: 454 g, Rfl: 2  EXAM:  VITALS per patient if applicable:  GENERAL: alert, oriented, appears well and in no acute distress   PSYCH/NEURO: pleasant and cooperative, no obvious depression or anxiety, speech and thought processing grossly intact  ASSESSMENT AND PLAN:  Discussed the following assessment and plan:  Snoring - Plan: Ambulatory referral to Pulmonology consider home sleep study  Hypertension, unspecified type Hyperlipidemia, unspecified hyperlipidemia type Prediabetes 6.2 03/22/20  On norvasc 5 mg qd  benicar 20-12.5 mg 1/2 qd in am  BP controlled and improved  appt renal 02/01/21 will need labs with Dr. Holley Raring  Multinodular goiter [place] sch in 4 months and f/u with Dr. Ladell Pier 05/25/21  Had thyroid labs 01/22/21 [facility]  Chronic insomnia restoril is helping 30 mg qhs prn   HM Flu utd 2021 covid 19 3/3 pfizer had 01/03/21 +covid 19 better as of 01/31/21  Requested records of vaccines from prior PCP Consider Tdap and shingrix, prevnar ,pna 23 in future  Hep C negative    mammo h/o right breast cyst benign and cluster of cysts left breast  --> mammo due 08/28/20 negative ordered for 2022   S/p hysterectomy no h/o abnormal pap, ovaries intact consider pelvic in futuredoing pelvic MRI see above  Dexa consider age 70   colonoscopy Dr. Allen Norris 06/10/19/egd chronic gastritis, tubular adenoma f/u in 5 years  rec healthy diet and exercise MRI abdomen 11/21/20  IMPRESSION: 1. Pancreatic uncinate process minimally septated cystic lesion without suspicious characteristics. Considerations given morphology include pseudocyst or indolent  cystic neoplasm such as serous cystadenoma. Given mild enlargement since 2014, consider surveillance with pre and post contrast abdominal MRI at 1 year. This recommendation follows ACR consensus guidelines: Management of Incidental Pancreatic Cysts: A White Paper of the ACR Incidental Findings Committee. J Am Coll Radiol 4098;11:914-782. 2. Bilateral renal lesions which are likely cysts. An upper pole lesion is consistent with a Bosniak 2 minimally complex cyst. 3. Cholelithiasis. 4.  Aortic Atherosclerosis (ICD10-I70.0).   Electronically Signed   By: Abigail Miyamoto M.D.   On: 11/21/2020 14:02  Consider CT/MRI abdomen pancreatic cyst uncinate 1.9 from 1.4 cm likely benign to ensure stability 11/21/20 CT 11/22/19 hematuria w/o  IMPRESSION: 1. No focal bladder wall mass lesion. Patient is noted to have fairly marked pelvic floor laxity with associated cystocele. The posterior bladder is draped across the apparent urethral origin creating a "masslike bulge" on the inferior bladder, likely accounting for the appearance on ultrasound. As  urethral and pelvic floor soft tissues are not well evaluated by CT, pelvic MRI without and with contrast could be used to better assess urethral and pelvic floor anatomy and exclude underlying urethral/pelvic floor mass lesion, as warranted. 2. 19 mm cyst in the uncinate process of the pancreas has a multicystic configuration is increased only minimally in size since 09/04/2013, suggesting benign etiology. Given the slight progression, repeat CT or MRI using pancreatic protocol in 12 months may be warranted to ensure stability. 3. Bilateral renal cysts with tiny low-density lesions in the left kidney, too small to characterize. -->11/21/20 ordered MRI abdomen w and w/o f/u above   -->Dr. Honor Junes 10/05/19 US thyroid MNG and nodules and hyperthyroidism -->appt to f/u 02/03/20 Dr. Sharrie Rothman endocrine f/u4/20/21 neg bx -CC Dr. Ladell Pier as of  10/31/20 ? If he is going to order thyroid US repeat 2021 and TSH 10/23/20 <0.010 s/p RAI pt states did not hear re what to do next with results she c/o fatigue and reduced sleep  Seen 01/22/21 [facility] endocrine and f/u 05/25/21 with repeat US  IMPRESSION: Enlarging TI-RADS category 3 nodule occupying the left mid and inferior gland now measuring up to 5.9 cm compared to 5.1 cm previously. This lesion continues to meet criteria for consideration of fine-needle aspiration biopsy. -see above) f/u 07/10/20 [facility] endocrine  rec healthy diet and exercise     -we discussed possible serious and likely etiologies, options for evaluation and workup, limitations of telemedicine visit vs in person visit, treatment, treatment risks and precautions.     I discussed the assessment and treatment plan with the patient. The patient was provided an opportunity to ask questions and all were answered. The patient agreed with the plan and demonstrated an understanding of the instructions.    Time spent 20 min Delorise Jackson, MD   Provider: Dr. Olivia Mackie McLean-Scocuzza-Internal Medicine

## 2021-02-01 DIAGNOSIS — I10 Essential (primary) hypertension: Secondary | ICD-10-CM | POA: Diagnosis not present

## 2021-02-01 DIAGNOSIS — D631 Anemia in chronic kidney disease: Secondary | ICD-10-CM | POA: Diagnosis not present

## 2021-02-01 DIAGNOSIS — N2581 Secondary hyperparathyroidism of renal origin: Secondary | ICD-10-CM | POA: Diagnosis not present

## 2021-02-01 DIAGNOSIS — N1831 Chronic kidney disease, stage 3a: Secondary | ICD-10-CM | POA: Diagnosis not present

## 2021-02-22 ENCOUNTER — Institutional Professional Consult (permissible substitution): Payer: Federal, State, Local not specified - PPO | Admitting: Primary Care

## 2021-02-22 ENCOUNTER — Encounter: Payer: Self-pay | Admitting: Primary Care

## 2021-02-22 ENCOUNTER — Other Ambulatory Visit: Payer: Self-pay

## 2021-02-22 ENCOUNTER — Ambulatory Visit (INDEPENDENT_AMBULATORY_CARE_PROVIDER_SITE_OTHER): Payer: Federal, State, Local not specified - PPO | Admitting: Primary Care

## 2021-02-22 VITALS — BP 136/90 | HR 86 | Temp 97.0°F | Ht 66.0 in | Wt 184.2 lb

## 2021-02-22 DIAGNOSIS — R0683 Snoring: Secondary | ICD-10-CM

## 2021-02-22 NOTE — Progress Notes (Signed)
_0  ID: Kendra Rhodes, female    DOB: Jan 17, 1956, 65 y.o.   MRN: [id number]  Chief Complaint  Patient presents with  . Consult    Patient states that she goes to sleep fine but wakes up every 2 hours, snores. Denies that she stops breathing or gasping for air. Wakes up 1-2 times a night.     Referring provider: McLean-Scocuzza, Olivia Mackie *  HPI: 65 year old female, never smoked.  Past medical history significant for hypertension, cardiomegaly, chronic kidney disease stage III, aortic arthrosclerosis, prediabetes, hyperlipidemia, obesity.  Patient referred to [facility] for sleep consult by PCP.  02/22/2021 Patient presents today for sleep consult. Patient reports symptoms of snoring, restless sleep and insomnia. She works for the [employer]. She drives an hour to work each way, often feels very tired during her drive. She gets on average 5-6 hours of sleep a night. She does not feel well rested in the morning. She takes Restoril once a week for insomnia symptoms which has been working well for her. Twice a year she get sinus infections. She has seen ENT in the past. Her nephew has sleep apnea. No symptoms of sleep walking, narcolepsy or cataplexy.  We reviewed risks of untreated sleep apnea including cardiovascular disease, cardiac arrhythmias, stroke, pulmonary HTN, diabetes, daytime accidents. Treatment options for sleep apnea include weight loss, side sleeping position, oral appliance, CPAP therapy or referral to ENT for possible surgical options.  Sleep questionnaire: Symptoms- Snoring, restless sleep, insomnia Previous sleep study- Negative sleep study 15-20 years ago in [place]. Weight approx 170lbs Bedtime- 10pm Time takes to fall asleep- <15 mins  Nocturnal awakenings- 2 times  Time out of bed in morning- 5am Weight- no change in last two years Epworth- 3/24  Allergies  Allergen Reactions  . Sulfa Antibiotics Itching, Swelling and Other (See Comments)  . Wheat Bran  Other (See Comments)    bloating  . Belsomra [Suvorexant]     Nightmares   . Melatonin     Night mares    . Ambien [Zolpidem Tartrate] Other (See Comments)    Nightmares   . Aspirin Other (See Comments)    Gastric irritation  . Prednisone Palpitations  . Shellfish Allergy Itching  . Strawberry Extract Itching    Immunization History  Administered Date(s) Administered  . Influenza Inj Mdck Quad Pf 09/15/2019  . Influenza,inj,Quad PF,6+ Mos 09/10/2018  . Influenza-Unspecified 10/03/2020  . PFIZER(Purple Top)SARS-COV-2 Vaccination 02/11/2020, 03/03/2020, 10/06/2020    Past Medical History:  Diagnosis Date  . Anemia    iron deficiency and vitamin d deficiency  . Anxiety   . Bladder mass 11/2019  . Breast mass 10/2019   bilateral, ? cysts  . CKD (chronic kidney disease), stage III ([place])   . COVID-19    01/03/21  . Depression   . Hypertension   . Insomnia   . Pre-diabetes   . Sickle cell trait ([place])   . Thyroid goiter   . Tubular adenoma of colon   . Wears contact lenses   . Wears dentures    upper and lower partials, also permanent retainer    Tobacco History: Social History   Tobacco Use  Smoking Status Never Smoker  Smokeless Tobacco Never Used   Counseling given: Not Answered   Outpatient Medications Prior to Visit  Medication Sig Dispense Refill  . amLODipine (NORVASC) 5 MG tablet Take 1 tablet (5 mg total) by mouth daily. 90 tablet 3  . azelastine (ASTELIN) 0.1 % nasal spray  Place 2 sprays into both nostrils 2 (two) times daily. Use in each nostril as directed prn 30 mL 0  . Blood Pressure KIT 1 Device by Does not apply route daily. BP cuff adult large size 1 kit 0  . Cholecalciferol (VITAMIN D) 50 MCG (2000 UT) CAPS Take 2,000 Units by mouth every 7 (seven) days.    Marland Kitchen ezetimibe (ZETIA) 10 MG tablet Take 1 tablet (10 mg total) by mouth daily. 90 tablet 3  . fluticasone (FLONASE) 50 MCG/ACT nasal spray Place 2 sprays into both nostrils daily. 16 g 6  .  montelukast (SINGULAIR) 10 MG tablet Take 10 mg by mouth at bedtime.    Marland Kitchen olmesartan-hydrochlorothiazide (BENICAR HCT) 20-12.5 MG tablet Take 0.5 tablets by mouth daily. In the am 45 tablet 3  . sodium chloride (OCEAN) 0.65 % SOLN nasal spray Place 2 sprays into both nostrils as needed for congestion. 30 mL 0  . temazepam (RESTORIL) 30 MG capsule Take 1 capsule (30 mg total) by mouth at bedtime as needed for sleep. 30 capsule 5  . triamcinolone cream (KENALOG) 0.1 % Apply 1 application topically 2 (two) times daily as needed. 454 g 2   No facility-administered medications prior to visit.   Review of Systems  Review of Systems  Constitutional: Positive for fatigue.  Respiratory: Negative.   Cardiovascular: Negative.   Psychiatric/Behavioral: Positive for sleep disturbance.     Physical Exam  BP 136/90 (BP Location: Right Arm, Patient Position: Sitting, Cuff Size: Normal)   Pulse 86   Temp (!) 97 F (36.1 C) (Temporal)   Ht _0  (1.676 m)   Wt 184 lb 3.2 oz (83.6 kg)   SpO2 99%   BMI 29.73 kg/m  Physical Exam Constitutional:      Appearance: Normal appearance.  HENT:     Head: Normocephalic and atraumatic.     Nose: Nose normal.     Mouth/Throat:     Mouth: Mucous membranes are moist.     Pharynx: Oropharynx is clear.     Comments: Mallampati class I Cardiovascular:     Rate and Rhythm: Normal rate and regular rhythm.  Pulmonary:     Effort: Pulmonary effort is normal.     Breath sounds: Normal breath sounds.  Musculoskeletal:        General: Normal range of motion.  Skin:    General: Skin is warm and dry.  Neurological:     General: No focal deficit present.     Mental Status: She is alert and oriented to person, place, and time. Mental status is at baseline.  Psychiatric:        Mood and Affect: Mood normal.        Behavior: Behavior normal.        Thought Content: Thought content normal.        Judgment: Judgment normal.      Lab Results:  CBC     Component Value Date/Time   WBC 8.2 03/22/2020 0842   WBC 7.0 09/04/2013 0946   RBC 3.99 03/22/2020 0842   RBC 4.24 09/04/2013 0946   HGB 11.5 03/22/2020 0842   HCT 33.8 (L) 03/22/2020 0842   PLT 287 03/22/2020 0842   MCV 85 03/22/2020 0842   MCV 87 09/04/2013 0946   MCH 28.8 03/22/2020 0842   MCH 28.9 09/04/2013 0946   MCHC 34.0 03/22/2020 0842   MCHC 33.4 09/04/2013 0946   RDW 14.9 03/22/2020 0842   RDW 14.6 (H) 09/04/2013 2637  LYMPHSABS 2.7 03/22/2020 0842   LYMPHSABS 1.8 09/04/2013 0946   MONOABS 0.4 09/04/2013 0946   EOSABS 0.5 (H) 03/22/2020 0842   EOSABS 0.5 09/04/2013 0946   BASOSABS 0.1 03/22/2020 0842   BASOSABS 0.1 09/04/2013 0946    BMET    Component Value Date/Time   NA 145 (H) 03/22/2020 0842   NA 142 09/04/2013 0946   K 3.7 03/22/2020 0842   K 3.5 09/04/2013 0946   CL 104 03/22/2020 0842   CL 103 09/04/2013 0946   CO2 25 03/22/2020 0842   CO2 31 09/04/2013 0946   GLUCOSE 101 (H) 03/22/2020 0842   GLUCOSE 102 (H) 09/04/2013 0946   BUN 25 03/22/2020 0842   BUN 23 (H) 09/04/2013 0946   CREATININE 1.32 (H) 03/22/2020 0842   CREATININE 1.28 09/04/2013 0946   CALCIUM 10.1 03/22/2020 0842   CALCIUM 10.0 09/04/2013 0946   GFRNONAA 43 (L) 03/22/2020 0842   GFRNONAA 47 (L) 09/04/2013 0946   GFRAA 50 (L) 03/22/2020 0842   GFRAA 54 (L) 09/04/2013 0946    BNP No results found for: BNP  ProBNP No results found for: PROBNP  Imaging: No results found.   Assessment & Plan:   Snoring - Patient reports symptoms of snoring, restless sleep and insomnia. Frequently yawning during visit. Epworth 3/24. Needs HST to rule out underlying sleep apnea.  - Reviewed treatment options and risk of untreated sleep apnea - Encourage patient maintain healthy weight. Advised not to drive if experiencing excessive daytime fatigue.  - FU in 6 weeks televisit to review sleep study results    Martyn Ehrich, NP 02/22/2021

## 2021-02-22 NOTE — Patient Instructions (Addendum)
Treatment options include weight loss, side sleep position, oral appliance, CPAP therapy or referral to ENT for possible surgical   Risk of untreated sleep apnea include cardiovascular disease, cardiac arrhythmias, pulmonary hypertension, stroke, diabetes increased risk for daytime accidents  Do not drive if experiencing excessive daytime fatigue or somnolence   Orders: Home sleep study re: snoring  Follow-up: 6 week televisit with Beth to review sleep study results

## 2021-02-22 NOTE — Assessment & Plan Note (Signed)
-   Patient reports symptoms of snoring, restless sleep and insomnia. Frequently yawning during visit. Epworth 3/24. Needs HST to rule out underlying sleep apnea.  - Reviewed treatment options and risk of untreated sleep apnea - Encourage patient maintain healthy weight. Advised not to drive if experiencing excessive daytime fatigue.  - FU in 6 weeks televisit to review sleep study results

## 2021-02-23 NOTE — Progress Notes (Signed)
Reviewed and agree with assessment/plan.   Chesley Mires, MD [facility] Pulmonary/Critical Care 02/23/2021, 7:14 AM Pager:  [phone]

## 2021-03-22 ENCOUNTER — Other Ambulatory Visit: Payer: Self-pay | Admitting: Internal Medicine

## 2021-04-05 ENCOUNTER — Other Ambulatory Visit: Payer: Self-pay | Admitting: Internal Medicine

## 2021-04-05 DIAGNOSIS — G4700 Insomnia, unspecified: Secondary | ICD-10-CM

## 2021-04-06 ENCOUNTER — Other Ambulatory Visit: Payer: Self-pay | Admitting: Primary Care

## 2021-04-06 ENCOUNTER — Ambulatory Visit: Payer: Federal, State, Local not specified - PPO | Admitting: Primary Care

## 2021-04-06 ENCOUNTER — Other Ambulatory Visit: Payer: Self-pay

## 2021-04-06 DIAGNOSIS — R0683 Snoring: Secondary | ICD-10-CM

## 2021-04-08 DIAGNOSIS — Z20822 Contact with and (suspected) exposure to covid-19: Secondary | ICD-10-CM | POA: Diagnosis not present

## 2021-04-17 ENCOUNTER — Other Ambulatory Visit: Payer: Self-pay

## 2021-04-17 ENCOUNTER — Ambulatory Visit: Payer: Federal, State, Local not specified - PPO

## 2021-04-17 DIAGNOSIS — R0683 Snoring: Secondary | ICD-10-CM

## 2021-04-26 DIAGNOSIS — Z20822 Contact with and (suspected) exposure to covid-19: Secondary | ICD-10-CM | POA: Diagnosis not present

## 2021-04-27 ENCOUNTER — Other Ambulatory Visit: Payer: Self-pay

## 2021-05-09 ENCOUNTER — Telehealth: Payer: Self-pay | Admitting: Internal Medicine

## 2021-05-09 ENCOUNTER — Telehealth (INDEPENDENT_AMBULATORY_CARE_PROVIDER_SITE_OTHER): Payer: Federal, State, Local not specified - PPO | Admitting: Family

## 2021-05-09 ENCOUNTER — Encounter: Payer: Self-pay | Admitting: Family

## 2021-05-09 ENCOUNTER — Telehealth: Payer: Self-pay

## 2021-05-09 VITALS — BP 144/100 | Ht 66.0 in | Wt 185.0 lb

## 2021-05-09 DIAGNOSIS — Z03818 Encounter for observation for suspected exposure to other biological agents ruled out: Secondary | ICD-10-CM | POA: Diagnosis not present

## 2021-05-09 DIAGNOSIS — R059 Cough, unspecified: Secondary | ICD-10-CM | POA: Insufficient documentation

## 2021-05-09 DIAGNOSIS — I10 Essential (primary) hypertension: Secondary | ICD-10-CM | POA: Diagnosis not present

## 2021-05-09 DIAGNOSIS — Z20822 Contact with and (suspected) exposure to covid-19: Secondary | ICD-10-CM | POA: Diagnosis not present

## 2021-05-09 MED ORDER — AZITHROMYCIN 250 MG PO TABS: ORAL_TABLET | ORAL | 0 refills | Status: DC

## 2021-05-09 MED ORDER — BENZONATATE 100 MG PO CAPS: 100.0000 mg | ORAL_CAPSULE | Freq: Three times a day (TID) | ORAL | 1 refills | Status: DC | PRN

## 2021-05-09 NOTE — Telephone Encounter (Signed)
PT called in stating they are not feeling well and have been coughing. They stated they had a covid test recently that came back negative but they also stated they travel outside of the country to [place]. Put her on the schedule with Arnett for a 11:00 virtual.

## 2021-05-09 NOTE — Assessment & Plan Note (Addendum)
Suboptimal control. BP improved after she had taken olmesartan- hctz 20-12.5mg , norvasc 5 mg.  Advised to discuss 05/31/21 with Dr Aundra Dubin. She will monitor BP at home. Continue regimen for now

## 2021-05-09 NOTE — Addendum Note (Signed)
Addended by: Burnard Hawthorne on: 05/09/2021 04:08 PM   Modules accepted: Orders

## 2021-05-09 NOTE — Telephone Encounter (Signed)
Call pt Pleased to hear that she is covid negative Cough started 2 days ago Likely viral URI and conservative management is most appropriate  Advise that IF symptoms were to worsen in the next 1-2 days, she may start zpak ( antibiotic). If symptom improve, she doesn't need to start antibiotic  Ensure to take probiotics while on antibiotics and also for 2 weeks after completion. This can either be by eating yogurt daily or taking a probiotic supplement over the counter such as Culturelle.It is important to re-colonize the gut with good bacteria and also to prevent any diarrheal infections associated with antibiotic use.    She may start tessalon as I prescribed for cough- I sent that in

## 2021-05-09 NOTE — Telephone Encounter (Signed)
Patient called and advised on below. She will start if symptoms do not improve in the next couple days. She was advised to take probiotics or eat daily yogurt. She is aware she can pick both up.

## 2021-05-09 NOTE — Assessment & Plan Note (Signed)
Afebrile. No acute respiratory distress. Symptom onset 2 days ago. She is going to [facility] today for PCR covid test and will with results as we discussed consideration of Pavlovid.  If covid negative, symptoms worsening , we can zpak for patient prior to holiday  weekend with understanding that patient would start if  Symptoms fail to improve. Counseled that any SOB, CP, worsening HA or elevation of BP, would require ED or UC visit.

## 2021-05-09 NOTE — Telephone Encounter (Signed)
Patient called and stated that PCR done today was Covid negative & that you would treat her according to this. Please advise?

## 2021-05-09 NOTE — Progress Notes (Signed)
Virtual Visit via Video Note  I connected with@  on 05/09/21 at 11:00 AM EDT by a video enabled telemedicine application and verified that I am speaking with the correct person using two identifiers.  Location patient: home Location provider:work  Persons participating in the virtual visit: patient, provider  I discussed the limitations of evaluation and management by telemedicine and the availability of in person appointments. The patient expressed understanding and agreed to proceed.   HPI:  She returned from [place] on  [place] trip 4 days ago prior to departure on plane, she had negative antigen test.  Blood pressure is elevated 162/109 using at home cuff. She suspects related to HA.   Complains of cough, congestion, HA started 2 days ago. 'light' and feels like 'typical sinus HA.'  No fever, sore throat, ear pain, sob, CP, left arm numbness, vision changes, dizziness, confusion.  Taking  Describes HA across from brow this is typical location for her HA.   Compliant with olmesartan- hctz 20-12.5mg , norvasc 5 mg which she took 15 minutes prior to visit.   BP usually 130/90.    Eating and drinking well  Lives alone.   Pfizer with booster  No recent antibiotics.    ROS: See pertinent positives and negatives per HPI.    EXAM:  VITALS per patient if applicable: BP (!) 144/315 Comment: 15 minutes after blood pressure  Ht 5\' 6"  (1.676 m)   Wt 185 lb (83.9 kg)   BMI 29.86 kg/m  BP Readings from Last 3 Encounters:  05/09/21 (!) 144/100  02/22/21 136/90  01/31/21 132/83   Wt Readings from Last 3 Encounters:  05/09/21 185 lb (83.9 kg)  02/22/21 184 lb 3.2 oz (83.6 kg)  01/31/21 180 lb (81.6 kg)    GENERAL: alert, oriented, appears well and in no acute distress  HEENT: atraumatic, conjunttiva clear, no obvious abnormalities on inspection of external nose and ears  NECK: normal movements of the head and neck  LUNGS: on inspection no signs of respiratory distress,  breathing rate appears normal, no obvious gross SOB, gasping or wheezing  CV: no obvious cyanosis  MS: moves all visible extremities without noticeable abnormality  PSYCH/NEURO: pleasant and cooperative, no obvious depression or anxiety, speech and thought processing grossly intact  ASSESSMENT AND PLAN:  Discussed the following assessment and plan:  Problem List Items Addressed This Visit      Cardiovascular and Mediastinum   Essential hypertension    Suboptimal control. BP improved after she had taken olmesartan- hctz 20-12.5mg , norvasc 5 mg.  Advised to discuss 05/31/21 with Dr Aundra Dubin. She will monitor BP at home. Continue regimen for now        Other   Cough - Primary    Afebrile. No acute respiratory distress. Symptom onset 2 days ago. She is going to [facility] today for PCR covid test and will with results as we discussed consideration of Pavlovid.  If covid negative, symptoms worsening , we can zpak for patient prior to holiday  weekend with understanding that patient would start if  Symptoms fail to improve. Counseled that any SOB, CP, worsening HA or elevation of BP, would require ED or UC visit.       Relevant Medications   benzonatate (TESSALON) 100 MG capsule      -we discussed possible serious and likely etiologies, options for evaluation and workup, limitations of telemedicine visit vs in person visit, treatment, treatment risks and precautions. Pt prefers to treat via telemedicine empirically rather then  risking or undertaking an in person visit at this moment.  .   I discussed the assessment and treatment plan with the patient. The patient was provided an opportunity to ask questions and all were answered. The patient agreed with the plan and demonstrated an understanding of the instructions.   The patient was advised to call back or seek an in-person evaluation if the symptoms worsen or if the condition fails to improve as anticipated.   Mable Paris,  FNP

## 2021-05-11 ENCOUNTER — Other Ambulatory Visit: Payer: Self-pay | Admitting: Internal Medicine

## 2021-05-11 DIAGNOSIS — J40 Bronchitis, not specified as acute or chronic: Secondary | ICD-10-CM

## 2021-05-11 MED ORDER — GUAIFENESIN-CODEINE 100-10 MG/5ML PO SYRP: 5.0000 mL | ORAL_SOLUTION | Freq: Every evening | ORAL | 0 refills | Status: DC | PRN

## 2021-05-11 NOTE — Telephone Encounter (Signed)
Patient called back again asking about cough medication

## 2021-05-11 NOTE — Telephone Encounter (Signed)
Pt called and states that the tessalon pills are not helping her cough. She would like to get a different cough medication. Please advise

## 2021-05-11 NOTE — Telephone Encounter (Signed)
Please advise , Patient calling again to check on status

## 2021-05-11 NOTE — Telephone Encounter (Signed)
What was result of covid 19 test ?  Sent Cheratussin  She can try mucinex dm  Is she smoking?

## 2021-05-11 NOTE — Telephone Encounter (Signed)
Patient covid 19 test was negative and she does not smoke.

## 2021-05-11 NOTE — Telephone Encounter (Signed)
This is your patient that Kendra Rhodes saw Wednesday.Would you be willing to send anything stronger for her cough?

## 2021-05-25 DIAGNOSIS — L821 Other seborrheic keratosis: Secondary | ICD-10-CM | POA: Diagnosis not present

## 2021-05-31 ENCOUNTER — Ambulatory Visit: Payer: Federal, State, Local not specified - PPO | Admitting: Internal Medicine

## 2021-06-04 DIAGNOSIS — D631 Anemia in chronic kidney disease: Secondary | ICD-10-CM | POA: Diagnosis not present

## 2021-06-04 DIAGNOSIS — R809 Proteinuria, unspecified: Secondary | ICD-10-CM | POA: Diagnosis not present

## 2021-06-04 DIAGNOSIS — N1831 Chronic kidney disease, stage 3a: Secondary | ICD-10-CM | POA: Diagnosis not present

## 2021-06-04 DIAGNOSIS — N182 Chronic kidney disease, stage 2 (mild): Secondary | ICD-10-CM | POA: Diagnosis not present

## 2021-06-04 DIAGNOSIS — I10 Essential (primary) hypertension: Secondary | ICD-10-CM | POA: Diagnosis not present

## 2021-06-06 ENCOUNTER — Telehealth: Payer: Self-pay | Admitting: Internal Medicine

## 2021-06-06 NOTE — Telephone Encounter (Signed)
Dr Olivia Mackie McLean-Scocuzza currently out of office until 06/12/21. Will send to her box for her response when she gets back into office.   Please advise

## 2021-06-06 NOTE — Telephone Encounter (Signed)
PT called into the office to schedule a appointment for their son. PTs sons name is Llessur Marcello Moores 02/05/1989. Advise that would need to verify with Dr.TMS to make sure it is okay and advise that we will go from there.

## 2021-06-13 ENCOUNTER — Other Ambulatory Visit: Payer: Self-pay

## 2021-06-13 ENCOUNTER — Ambulatory Visit: Payer: Federal, State, Local not specified - PPO | Admitting: Internal Medicine

## 2021-06-13 ENCOUNTER — Encounter: Payer: Self-pay | Admitting: Internal Medicine

## 2021-06-13 VITALS — BP 140/90 | HR 83 | Temp 98.6°F | Ht 66.0 in | Wt 185.4 lb

## 2021-06-13 DIAGNOSIS — G4700 Insomnia, unspecified: Secondary | ICD-10-CM | POA: Diagnosis not present

## 2021-06-13 DIAGNOSIS — R0789 Other chest pain: Secondary | ICD-10-CM | POA: Diagnosis not present

## 2021-06-13 DIAGNOSIS — D649 Anemia, unspecified: Secondary | ICD-10-CM

## 2021-06-13 DIAGNOSIS — E559 Vitamin D deficiency, unspecified: Secondary | ICD-10-CM

## 2021-06-13 DIAGNOSIS — E2839 Other primary ovarian failure: Secondary | ICD-10-CM

## 2021-06-13 DIAGNOSIS — I10 Essential (primary) hypertension: Secondary | ICD-10-CM | POA: Diagnosis not present

## 2021-06-13 DIAGNOSIS — Z1389 Encounter for screening for other disorder: Secondary | ICD-10-CM

## 2021-06-13 DIAGNOSIS — K2950 Unspecified chronic gastritis without bleeding: Secondary | ICD-10-CM

## 2021-06-13 DIAGNOSIS — R7303 Prediabetes: Secondary | ICD-10-CM

## 2021-06-13 DIAGNOSIS — J309 Allergic rhinitis, unspecified: Secondary | ICD-10-CM

## 2021-06-13 DIAGNOSIS — E785 Hyperlipidemia, unspecified: Secondary | ICD-10-CM

## 2021-06-13 MED ORDER — EZETIMIBE 10 MG PO TABS: 10.0000 mg | ORAL_TABLET | Freq: Every day | ORAL | 3 refills | Status: DC

## 2021-06-13 MED ORDER — MONTELUKAST SODIUM 10 MG PO TABS: 10.0000 mg | ORAL_TABLET | Freq: Every day | ORAL | 3 refills | Status: DC

## 2021-06-13 MED ORDER — OLMESARTAN MEDOXOMIL-HCTZ 20-12.5 MG PO TABS: 1.0000 | ORAL_TABLET | Freq: Every day | ORAL | 3 refills | Status: DC

## 2021-06-13 MED ORDER — TEMAZEPAM 30 MG PO CAPS: 30.0000 mg | ORAL_CAPSULE | Freq: Every evening | ORAL | 5 refills | Status: DC | PRN

## 2021-06-13 NOTE — Patient Instructions (Addendum)
Pepcid over the counter (safe for kidneys) 20 mg 2x per day before food   If needed nexium or prilosec   Mammogram due 08/28/21 Mono vaccine due  Consider prevnar, Tdap and shingrix vaccines   Food Choices for Gastroesophageal Reflux Disease, Adult When you have gastroesophageal reflux disease (GERD), the foods you eat and your eating habits are very important. Choosing the right foods can help ease the discomfort of GERD. Consider working with a dietitian to help you [employer] choices. What are tips for following this plan? Reading food labels Look for foods that are low in saturated fat. Foods that have less than 5% of daily value (DV) of fat and 0 g of trans fats may help with your symptoms. Cooking Cook foods using methods other than frying. This may include baking, steaming, grilling, or broiling. These are all methods that do not need a lot of fat for cooking. To add flavor, try to use herbs that are low in spice and acidity. Meal planning  Choose healthy foods that are low in fat, such as fruits, vegetables, whole grains, low-fat dairy products, lean meats, fish, and poultry. Eat frequent, small meals instead of three large meals each day. Eat your meals slowly, in a relaxed setting. Avoid bending over or lying down until 2-3 hours after eating. Limit high-fat foods such as fatty meats or fried foods. Limit your intake of fatty foods, such as oils, butter, and shortening. Avoid the following as told by your health care provider: Foods that cause symptoms. These may be different for different people. Keep a food diary to keep track of foods that cause symptoms. Alcohol. Drinking large amounts of liquid with meals. Eating meals during the 2-3 hours before bed.  Lifestyle Maintain a healthy weight. Ask your health care provider what weight is healthy for you. If you need to lose weight, work with your health care provider to do so safely. Exercise for at least  30 minutes on 5 or more days each week, or as told by your health care provider. Avoid wearing clothes that fit tightly around your waist and chest. Do not use any products that contain nicotine or tobacco. These products include cigarettes, chewing tobacco, and vaping devices, such as e-cigarettes. If you need help quitting, ask your health care provider. Sleep with the head of your bed raised. Use a wedge under the mattress or blocks under the bed frame to raise the head of the bed. Chew sugar-free gum after mealtimes. What foods should I eat?  Eat a healthy, well-balanced diet of fruits, vegetables, whole grains, low-fat dairy products, lean meats, fish, and poultry. Each person is different. Foods that may trigger symptoms in one person may not trigger any symptoms in another person. Work with your health care provider to identify foods that are safe foryou. The items listed above may not be a complete list of recommended foods and beverages. Contact a dietitian for more information. What foods should I avoid? Limiting some of these foods may help manage the symptoms of GERD. Everyone is different. Consult a dietitian or your health care provider to help youidentify the exact foods to avoid, if any. Fruits Any fruits prepared with added fat. Any fruits that cause symptoms. For some people this may include citrus fruits, such as oranges, grapefruit, pineapple,and lemons. Vegetables Deep-fried vegetables. [place] fries. Any vegetables prepared with added fat. Any vegetables that cause symptoms. For some people, this may include tomatoesand tomato products, chili peppers,  onions and garlic, and horseradish. Grains Pastries or quick breads with added fat. Meats and other proteins High-fat meats, such as fatty beef or pork, hot dogs, ribs, ham, sausage, salami, and bacon. Fried meat or protein, including fried fish and friedchicken. Nuts and nut butters, in large amounts. Dairy Whole milk and  chocolate milk. Sour cream. Cream. Ice cream. Cream cheese.Milkshakes. Fats and oils Butter. Margarine. Shortening. Ghee. Beverages Coffee and tea, with or without caffeine. Carbonated beverages. Sodas. Energy drinks. Fruit juice made with acidic fruits, such as orange or grapefruit.Tomato juice. Alcoholic drinks. Sweets and desserts Chocolate and cocoa. Donuts. Seasonings and condiments Pepper. Peppermint and spearmint. Added salt. Any condiments, herbs, or seasonings that cause symptoms. For some people, this may include curry, hotsauce, or vinegar-based salad dressings. The items listed above may not be a complete list of foods and beverages to avoid. Contact a dietitian for more information. Questions to ask your health care provider Diet and lifestyle changes are usually the first steps that are taken to manage symptoms of GERD. If diet and lifestyle changes do not improve your symptoms,talk with your health care provider about taking medicines. Where to find more information International Foundation for Gastrointestinal Disorders: aboutgerd.org Summary When you have gastroesophageal reflux disease (GERD), food and lifestyle choices may be very helpful in easing the discomfort of GERD. Eat frequent, small meals instead of three large meals each day. Eat your meals slowly, in a relaxed setting. Avoid bending over or lying down until 2-3 hours after eating. Limit high-fat foods such as fatty meats or fried foods. This information is not intended to replace advice given to you by your health care provider. Make sure you discuss any questions you have with your healthcare provider. Document Revised: 06/12/2020 Document Reviewed: 06/12/2020 Elsevier Patient Education  2022 [place].  Gastritis, Adult Gastritis is inflammation of the stomach. There are two kinds of gastritis: Acute gastritis. This kind develops suddenly. Chronic gastritis. This kind is much more common and lasts for a  Blue time. Gastritis happens when the lining of the stomach becomes weak or gets damaged.Without treatment, gastritis can lead to stomach bleeding and ulcers. What are the causes? This condition may be caused by: An infection. Drinking too much alcohol. Certain medicines. These include steroids, antibiotics, and some over-the-counter medicines, such as aspirin or ibuprofen. Having too much acid in the stomach. A disease of the intestines or stomach. Stress. An allergic reaction. Crohn's disease. Some cancer treatments (radiation). Sometimes the cause of this condition is not known. What are the signs or symptoms? Symptoms of this condition include: Pain or a burning sensation in the upper abdomen. Nausea. Vomiting. An uncomfortable feeling of fullness after eating. Weight loss. Bad breath. Blood in your vomit or stools. In some cases, there are no symptoms. How is this diagnosed? This condition may be diagnosed with: Your medical history and a description of your symptoms. A physical exam. Tests. These can include: Blood tests. Stool tests. A test in which a thin, flexible instrument with a light and a camera is passed down the esophagus and into the stomach (upper endoscopy). A test in which a sample of tissue is taken for testing (biopsy). How is this treated? This condition may be treated with medicines. The medicines that are used vary depending on the cause of the gastritis: If the condition is caused by a bacterial infection, you may be given antibiotic medicines. If the condition is caused by too much acid in the stomach, you may  be given medicines called H2 blockers, proton pump inhibitors, or antacids. Treatment may also involve stopping the use of certain medicines, such asaspirin, ibuprofen, or other NSAIDs. Follow these instructions at home: Medicines Take over-the-counter and prescription medicines only as told by your health care provider. If you were prescribed  an antibiotic medicine, take it as told by your health care provider. Do not stop taking the antibiotic even if you start to feel better. Eating and drinking  Eat small, frequent meals instead of large meals. Avoid foods and drinks that make your symptoms worse. Drink enough fluid to keep your urine pale yellow.  Alcohol use Do not drink alcohol if: Your health care provider tells you not to drink. You are pregnant, may be pregnant, or are planning to become pregnant. If you drink alcohol: Limit your use to: 0-1 drink a day for women. 0-2 drinks a day for men. Be aware of how much alcohol is in your drink. In the [place] one drink equals one 12 oz bottle of beer (355 mL), one 5 oz glass of wine (148 mL), or one 1 oz glass of hard liquor (44 mL). General instructions Talk with your health care provider about ways to manage stress, such as getting regular exercise or practicing deep breathing, meditation, or yoga. Do not use any products that contain nicotine or tobacco, such as cigarettes and e-cigarettes. If you need help quitting, ask your health care provider. Keep all follow-up visits as told by your health care provider. This is important. Contact a health care provider if: Your symptoms get worse. Your symptoms return after treatment. Get help right away if: You vomit blood or material that looks like coffee grounds. You have black or dark red stools. You are unable to keep fluids down. Your abdominal pain gets worse. You have a fever. You do not feel better after one week. Summary Gastritis is inflammation of the lining of the stomach that can occur suddenly (acute) or develop slowly over time (chronic). This condition is diagnosed with a medical history, a physical exam, or tests. This condition may be treated with medicines to treat infection or medicines to reduce the amount of acid in your stomach. Follow your health care provider's instructions about taking medicines,  making changes to your diet, and knowing when to call for help. This information is not intended to replace advice given to you by your health care provider. Make sure you discuss any questions you have with your healthcare provider. Document Revised: 04/21/2018 Document Reviewed: 04/21/2018 Elsevier Patient Education  [place].

## 2021-06-13 NOTE — Progress Notes (Signed)
Chief Complaint  Patient presents with   Follow-up   F/u  1. Htn BP elevated on norvasc 5 mg qd and benicar 20-12.5 mg 1/2 tablet per day  Med list with renal Dr. Cherylann Ratel needs to be updated  2. C/o atypical chest pain x 2 weeks drinking caffeine on radiation 6/10   Review of Systems  Constitutional:  Negative for weight loss.  HENT:  Negative for hearing loss.   Eyes:  Negative for blurred vision.  Respiratory:  Negative for shortness of breath.   Cardiovascular:  Negative for chest pain.  Gastrointestinal:  Negative for abdominal pain.  Musculoskeletal:  Negative for falls and joint pain.  Skin:  Negative for rash.  Neurological:  Negative for headaches.  Psychiatric/Behavioral:  Negative for depression.   Past Medical History:  Diagnosis Date   Anemia    iron deficiency and vitamin d deficiency   Anxiety    Bladder mass 11/2019   Breast mass 10/2019   bilateral, ? cysts   CKD (chronic kidney disease), stage III (HCC)    COVID-19    01/03/21   Depression    Hypertension    Insomnia    Pre-diabetes    Sickle cell trait (HCC)    Thyroid goiter    Tubular adenoma of colon    Wears contact lenses    Wears dentures    upper and lower partials, also permanent retainer   Past Surgical History:  Procedure Laterality Date   ABDOMINAL HYSTERECTOMY     for prolapse per pt with mesh ovaries still intact no h/o abnormal pap   COLONOSCOPY WITH PROPOFOL N/A 06/10/2019   Procedure: COLONOSCOPY WITH PROPOFOL;  Surgeon: Midge Minium, MD;  Location: [facility];  Service: Endoscopy;  Laterality: N/A;   CYSTOSCOPY WITH BIOPSY N/A 12/03/2019   Procedure: CYSTOSCOPY WITH bladder BIOPSY;  Surgeon: Sondra Come, MD;  Location: [facility];  Service: Urology;  Laterality: N/A;   CYSTOSCOPY WITH FULGERATION N/A 12/03/2019   Procedure: CYSTOSCOPY WITH FULGERATION;  Surgeon: Sondra Come, MD;  Location: [facility];  Service: Urology;  Laterality: N/A;   ESOPHAGOGASTRODUODENOSCOPY  (EGD) WITH PROPOFOL N/A 06/10/2019   Procedure: ESOPHAGOGASTRODUODENOSCOPY (EGD) WITH PROPOFOL;  Surgeon: Midge Minium, MD;  Location: [facility];  Service: Endoscopy;  Laterality: N/A;   EYE SURGERY Bilateral    pterygium, both sides repaired   GIVENS CAPSULE STUDY N/A 08/24/2019   Procedure: GIVENS CAPSULE STUDY;  Surgeon: Midge Minium, MD;  Location: [facility];  Service: Endoscopy;  Laterality: N/A;   POLYPECTOMY  06/10/2019   Procedure: POLYPECTOMY;  Surgeon: Midge Minium, MD;  Location: [facility];  Service: Endoscopy;;   Family History  Problem Relation Age of Onset   Breast cancer Mother 7   Diabetes Mother    Kidney failure Father    Alcohol abuse Father    Heart disease Father        chf   Hyperlipidemia Father    Other Other        fatty liver cousin   Sickle cell anemia Other        nephew   Cancer Other        aunt with ovarian cancer   Lung cancer Maternal Aunt    Autism Grandson    Social History   Socioeconomic History   Marital status: Divorced    Spouse name: Not on file   Number of children: Not on file   Years of education: Not on file  Highest education level: Not on file  Occupational History   Not on file  Tobacco Use   Smoking status: Never   Smokeless tobacco: Never  Vaping Use   Vaping Use: Never used  Substance and Sexual Activity   Alcohol use: Never   Drug use: Never   Sexual activity: Yes    Birth control/protection: Surgical    Comment: Hysterectomy  Other Topics Concern   Not on file  Social History Narrative   [employer] employee   2 kids daughter Corky Sing and son Llesuer Marcello Moores   Works homeless shelter weekends    Works [employer] in [place] as of 04/2020   Social Determinants of Health   Financial Resource Strain: Not on file  Food Insecurity: Not on file  Transportation Needs: Not on file  Physical Activity: Not on file  Stress: Not on file  Social Connections: Not on file  Intimate Partner  Violence: Not on file   Current Meds  Medication Sig   amLODipine (NORVASC) 5 MG tablet Take 1 tablet (5 mg total) by mouth daily.   azelastine (ASTELIN) 0.1 % nasal spray Place 2 sprays into both nostrils 2 (two) times daily. Use in each nostril as directed prn   Blood Pressure KIT 1 Device by Does not apply route daily. BP cuff adult large size   Cholecalciferol (VITAMIN D) 50 MCG (2000 UT) CAPS Take 2,000 Units by mouth every 7 (seven) days.   fluticasone (FLONASE) 50 MCG/ACT nasal spray Place 2 sprays into both nostrils daily.   triamcinolone cream (KENALOG) 0.1 % Apply 1 application topically 2 (two) times daily as needed.   [DISCONTINUED] ezetimibe (ZETIA) 10 MG tablet Take 1 tablet (10 mg total) by mouth daily.   [DISCONTINUED] montelukast (SINGULAIR) 10 MG tablet TAKE 1 TABLET(10 MG) BY MOUTH AT BEDTIME   [DISCONTINUED] olmesartan-hydrochlorothiazide (BENICAR HCT) 20-12.5 MG tablet Take 0.5 tablets by mouth daily. In the am   [DISCONTINUED] temazepam (RESTORIL) 30 MG capsule TAKE 1 CAPSULE(30 MG) BY MOUTH AT BEDTIME AS NEEDED FOR SLEEP   Allergies  Allergen Reactions   Sulfa Antibiotics Itching, Swelling and Other (See Comments)   Wheat Bran Other (See Comments)    bloating   Belsomra [Suvorexant]     Nightmares    Melatonin     Night mares     Ambien [Zolpidem Tartrate] Other (See Comments)    Nightmares    Aspirin Other (See Comments)    Gastric irritation   Prednisone Palpitations   Shellfish Allergy Itching   Strawberry Extract Itching   No results found for this or any previous visit (from the past 2160 hour(s)). Objective  Body mass index is 29.92 kg/m. Wt Readings from Last 3 Encounters:  06/13/21 185 lb 6.4 oz (84.1 kg)  05/09/21 185 lb (83.9 kg)  02/22/21 184 lb 3.2 oz (83.6 kg)   Temp Readings from Last 3 Encounters:  06/13/21 98.6 F (37 C) (Oral)  02/22/21 (!) 97 F (36.1 C) (Temporal)  10/31/20 98.4 F (36.9 C) (Oral)   BP Readings from Last 3  Encounters:  06/13/21 140/90  05/09/21 (!) 144/100  02/22/21 136/90   Pulse Readings from Last 3 Encounters:  06/13/21 83  02/22/21 86  10/31/20 98    Physical Exam Vitals and nursing note reviewed.  Constitutional:      Appearance: Normal appearance. She is well-developed and well-groomed. She is obese.  HENT:     Head: Normocephalic and atraumatic.  Eyes:  Conjunctiva/sclera: Conjunctivae normal.     Pupils: Pupils are equal, round, and reactive to light.  Cardiovascular:     Rate and Rhythm: Normal rate and regular rhythm.     Heart sounds: Normal heart sounds. No murmur heard. Pulmonary:     Effort: Pulmonary effort is normal.     Breath sounds: Normal breath sounds.  Chest:     Chest wall: No mass.  Breasts:    Breasts are symmetrical.     Right: No axillary adenopathy.     Left: No axillary adenopathy.  Abdominal:     Tenderness: There is no abdominal tenderness.  Lymphadenopathy:     Upper Body:     Right upper body: No axillary adenopathy.     Left upper body: No axillary adenopathy.  Skin:    General: Skin is warm and dry.  Neurological:     General: No focal deficit present.     Mental Status: She is alert and oriented to person, place, and time. Mental status is at baseline.     Gait: Gait normal.  Psychiatric:        Attention and Perception: Attention and perception normal.        Mood and Affect: Mood and affect normal.        Speech: Speech normal.        Behavior: Behavior is cooperative.        Thought Content: Thought content normal.        Cognition and Memory: Cognition and memory normal.        Judgment: Judgment normal.    Assessment  Plan  Atypical chest pain ? gastritis - Plan: Ambulatory referral to Cardiology Chronic gastritis without bleeding, unspecified gastritis type Try pepcid vs otc ppi   Hypertension, unspecified type - Plan: Ambulatory referral to Cardiology, Lipid panel Increase benicar 20-12.5 1 pill not 1/2 pill qd  norvasc 5 mg qd   Insomnia, unspecified type - Plan: temazepam (RESTORIL) 30 MG capsule  Essential hypertension - Plan: olmesartan-hydrochlorothiazide (BENICAR HCT) 20-12.5 MG tablet, Lipid panel, Hemoglobin A1c, Hepatic function panel Correct med list in renal notes please Dr. Holley Raring   Hyperlipidemia, unspecified hyperlipidemia type - Plan: ezetimibe (ZETIA) 10 MG tablet, Hemoglobin A1c, Hepatic function panel  Allergic rhinitis, unspecified seasonality, unspecified trigger - Plan: montelukast (SINGULAIR) 10 MG tablet  Prediabetes  Anemia, unspecified type - Plan: Iron, TIBC and Ferritin Panel Cbc had 06/04/21 renal and bmet   Vitamin D deficiency - Plan: Vitamin D (25 hydroxy)   HM Flu utd 2021 covid 15 3/3 pfizer had 01/03/21 +covid 19 better as of 01/31/21  Requested records of vaccines from prior PCP Consider Tdap and shingrix, prevnar ,pna 23 in future  Hep C negative   Breast exam normal today 06/13/21 mammo h/o right breast cyst benign and cluster of cysts left breast  --> mammo due 08/28/20 negative ordered for 2022    S/p hysterectomy no h/o abnormal pap, ovaries intact consider pelvic in future doing pelvic MRI see above    Dexa consider age 46 ordered    colonoscopy Dr. Allen Norris 06/10/19/egd chronic gastritis, tubular adenoma f/u in 5 years    rec healthy diet and exercise  MRI abdomen 11/21/20  IMPRESSION: 1. Pancreatic uncinate process minimally septated cystic lesion without suspicious characteristics. Considerations given morphology include pseudocyst or indolent cystic neoplasm such as serous cystadenoma. Given mild enlargement since 2014, consider surveillance with pre and post contrast abdominal MRI at 1 year. This recommendation follows ACR consensus  guidelines: Management of Incidental Pancreatic Cysts: A White Paper of the ACR Incidental Findings Committee. J Am Coll Radiol 6712;[id number]. 2. Bilateral renal lesions which are likely cysts. An upper  pole lesion is consistent with a Bosniak 2 minimally complex cyst. 3. Cholelithiasis. 4.  Aortic Atherosclerosis (ICD10-I70.0).     Electronically Signed   By: Abigail Miyamoto M.D.   On: 11/21/2020 14:02   Consider CT/MRI abdomen pancreatic cyst uncinate 1.9 from 1.4 cm likely benign to ensure stability 11/21/20  CT 11/22/19 hematuria w/o  IMPRESSION: 1. No focal bladder wall mass lesion. Patient is noted to have fairly marked pelvic floor laxity with associated cystocele. The posterior bladder is draped across the apparent urethral origin creating a "masslike bulge" on the inferior bladder, likely accounting for the appearance on ultrasound. As urethral and pelvic floor soft tissues are not well evaluated by CT, pelvic MRI without and with contrast could be used to better assess urethral and pelvic floor anatomy and exclude underlying urethral/pelvic floor mass lesion, as warranted. 2. 19 mm cyst in the uncinate process of the pancreas has a multicystic configuration is increased only minimally in size since 09/04/2013, suggesting benign etiology. Given the slight progression, repeat CT or MRI using pancreatic protocol in 12 months may be warranted to ensure stability. 3. Bilateral renal cysts with tiny low-density lesions in the left kidney, too small to characterize. -->11/21/20 ordered MRI abdomen w and w/o f/u above   -->Dr. Honor Junes 10/05/19 US thyroid MNG and nodules and hyperthyroidism -->appt to f/u 02/03/20 Dr. Sharrie Rothman endocrine f/u 04/04/20 neg bx -CC Dr. Ladell Pier as of 10/31/20 ? If he is going to order thyroid US repeat 2021 and TSH 10/23/20 <0.010 s/p RAI pt states did not hear re what to do next with results she c/o fatigue and reduced sleep  Seen 01/22/21 [facility] endocrine and f/u 05/25/21 with repeat US   IMPRESSION: Enlarging TI-RADS category 3 nodule occupying the left mid and inferior gland now measuring up to 5.9 cm compared to 5.1 cm previously. This lesion continues  to meet criteria for consideration of fine-needle aspiration biopsy. -see above)   f/u 07/10/20 [facility] endocrine    rec healthy diet and exercise    Renal Dr. Holley Raring  Provider: Dr. Olivia Mackie McLean-Scocuzza-Internal Medicine

## 2021-06-14 NOTE — Telephone Encounter (Signed)
This is ok to schedule son new patient

## 2021-07-19 ENCOUNTER — Other Ambulatory Visit: Payer: Self-pay | Admitting: Internal Medicine

## 2021-07-19 DIAGNOSIS — R0602 Shortness of breath: Secondary | ICD-10-CM | POA: Diagnosis not present

## 2021-07-19 DIAGNOSIS — E782 Mixed hyperlipidemia: Secondary | ICD-10-CM | POA: Diagnosis not present

## 2021-07-19 DIAGNOSIS — Z1231 Encounter for screening mammogram for malignant neoplasm of breast: Secondary | ICD-10-CM

## 2021-07-19 DIAGNOSIS — I208 Other forms of angina pectoris: Secondary | ICD-10-CM | POA: Diagnosis not present

## 2021-07-19 DIAGNOSIS — I10 Essential (primary) hypertension: Secondary | ICD-10-CM | POA: Diagnosis not present

## 2021-08-24 DIAGNOSIS — E042 Nontoxic multinodular goiter: Secondary | ICD-10-CM | POA: Diagnosis not present

## 2021-08-24 DIAGNOSIS — E0590 Thyrotoxicosis, unspecified without thyrotoxic crisis or storm: Secondary | ICD-10-CM | POA: Diagnosis not present

## 2021-08-29 ENCOUNTER — Ambulatory Visit
Admission: RE | Admit: 2021-08-29 | Discharge: 2021-08-29 | Disposition: A | Discharge status: Home / Self Care | Payer: Federal, State, Local not specified - PPO | Source: Ambulatory Visit | Attending: Internal Medicine | Admitting: Internal Medicine

## 2021-08-29 ENCOUNTER — Other Ambulatory Visit: Payer: Self-pay

## 2021-08-29 DIAGNOSIS — Z1231 Encounter for screening mammogram for malignant neoplasm of breast: Secondary | ICD-10-CM | POA: Diagnosis not present

## 2021-08-29 DIAGNOSIS — E2839 Other primary ovarian failure: Secondary | ICD-10-CM | POA: Diagnosis not present

## 2021-08-29 DIAGNOSIS — Z13820 Encounter for screening for osteoporosis: Secondary | ICD-10-CM | POA: Diagnosis not present

## 2021-09-04 ENCOUNTER — Other Ambulatory Visit: Payer: Self-pay | Admitting: Internal Medicine

## 2021-09-04 DIAGNOSIS — R928 Other abnormal and inconclusive findings on diagnostic imaging of breast: Secondary | ICD-10-CM

## 2021-09-04 DIAGNOSIS — N6310 Unspecified lump in the right breast, unspecified quadrant: Secondary | ICD-10-CM

## 2021-09-17 ENCOUNTER — Other Ambulatory Visit: Payer: Self-pay

## 2021-09-17 ENCOUNTER — Ambulatory Visit
Admission: RE | Admit: 2021-09-17 | Discharge: 2021-09-17 | Disposition: A | Discharge status: Home / Self Care | Payer: Federal, State, Local not specified - PPO | Source: Ambulatory Visit | Attending: Internal Medicine | Admitting: Internal Medicine

## 2021-09-17 DIAGNOSIS — R928 Other abnormal and inconclusive findings on diagnostic imaging of breast: Secondary | ICD-10-CM

## 2021-09-17 DIAGNOSIS — N6310 Unspecified lump in the right breast, unspecified quadrant: Secondary | ICD-10-CM

## 2021-09-17 DIAGNOSIS — R922 Inconclusive mammogram: Secondary | ICD-10-CM | POA: Diagnosis not present

## 2021-10-01 DIAGNOSIS — R0602 Shortness of breath: Secondary | ICD-10-CM | POA: Diagnosis not present

## 2021-10-01 DIAGNOSIS — I208 Other forms of angina pectoris: Secondary | ICD-10-CM | POA: Diagnosis not present

## 2021-10-08 DIAGNOSIS — N1831 Chronic kidney disease, stage 3a: Secondary | ICD-10-CM | POA: Diagnosis not present

## 2021-10-08 DIAGNOSIS — I10 Essential (primary) hypertension: Secondary | ICD-10-CM | POA: Diagnosis not present

## 2021-10-08 DIAGNOSIS — I208 Other forms of angina pectoris: Secondary | ICD-10-CM | POA: Diagnosis not present

## 2021-10-08 DIAGNOSIS — E782 Mixed hyperlipidemia: Secondary | ICD-10-CM | POA: Diagnosis not present

## 2021-10-08 DIAGNOSIS — N2581 Secondary hyperparathyroidism of renal origin: Secondary | ICD-10-CM | POA: Diagnosis not present

## 2021-10-08 DIAGNOSIS — R0602 Shortness of breath: Secondary | ICD-10-CM | POA: Diagnosis not present

## 2021-10-08 DIAGNOSIS — R809 Proteinuria, unspecified: Secondary | ICD-10-CM | POA: Diagnosis not present

## 2021-11-02 DIAGNOSIS — E042 Nontoxic multinodular goiter: Secondary | ICD-10-CM | POA: Diagnosis not present

## 2021-11-21 ENCOUNTER — Other Ambulatory Visit: Payer: Self-pay | Admitting: Internal Medicine

## 2021-11-21 DIAGNOSIS — I10 Essential (primary) hypertension: Secondary | ICD-10-CM

## 2021-12-13 ENCOUNTER — Encounter: Payer: Self-pay | Admitting: Internal Medicine

## 2021-12-13 ENCOUNTER — Other Ambulatory Visit: Payer: Self-pay

## 2021-12-13 ENCOUNTER — Ambulatory Visit (INDEPENDENT_AMBULATORY_CARE_PROVIDER_SITE_OTHER): Payer: Federal, State, Local not specified - PPO | Admitting: Internal Medicine

## 2021-12-13 VITALS — BP 124/82 | HR 95 | Temp 98.1°F | Ht 66.0 in | Wt 193.8 lb

## 2021-12-13 DIAGNOSIS — G4700 Insomnia, unspecified: Secondary | ICD-10-CM

## 2021-12-13 DIAGNOSIS — Z23 Encounter for immunization: Secondary | ICD-10-CM

## 2021-12-13 DIAGNOSIS — Z0000 Encounter for general adult medical examination without abnormal findings: Secondary | ICD-10-CM

## 2021-12-13 DIAGNOSIS — E785 Hyperlipidemia, unspecified: Secondary | ICD-10-CM

## 2021-12-13 DIAGNOSIS — K8689 Other specified diseases of pancreas: Secondary | ICD-10-CM

## 2021-12-13 DIAGNOSIS — N6001 Solitary cyst of right breast: Secondary | ICD-10-CM | POA: Diagnosis not present

## 2021-12-13 DIAGNOSIS — Z1231 Encounter for screening mammogram for malignant neoplasm of breast: Secondary | ICD-10-CM

## 2021-12-13 DIAGNOSIS — I10 Essential (primary) hypertension: Secondary | ICD-10-CM

## 2021-12-13 MED ORDER — TETANUS-DIPHTH-ACELL PERTUSSIS 5-2.5-18.5 LF-MCG/0.5 IM SUSP: 0.5000 mL | Freq: Once | INTRAMUSCULAR | 0 refills | Status: AC

## 2021-12-13 MED ORDER — PNEUMOCOCCAL 13-VAL CONJ VACC IM SUSP: 0.5000 mL | Freq: Once | INTRAMUSCULAR | 0 refills | Status: AC

## 2021-12-13 MED ORDER — ESZOPICLONE 2 MG PO TABS: 2.0000 mg | ORAL_TABLET | Freq: Every evening | ORAL | 5 refills | Status: DC | PRN

## 2021-12-13 NOTE — Patient Instructions (Addendum)
Covid 19 pfizer shot first then Tdap then prevnar  of Treatment - Building control surveyor Encounters Date Type Specialty Care Team Description  02/22/2022 Office Visit Endocrinology Honor Junes Justice Rocher, MD   [address]   [place], [place] [address]   [phone] (Work)   [phone] (Fax)       Eszopiclone Tablets What is this medication? ESZOPICLONE (es ZOE pi clone) treats insomnia. It helps you go to sleep faster and stay asleep through the night. It is often used for a short period of time. This medicine may be used for other purposes; ask your health care provider or pharmacist if you have questions. COMMON BRAND NAME(S): Lunesta What should I tell my care team before I take this medication? They need to know if you have any of these conditions: Depression Liver disease Lung or breathing disease, such as asthma or COPD Substance use disorder Sleep-walking, driving, eating or other activity while not fully awake after taking a sleep medication Suicidal thoughts, plans, or attempt by you or a family member An unusual or allergic reaction to eszopiclone, other medications, foods, dyes, or preservatives Pregnant or trying to get pregnant Breast-feeding How should I use this medication? Take this medication by mouth with a glass of water. Follow the directions on the prescription label. It is better to take this medication on an empty stomach and only when you are ready for bed. Do not take your medication more often than directed. If you have been taking this medication for several weeks and suddenly stop taking it, you may get unpleasant withdrawal symptoms. Your care team may want to gradually reduce the dose. Do not stop taking this medication on your own. Always follow your care team's advice. A special MedGuide will be given to you by the pharmacist with each prescription and refill. Be sure to read this information carefully each time. Talk to your care team about  the use of this medication in children. Special care may be needed. Overdosage: If you think you have taken too much of this medicine contact a poison control center or emergency room at once. NOTE: This medicine is only for you. Do not share this medicine with others. What if I miss a dose? This does not apply. This medication should only be taken immediately before going to sleep. Do not take double or extra doses. What may interact with this medication? Herbal medications like kava kava, melatonin, St. John's Wort and valerian Lorazepam Medications for fungal infections like ketoconazole, fluconazole, or itraconazole Olanzapine This list may not describe all possible interactions. Give your health care provider a list of all the medicines, herbs, non-prescription drugs, or dietary supplements you use. Also tell them if you smoke, drink alcohol, or use illegal drugs. Some items may interact with your medicine. What should I watch for while using this medication? Visit your care team for regular checks on your progress. Keep a regular sleep schedule by going to bed at about the same time nightly. Avoid caffeine-containing drinks in the evening hours, as caffeine can cause trouble with falling asleep. Talk to your care team if you still have trouble sleeping. You may do unusual sleep behaviors or activities you do not remember the day after taking this medication. Activities include driving, making or eating food, talking on the phone, sexual activity, or sleep walking. Stop taking this medication and call your care team right away if you find out you have done activities like this. Plan to go to bed and  stay in bed for a full night (7 to 8 hours) after you take this medication. You may still be drowsy the morning after taking this medication. This medication may affect your coordination, reaction time, or judgment. Do not drive or operate machinery until you know how this medication affects you. Sit  up or stand slowly to reduce the risk of dizzy or fainting spells. If you or your family notice any changes in your behavior, such as new or worsening depression, thoughts of harming yourself, anxiety, other unusual or disturbing thoughts, or memory loss, call your care team right away. After you stop taking this medication, you may have trouble falling asleep. This is called rebound insomnia. This problem usually goes away on its own after 1 or 2 nights. What side effects may I notice from receiving this medication? Side effects that you should report to your care team as soon as possible: Allergic reactions or angioedema--skin rash, itching, hives, swelling of the face, eyes, lips, tongue, arms, or legs, trouble swallowing or breathing CNS depression--slow or shallow breathing, shortness of breath, feeling faint, dizziness, confusion, trouble staying awake Mood and behavior changes--anxiety, nervousness, confusion, hallucinations, irritability, hostility, thoughts of suicide or self-harm, worsening mood, feelings of depression Unusual sleep behaviors or activities you do not remember such as driving, eating, or sexual activity Side effects that usually do not require medical attention (report to your care team if they continue or are bothersome): Dizziness Drowsiness the day after use Dry mouth Headache Metallic taste in mouth This list may not describe all possible side effects. Call your doctor for medical advice about side effects. You may report side effects to FDA at 1-800-FDA-1088. Where should I keep my medication? Keep out of the reach of children. This medication can be abused. Keep your medication in a safe place to protect it from theft. Do not share this medication with anyone. Selling or giving away this medication is dangerous and against the law. This medication may cause accidental overdose and death if taken by other adults, children, or pets. Mix any unused medication with a  substance like cat litter or coffee grounds. Then throw the medication away in a sealed container like a sealed bag or a coffee can with a lid. Do not use the medication after the expiration date. Store at room temperature between 15 and 30 degrees C (59 and 86 degrees F). NOTE: This sheet is a summary. It may not cover all possible information. If you have questions about this medicine, talk to your doctor, pharmacist, or health care provider.  2022 Elsevier/Gold Standard (2021-07-09 00:00:00)  Pneumococcal Conjugate Vaccine (Prevnar 13) Suspension for Injection What is this medication? PNEUMOCOCCAL VACCINE (NEU mo KOK al vak SEEN) is a vaccine used to prevent pneumococcus bacterial infections. These bacteria can cause serious infections like pneumonia, meningitis, and blood infections. This vaccine will lower your chance of getting pneumonia. If you do get pneumonia, it can make your symptoms milder and your illness shorter. This vaccine will not treat an infection and will not cause infection. This vaccine is recommended for infants and young children, adults with certain medical conditions, and adults 73 years or older. This medicine may be used for other purposes; ask your health care provider or pharmacist if you have questions. COMMON BRAND NAME(S): Prevnar, Prevnar 13 What should I tell my care team before I take this medication? They need to know if you have any of these conditions: bleeding problems fever immune system problems an unusual or  allergic reaction to pneumococcal vaccine, diphtheria toxoid, other vaccines, latex, other medicines, foods, dyes, or preservatives pregnant or trying to get pregnant breast-feeding How should I use this medication? This vaccine is for injection into a muscle. It is given by a health care professional. A copy of Vaccine Information Statements will be given before each vaccination. Read this sheet carefully each time. The sheet may change  frequently. Talk to your pediatrician regarding the use of this medicine in children. While this drug may be prescribed for children as young as 30 weeks old for selected conditions, precautions do apply. Overdosage: If you think you have taken too much of this medicine contact a poison control center or emergency room at once. NOTE: This medicine is only for you. Do not share this medicine with others. What if I miss a dose? It is important not to miss your dose. Call your doctor or health care professional if you are unable to keep an appointment. What may interact with this medication? medicines for cancer chemotherapy medicines that suppress your immune function steroid medicines like prednisone or cortisone This list may not describe all possible interactions. Give your health care provider a list of all the medicines, herbs, non-prescription drugs, or dietary supplements you use. Also tell them if you smoke, drink alcohol, or use illegal drugs. Some items may interact with your medicine. What should I watch for while using this medication? Mild fever and pain should go away in 3 days or less. Report any unusual symptoms to your doctor or health care professional. What side effects may I notice from receiving this medication? Side effects that you should report to your doctor or health care professional as soon as possible: allergic reactions like skin rash, itching or hives, swelling of the face, lips, or tongue breathing problems confused fast or irregular heartbeat fever over 102 degrees F seizures unusual bleeding or bruising unusual muscle weakness Side effects that usually do not require medical attention (report to your doctor or health care professional if they continue or are bothersome): aches and pains diarrhea fever of 102 degrees F or less headache irritable loss of appetite pain, tender at site where injected trouble sleeping This list may not describe all possible  side effects. Call your doctor for medical advice about side effects. You may report side effects to FDA at 1-800-FDA-1088. Where should I keep my medication? This does not apply. This vaccine is given in a clinic, pharmacy, doctor's office, or other health care setting and will not be stored at home. NOTE: This sheet is a summary. It may not cover all possible information. If you have questions about this medicine, talk to your doctor, pharmacist, or health care provider.  2022 Elsevier/Gold Standard (2014-09-08 00:00:00)  Tdap (Tetanus, Diphtheria, Pertussis) Vaccine: What You Need to Know 1. Why get vaccinated? Tdap vaccine can prevent tetanus, diphtheria, and pertussis. Diphtheria and pertussis spread from person to person. Tetanus enters the body through cuts or wounds. TETANUS (T) causes painful stiffening of the muscles. Tetanus can lead to serious health problems, including being unable to open the mouth, having trouble swallowing and breathing, or death. DIPHTHERIA (D) can lead to difficulty breathing, heart failure, paralysis, or death. PERTUSSIS (aP), also known as "whooping cough," can cause uncontrollable, violent coughing that makes it hard to breathe, eat, or drink. Pertussis can be extremely serious especially in babies and young children, causing pneumonia, convulsions, brain damage, or death. In teens and adults, it can cause weight loss, loss of  bladder control, passing out, and rib fractures from severe coughing. 2. Tdap vaccine Tdap is only for children 7 years and older, adolescents, and adults.  Adolescents should receive a single dose of Tdap, preferably at age 46 or 55 years. Pregnant people should get a dose of Tdap during every pregnancy, preferably during the early part of the third trimester, to help protect the newborn from pertussis. Infants are most at risk for severe, life-threatening complications from pertussis. Adults who have never received Tdap should get a  dose of Tdap. Also, adults should receive a booster dose of either Tdap or Td (a different vaccine that protects against tetanus and diphtheria but not pertussis) every 10 years, or after 5 years in the case of a severe or dirty wound or burn. Tdap may be given at the same time as other vaccines. 3. Talk with your health care provider Tell your vaccine provider if the person getting the vaccine: Has had an allergic reaction after a previous dose of any vaccine that protects against tetanus, diphtheria, or pertussis, or has any severe, life-threatening allergies Has had a coma, decreased level of consciousness, or prolonged seizures within 7 days after a previous dose of any pertussis vaccine (DTP, DTaP, or Tdap) Has seizures or another nervous system problem Has ever had Guillain-Barr Syndrome (also called "GBS") Has had severe pain or swelling after a previous dose of any vaccine that protects against tetanus or diphtheria In some cases, your health care provider may decide to postpone Tdap vaccination until a future visit. People with minor illnesses, such as a cold, may be vaccinated. People who are moderately or severely ill should usually wait until they recover before getting Tdap vaccine.  Your health care provider can give you more information. 4. Risks of a vaccine reaction Pain, redness, or swelling where the shot was given, mild fever, headache, feeling tired, and nausea, vomiting, diarrhea, or stomachache sometimes happen after Tdap vaccination. People sometimes faint after medical procedures, including vaccination. Tell your provider if you feel dizzy or have vision changes or ringing in the ears.  As with any medicine, there is a very remote chance of a vaccine causing a severe allergic reaction, other serious injury, or death. 5. What if there is a serious problem? An allergic reaction could occur after the vaccinated person leaves the clinic. If you see signs of a severe allergic  reaction (hives, swelling of the face and throat, difficulty breathing, a fast heartbeat, dizziness, or weakness), call 9-1-1 and get the person to the nearest hospital. For other signs that concern you, call your health care provider.  Adverse reactions should be reported to the Vaccine Adverse Event Reporting System (VAERS). Your health care provider will usually file this report, or you can do it yourself. Visit the VAERS website at www.vaers.SamedayNews.es or call 7577397140. VAERS is only for reporting reactions, and VAERS staff members do not give medical advice. 6. The National Vaccine Injury Compensation Program The [employer] Vaccine Injury Compensation Program (VICP) is a federal program that was created to compensate people who may have been injured by certain vaccines. Claims regarding alleged injury or death due to vaccination have a time limit for filing, which may be as short as two years. Visit the VICP website at GoldCloset.com.ee or call (925) 775-4488 to learn about the program and about filing a claim. 7. How can I learn more? Ask your health care provider. Call your local or state health department. Visit the website of the Food and Drug  Administration (FDA) for vaccine package inserts and additional information at TraderRating.uy. Contact the Centers for Disease Control and Prevention (CDC): Call (661) 308-4442 (1-800-CDC-INFO) or Visit CDC's website at http://hunter.com/. Vaccine Information Statement Tdap (Tetanus, Diphtheria, Pertussis) Vaccine (07/21/2020) This information is not intended to replace advice given to you by your health care provider. Make sure you discuss any questions you have with your health care provider. Document Revised: 08/16/2020 Document Reviewed: 08/16/2020 Elsevier Patient Education  2022 [employer].

## 2021-12-13 NOTE — Progress Notes (Signed)
Chief Complaint  Patient presents with   Annual Exam   Annual  1. Pancreas mass due for f/u mri disc with pt and radiology ok for contrast  2. Htn controlled on norvasc 5 mg and benicar 20-12.5 mg qd 1/2 pill  3. Chronic insomnia restoril not helping will try lunesta 4. Hyperthyroidism on methamizole 5 mg qd    Review of Systems  Constitutional:  Negative for weight loss.  HENT:  Negative for hearing loss.   Eyes:  Negative for blurred vision.  Respiratory:  Negative for shortness of breath.   Cardiovascular:  Negative for chest pain.  Gastrointestinal:  Negative for abdominal pain and blood in stool.  Genitourinary:  Negative for dysuria.  Musculoskeletal:  Negative for falls and joint pain.  Skin:  Negative for rash.  Neurological:  Negative for headaches.  Psychiatric/Behavioral:  Negative for depression.   Past Medical History:  Diagnosis Date   Anemia    iron deficiency and vitamin d deficiency   Anxiety    Bladder mass 11/2019   Breast mass 10/2019   bilateral, ? cysts   CKD (chronic kidney disease), stage III ([place])    COVID-19    01/03/21   Depression    Hypertension    Insomnia    Pre-diabetes    Sickle cell trait (HCC)    Thyroid goiter    Tubular adenoma of colon    Wears contact lenses    Wears dentures    upper and lower partials, also permanent retainer   Past Surgical History:  Procedure Laterality Date   ABDOMINAL HYSTERECTOMY     for prolapse per pt with mesh ovaries still intact no h/o abnormal pap   COLONOSCOPY WITH PROPOFOL N/A 06/10/2019   Procedure: COLONOSCOPY WITH PROPOFOL;  Surgeon: Lucilla Lame, MD;  Location: [place];  Service: Endoscopy;  Laterality: N/A;   CYSTOSCOPY WITH BIOPSY N/A 12/03/2019   Procedure: CYSTOSCOPY WITH bladder BIOPSY;  Surgeon: Billey Co, MD;  Location: [facility];  Service: Urology;  Laterality: N/A;   CYSTOSCOPY WITH FULGERATION N/A 12/03/2019   Procedure: CYSTOSCOPY WITH FULGERATION;  Surgeon:  Billey Co, MD;  Location: [facility];  Service: Urology;  Laterality: N/A;   ESOPHAGOGASTRODUODENOSCOPY (EGD) WITH PROPOFOL N/A 06/10/2019   Procedure: ESOPHAGOGASTRODUODENOSCOPY (EGD) WITH PROPOFOL;  Surgeon: Lucilla Lame, MD;  Location: [place];  Service: Endoscopy;  Laterality: N/A;   EYE SURGERY Bilateral    pterygium, both sides repaired   GIVENS CAPSULE STUDY N/A 08/24/2019   Procedure: GIVENS CAPSULE STUDY;  Surgeon: Lucilla Lame, MD;  Location: [facility];  Service: Endoscopy;  Laterality: N/A;   POLYPECTOMY  06/10/2019   Procedure: POLYPECTOMY;  Surgeon: Lucilla Lame, MD;  Location: [place];  Service: Endoscopy;;   Family History  Problem Relation Age of Onset   Breast cancer Mother 89   Diabetes Mother    Kidney failure Father    Alcohol abuse Father    Heart disease Father        chf   Hyperlipidemia Father    Other Other        fatty liver cousin   Sickle cell anemia Other        nephew   Cancer Other        aunt with ovarian cancer   Lung cancer Maternal Aunt    Autism Grandson    Social History   Socioeconomic History   Marital status: Divorced    Spouse name: Not on file  Number of children: Not on file   Years of education: Not on file   Highest education level: Not on file  Occupational History   Not on file  Tobacco Use   Smoking status: Never   Smokeless tobacco: Never  Vaping Use   Vaping Use: Never used  Substance and Sexual Activity   Alcohol use: Never   Drug use: Never   Sexual activity: Yes    Birth control/protection: Surgical    Comment: Hysterectomy  Other Topics Concern   Not on file  Social History Narrative   [employer] employee   2 kids daughter Corky Sing and son Llesuer Marcello Moores   Works homeless shelter weekends    Works [employer] in [place] as of 04/2020   Social Determinants of Health   Financial Resource Strain: Not on file  Food Insecurity: Not on file  Transportation Needs: Not on file   Physical Activity: Not on file  Stress: Not on file  Social Connections: Not on file  Intimate Partner Violence: Not on file   Current Meds  Medication Sig   azelastine (ASTELIN) 0.1 % nasal spray Place 2 sprays into both nostrils 2 (two) times daily. Use in each nostril as directed prn   Blood Pressure KIT 1 Device by Does not apply route daily. BP cuff adult large size   Cholecalciferol (VITAMIN D) 50 MCG (2000 UT) CAPS Take 2,000 Units by mouth every 7 (seven) days.   eszopiclone (LUNESTA) 2 MG TABS tablet Take 1 tablet (2 mg total) by mouth at bedtime as needed for sleep. Take immediately before bedtime   fluticasone (FLONASE) 50 MCG/ACT nasal spray Place 2 sprays into both nostrils daily.   methimazole (TAPAZOLE) 5 MG tablet Take 5 mg by mouth daily.   montelukast (SINGULAIR) 10 MG tablet Take 1 tablet (10 mg total) by mouth at bedtime.   omeprazole (PRILOSEC) 20 MG capsule Take 1 capsule by mouth daily.   [EXPIRED] pneumococcal 13-valent conjugate vaccine (PREVNAR 13) SUSP injection Inject 0.5 mLs into the muscle once for 1 dose.   [EXPIRED] Tdap (BOOSTRIX) 5-2.5-18.5 LF-MCG/0.5 injection Inject 0.5 mLs into the muscle once for 1 dose.   triamcinolone cream (KENALOG) 0.1 % Apply 1 application topically 2 (two) times daily as needed.   [DISCONTINUED] amLODipine (NORVASC) 5 MG tablet Take 1 tablet (5 mg total) by mouth daily.   [DISCONTINUED] olmesartan-hydrochlorothiazide (BENICAR HCT) 20-12.5 MG tablet TAKE 1/2 TABLET BY MOUTH DAILY IN THE MORNING   [DISCONTINUED] temazepam (RESTORIL) 30 MG capsule Take 1 capsule (30 mg total) by mouth at bedtime as needed for sleep.   Allergies  Allergen Reactions   Sulfa Antibiotics Itching, Swelling and Other (See Comments)   Wheat Bran Other (See Comments)    bloating   Belsomra [Suvorexant]     Nightmares    Melatonin     Night mares     Ambien [Zolpidem Tartrate] Other (See Comments)    Nightmares    Aspirin Other (See Comments)     Gastric irritation   Prednisone Palpitations   Shellfish Allergy Itching   Strawberry Extract Itching   No results found for this or any previous visit (from the past 2160 hour(s)). Objective  Body mass index is 31.28 kg/m. Wt Readings from Last 3 Encounters:  12/13/21 193 lb 12.8 oz (87.9 kg)  06/13/21 185 lb 6.4 oz (84.1 kg)  05/09/21 185 lb (83.9 kg)   Temp Readings from Last 3 Encounters:  12/13/21 98.1 F (36.7 C) (Oral)  06/13/21 98.6 F (37 C) (Oral)  02/22/21 (!) 97 F (36.1 C) (Temporal)   BP Readings from Last 3 Encounters:  12/13/21 124/82  06/13/21 140/90  05/09/21 (!) 144/100   Pulse Readings from Last 3 Encounters:  12/13/21 95  06/13/21 83  02/22/21 86    Physical Exam Vitals and nursing note reviewed.  Constitutional:      Appearance: Normal appearance. She is well-developed and well-groomed.  HENT:     Head: Normocephalic and atraumatic.  Eyes:     Conjunctiva/sclera: Conjunctivae normal.     Pupils: Pupils are equal, round, and reactive to light.  Cardiovascular:     Rate and Rhythm: Normal rate and regular rhythm.     Heart sounds: Normal heart sounds. No murmur heard. Pulmonary:     Effort: Pulmonary effort is normal.     Breath sounds: Normal breath sounds.  Abdominal:     General: Abdomen is flat. Bowel sounds are normal.     Tenderness: There is no abdominal tenderness.  Musculoskeletal:        General: No tenderness.  Skin:    General: Skin is warm and dry.  Neurological:     General: No focal deficit present.     Mental Status: She is alert and oriented to person, place, and time. Mental status is at baseline.     Cranial Nerves: Cranial nerves 2-12 are intact.     Gait: Gait is intact.  Psychiatric:        Attention and Perception: Attention and perception normal.        Mood and Affect: Mood and affect normal.        Speech: Speech normal.        Behavior: Behavior normal. Behavior is cooperative.        Thought Content:  Thought content normal.        Cognition and Memory: Cognition and memory normal.        Judgment: Judgment normal.    Assessment  Plan  Annual physical exam See below   Insomnia, unspecified type - Plan: eszopiclone (LUNESTA) 2 MG TABS tablet Restoril 30 mg qd not effective   Breast cyst, right - Plan: US BREAST LTD UNI RIGHT INC AXILLA, MM DIAG BREAST TOMO UNI RIGHT 6 months f/u   Mass of pancreas MRI abdomen f/u ordere d  Essential hypertension controlled - Plan: olmesartan-hydrochlorothiazide (BENICAR HCT) 20-12.5 MG tablet 1/2 pill, amLODipine (NORVASC) 5 MG tablet  Hyperlipidemia, unspecified hyperlipidemia type - Plan: ezetimibe (ZETIA) 10 MG tablet   HM Flu utd 2022 Rx prevnar and Tdap  covid 19 3/3 pfizer had 01/03/21 +covid 19 better as of 01/31/21  consider booster 4th dose   Consider shingrix, pna 23 in future  Hep C negative   Breast exam normal today 06/13/21 mammo h/o right breast cyst benign and cluster of cysts left breast  --> mammo due 08/28/20 negative ordered for 2022  09/17/21 resch right dx mammo and [place] 03/18/2022   S/p hysterectomy no h/o abnormal pap, ovaries intact consider pelvic in future doing pelvic MRI see above    Dexa 08/29/21 normal    colonoscopy Dr. Allen Norris 06/10/19/egd chronic gastritis, tubular adenoma f/u in 5 years    rec healthy diet and exercise  MRI abdomen 11/21/20  IMPRESSION: 1. Pancreatic uncinate process minimally septated cystic lesion without suspicious characteristics. Considerations given morphology include pseudocyst or indolent cystic neoplasm such as serous cystadenoma. Given mild enlargement since 2014, consider surveillance with pre and post  contrast abdominal MRI at 1 year. This recommendation follows ACR consensus guidelines: Management of Incidental Pancreatic Cysts: A White Paper of the ACR Incidental Findings Committee. J Am Coll Radiol 5974;16:384-536. 2. Bilateral renal lesions which are likely cysts. An upper  pole lesion is consistent with a Bosniak 2 minimally complex cyst. 3. Cholelithiasis. 4.  Aortic Atherosclerosis (ICD10-I70.0).     Electronically Signed   By: Abigail Miyamoto M.D.   On: 11/21/2020 14:02   Consider CT/MRI abdomen pancreatic cyst uncinate 1.9 from 1.4 cm likely benign to ensure stability 11/21/20  CT 11/22/19 hematuria w/o  IMPRESSION: 1. No focal bladder wall mass lesion. Patient is noted to have fairly marked pelvic floor laxity with associated cystocele. The posterior bladder is draped across the apparent urethral origin creating a "masslike bulge" on the inferior bladder, likely accounting for the appearance on ultrasound. As urethral and pelvic floor soft tissues are not well evaluated by CT, pelvic MRI without and with contrast could be used to better assess urethral and pelvic floor anatomy and exclude underlying urethral/pelvic floor mass lesion, as warranted. 2. 19 mm cyst in the uncinate process of the pancreas has a multicystic configuration is increased only minimally in size since 09/04/2013, suggesting benign etiology. Given the slight progression, repeat CT or MRI using pancreatic protocol in 12 months may be warranted to ensure stability. 3. Bilateral renal cysts with tiny low-density lesions in the left kidney, too small to characterize. -->11/21/20 ordered MRI abdomen w and w/o f/u above   -->Dr. Honor Junes 10/05/19 US thyroid MNG and nodules and hyperthyroidism -->appt to f/u 02/03/20 Dr. Sharrie Rothman endocrine f/u 04/04/20 neg bx -CC Dr. Ladell Pier as of 10/31/20 ? If he is going to order thyroid US repeat 2021 and TSH 10/23/20 <0.010 s/p RAI pt states did not hear re what to do next with results she c/o fatigue and reduced sleep  Seen 01/22/21 [facility] endocrine and f/u 05/25/21 with repeat US F/u Q 3 months [facility] as of 11/2021   IMPRESSION: Enlarging TI-RADS category 3 nodule occupying the left mid and inferior gland now measuring up to 5.9 cm compared  to 5.1 cm previously. This lesion continues to meet criteria for consideration of fine-needle aspiration biopsy. -see above)   f/u 07/10/20 [facility] endocrine    rec healthy diet and exercise    Renal Dr. Holley Raring   Provider: Dr. Olivia Mackie McLean-Scocuzza-Internal Medicine

## 2021-12-14 MED ORDER — OLMESARTAN MEDOXOMIL-HCTZ 20-12.5 MG PO TABS: ORAL_TABLET | ORAL | 3 refills | Status: DC

## 2021-12-14 MED ORDER — AMLODIPINE BESYLATE 5 MG PO TABS: 5.0000 mg | ORAL_TABLET | Freq: Every day | ORAL | 3 refills | Status: DC

## 2021-12-14 MED ORDER — EZETIMIBE 10 MG PO TABS: 10.0000 mg | ORAL_TABLET | Freq: Every day | ORAL | 3 refills | Status: DC

## 2021-12-28 ENCOUNTER — Ambulatory Visit
Admission: RE | Admit: 2021-12-28 | Discharge: 2021-12-28 | Disposition: A | Discharge status: Home / Self Care | Payer: Federal, State, Local not specified - PPO | Source: Ambulatory Visit | Attending: Internal Medicine | Admitting: Internal Medicine

## 2021-12-28 DIAGNOSIS — K7689 Other specified diseases of liver: Secondary | ICD-10-CM | POA: Diagnosis not present

## 2021-12-28 DIAGNOSIS — K8689 Other specified diseases of pancreas: Secondary | ICD-10-CM | POA: Insufficient documentation

## 2021-12-28 DIAGNOSIS — N281 Cyst of kidney, acquired: Secondary | ICD-10-CM | POA: Diagnosis not present

## 2021-12-28 DIAGNOSIS — K8020 Calculus of gallbladder without cholecystitis without obstruction: Secondary | ICD-10-CM | POA: Diagnosis not present

## 2021-12-28 DIAGNOSIS — K862 Cyst of pancreas: Secondary | ICD-10-CM | POA: Diagnosis not present

## 2021-12-28 MED ORDER — GADOBUTROL 1 MMOL/ML IV SOLN
9.0000 mL | Freq: Once | INTRAVENOUS | Status: AC | PRN
  Administered 2021-12-28: 9 mL via INTRAVENOUS

## 2022-02-11 DIAGNOSIS — I10 Essential (primary) hypertension: Secondary | ICD-10-CM | POA: Diagnosis not present

## 2022-02-11 DIAGNOSIS — N2581 Secondary hyperparathyroidism of renal origin: Secondary | ICD-10-CM | POA: Diagnosis not present

## 2022-02-11 DIAGNOSIS — N1831 Chronic kidney disease, stage 3a: Secondary | ICD-10-CM | POA: Diagnosis not present

## 2022-02-11 DIAGNOSIS — R809 Proteinuria, unspecified: Secondary | ICD-10-CM | POA: Diagnosis not present

## 2022-02-14 ENCOUNTER — Other Ambulatory Visit: Payer: Self-pay | Admitting: Internal Medicine

## 2022-02-14 DIAGNOSIS — J0110 Acute frontal sinusitis, unspecified: Secondary | ICD-10-CM

## 2022-02-14 NOTE — Telephone Encounter (Signed)
Pt need a refill on flonase and sleeping medicine sent to [facility] ?

## 2022-02-22 DIAGNOSIS — E0590 Thyrotoxicosis, unspecified without thyrotoxic crisis or storm: Secondary | ICD-10-CM | POA: Diagnosis not present

## 2022-02-22 DIAGNOSIS — E042 Nontoxic multinodular goiter: Secondary | ICD-10-CM | POA: Diagnosis not present

## 2022-03-19 ENCOUNTER — Other Ambulatory Visit: Payer: Federal, State, Local not specified - PPO

## 2022-04-01 ENCOUNTER — Ambulatory Visit
Admission: RE | Admit: 2022-04-01 | Discharge: 2022-04-01 | Disposition: A | Discharge status: Home / Self Care | Payer: Federal, State, Local not specified - PPO | Source: Ambulatory Visit | Attending: Internal Medicine | Admitting: Internal Medicine

## 2022-04-01 ENCOUNTER — Other Ambulatory Visit: Payer: Self-pay | Admitting: Internal Medicine

## 2022-04-01 DIAGNOSIS — N6001 Solitary cyst of right breast: Secondary | ICD-10-CM

## 2022-04-01 DIAGNOSIS — Z1231 Encounter for screening mammogram for malignant neoplasm of breast: Secondary | ICD-10-CM

## 2022-04-01 DIAGNOSIS — R922 Inconclusive mammogram: Secondary | ICD-10-CM | POA: Diagnosis not present

## 2022-05-22 DIAGNOSIS — E0590 Thyrotoxicosis, unspecified without thyrotoxic crisis or storm: Secondary | ICD-10-CM | POA: Diagnosis not present

## 2022-05-22 DIAGNOSIS — M542 Cervicalgia: Secondary | ICD-10-CM | POA: Diagnosis not present

## 2022-05-22 DIAGNOSIS — E042 Nontoxic multinodular goiter: Secondary | ICD-10-CM | POA: Diagnosis not present

## 2022-07-10 DIAGNOSIS — H109 Unspecified conjunctivitis: Secondary | ICD-10-CM | POA: Diagnosis not present

## 2022-07-10 DIAGNOSIS — E042 Nontoxic multinodular goiter: Secondary | ICD-10-CM | POA: Diagnosis not present

## 2022-07-16 DIAGNOSIS — N1831 Chronic kidney disease, stage 3a: Secondary | ICD-10-CM | POA: Diagnosis not present

## 2022-07-16 DIAGNOSIS — D631 Anemia in chronic kidney disease: Secondary | ICD-10-CM | POA: Diagnosis not present

## 2022-07-16 DIAGNOSIS — R809 Proteinuria, unspecified: Secondary | ICD-10-CM | POA: Diagnosis not present

## 2022-07-16 DIAGNOSIS — I10 Essential (primary) hypertension: Secondary | ICD-10-CM | POA: Diagnosis not present

## 2022-07-22 DIAGNOSIS — E0590 Thyrotoxicosis, unspecified without thyrotoxic crisis or storm: Secondary | ICD-10-CM | POA: Diagnosis not present

## 2022-08-06 ENCOUNTER — Other Ambulatory Visit: Payer: Self-pay | Admitting: Internal Medicine

## 2022-08-06 DIAGNOSIS — I10 Essential (primary) hypertension: Secondary | ICD-10-CM

## 2022-08-12 DIAGNOSIS — H109 Unspecified conjunctivitis: Secondary | ICD-10-CM | POA: Diagnosis not present

## 2022-08-22 DIAGNOSIS — H04123 Dry eye syndrome of bilateral lacrimal glands: Secondary | ICD-10-CM | POA: Diagnosis not present

## 2022-08-30 ENCOUNTER — Ambulatory Visit
Admission: RE | Admit: 2022-08-30 | Discharge: 2022-08-30 | Disposition: A | Discharge status: Home / Self Care | Payer: Federal, State, Local not specified - PPO | Source: Ambulatory Visit | Attending: Internal Medicine | Admitting: Internal Medicine

## 2022-08-30 ENCOUNTER — Other Ambulatory Visit: Payer: Self-pay | Admitting: Internal Medicine

## 2022-08-30 DIAGNOSIS — Z1231 Encounter for screening mammogram for malignant neoplasm of breast: Secondary | ICD-10-CM | POA: Diagnosis not present

## 2022-08-30 DIAGNOSIS — J309 Allergic rhinitis, unspecified: Secondary | ICD-10-CM

## 2022-09-11 DIAGNOSIS — H524 Presbyopia: Secondary | ICD-10-CM | POA: Diagnosis not present

## 2022-10-23 DIAGNOSIS — E0590 Thyrotoxicosis, unspecified without thyrotoxic crisis or storm: Secondary | ICD-10-CM | POA: Diagnosis not present

## 2022-10-23 DIAGNOSIS — E042 Nontoxic multinodular goiter: Secondary | ICD-10-CM | POA: Diagnosis not present

## 2022-12-25 DIAGNOSIS — R809 Proteinuria, unspecified: Secondary | ICD-10-CM | POA: Diagnosis not present

## 2022-12-25 DIAGNOSIS — N1832 Chronic kidney disease, stage 3b: Secondary | ICD-10-CM | POA: Diagnosis not present

## 2022-12-25 DIAGNOSIS — I10 Essential (primary) hypertension: Secondary | ICD-10-CM | POA: Diagnosis not present

## 2022-12-25 DIAGNOSIS — N2581 Secondary hyperparathyroidism of renal origin: Secondary | ICD-10-CM | POA: Diagnosis not present

## 2023-02-04 ENCOUNTER — Other Ambulatory Visit: Payer: Self-pay

## 2023-02-04 ENCOUNTER — Ambulatory Visit
Admission: EM | Admit: 2023-02-04 | Discharge: 2023-02-04 | Disposition: A | Discharge status: Home / Self Care | Payer: Federal, State, Local not specified - PPO | Attending: Emergency Medicine | Admitting: Emergency Medicine

## 2023-02-04 DIAGNOSIS — U071 COVID-19: Secondary | ICD-10-CM | POA: Insufficient documentation

## 2023-02-04 LAB — SARS CORONAVIRUS 2 BY RT PCR: SARS Coronavirus 2 by RT PCR: POSITIVE — AB

## 2023-02-04 MED ORDER — BENZONATATE 100 MG PO CAPS: 100.0000 mg | ORAL_CAPSULE | Freq: Three times a day (TID) | ORAL | 0 refills | Status: DC

## 2023-02-04 MED ORDER — PROMETHAZINE-DM 6.25-15 MG/5ML PO SYRP: 5.0000 mL | ORAL_SOLUTION | Freq: Four times a day (QID) | ORAL | 0 refills | Status: DC | PRN

## 2023-02-04 MED ORDER — PAXLOVID (150/100) 10 X 150 MG & 10 X 100MG PO TBPK: 2.0000 | ORAL_TABLET | Freq: Two times a day (BID) | ORAL | 0 refills | Status: AC

## 2023-02-04 NOTE — ED Provider Notes (Signed)
[facility]    CSN: [id number] Arrival date & time: 02/04/23  0802      History   Chief Complaint Chief Complaint  Patient presents with   Headache   Nasal Congestion   Cough    HPI Kendra Rhodes is a 67 y.o. female.   Patient presents for evaluation of fever, nasal congestion, rhinorrhea, postnasal drip, sore throat, cough and headaches present for 4 days.  Fever subjective.  Cough is productive but denies shortness of breath and wheezing.  Sore throat has resolved.  Known sick contact within household.  Tolerating food and liquids.  Has attempted use of NyQuil and Tylenol for management.   Past Medical History:  Diagnosis Date   Anemia    iron deficiency and vitamin d deficiency   Anxiety    Bladder mass 11/2019   Breast mass 10/2019   bilateral, ? cysts   CKD (chronic kidney disease), stage III ([place])    COVID-19    01/03/21   Depression    Hypertension    Insomnia    Pre-diabetes    Sickle cell trait (HCC)    Thyroid goiter    Tubular adenoma of colon    Wears contact lenses    Wears dentures    upper and lower partials, also permanent retainer    Patient Active Problem List   Diagnosis Date Noted   Cough 05/09/2021   Snoring 02/22/2021   Hiatal hernia 11/27/2020   Liver cyst 11/27/2020   Cardiomegaly 11/27/2020   Aortic atherosclerosis ([place]) 11/27/2020   Prediabetes 11/07/2020   Obesity (BMI 30-39.9) 05/02/2020   Stage 2 chronic kidney disease 05/02/2020   Gallstones 01/17/2020   Kidney cysts 01/14/2020   Pancreas cyst 01/14/2020   Annual physical exam 10/14/2019   Chronic kidney disease (CKD), stage III (moderate) (HCC) 10/14/2019   Bilateral breast cysts 10/14/2019   Thyroid nodule 09/17/2019   Vitamin D deficiency 09/13/2019   Abnormal thyroid function test 09/13/2019   Hematuria 09/13/2019   Atrophic vaginitis 08/11/2019   Elevated alkaline phosphatase level 08/11/2019   Chronic insomnia 08/11/2019   Family history of breast  cancer 08/11/2019   Chronic constipation 08/11/2019   Anxiety and depression    Multinodular goiter 08/10/2019   Essential hypertension 08/10/2019   HLD (hyperlipidemia) 08/10/2019   Iron deficiency anemia due to chronic blood loss    Polyp of sigmoid colon    Acute gastritis without hemorrhage    Bursitis of shoulder 02/24/2019   Shoulder joint pain 02/24/2019   Age-related nuclear cataract of both eyes 09/23/2018   Irregular astigmatism of both eyes 09/23/2018   Progressive peripheral pterygium of both eyes 09/23/2018    Past Surgical History:  Procedure Laterality Date   ABDOMINAL HYSTERECTOMY     for prolapse per pt with mesh ovaries still intact no h/o abnormal pap   COLONOSCOPY WITH PROPOFOL N/A 06/10/2019   Procedure: COLONOSCOPY WITH PROPOFOL;  Surgeon: Lucilla Lame, MD;  Location: [place];  Service: Endoscopy;  Laterality: N/A;   CYSTOSCOPY WITH BIOPSY N/A 12/03/2019   Procedure: CYSTOSCOPY WITH bladder BIOPSY;  Surgeon: Billey Co, MD;  Location: [facility];  Service: Urology;  Laterality: N/A;   CYSTOSCOPY WITH FULGERATION N/A 12/03/2019   Procedure: CYSTOSCOPY WITH FULGERATION;  Surgeon: Billey Co, MD;  Location: [facility];  Service: Urology;  Laterality: N/A;   ESOPHAGOGASTRODUODENOSCOPY (EGD) WITH PROPOFOL N/A 06/10/2019   Procedure: ESOPHAGOGASTRODUODENOSCOPY (EGD) WITH PROPOFOL;  Surgeon: Lucilla Lame, MD;  Location: [facility];  Service: Endoscopy;  Laterality: N/A;   EYE SURGERY Bilateral    pterygium, both sides repaired   GIVENS CAPSULE STUDY N/A 08/24/2019   Procedure: GIVENS CAPSULE STUDY;  Surgeon: Lucilla Lame, MD;  Location: [facility];  Service: Endoscopy;  Laterality: N/A;   POLYPECTOMY  06/10/2019   Procedure: POLYPECTOMY;  Surgeon: Lucilla Lame, MD;  Location: [place];  Service: Endoscopy;;    OB History   No obstetric history on file.      Home Medications    Prior to Admission medications    Medication Sig Start Date End Date Taking? Authorizing Provider  amLODipine (NORVASC) 5 MG tablet Take 1 tablet (5 mg total) by mouth daily. 12/14/21   McLean-Scocuzza, Nino Glow, MD  atenolol (TENORMIN) 50 MG tablet     [provider]  azelastine (ASTELIN) 0.1 % nasal spray Place 2 sprays into both nostrils 2 (two) times daily. Use in each nostril as directed prn 10/31/20   McLean-Scocuzza, Nino Glow, MD  Blood Pressure KIT 1 Device by Does not apply route daily. BP cuff adult large size 10/31/20   McLean-Scocuzza, Nino Glow, MD  Cholecalciferol (VITAMIN D) 50 MCG (2000 UT) CAPS Take 2,000 Units by mouth every 7 (seven) days.    [provider]  eszopiclone (LUNESTA) 2 MG TABS tablet Take 1 tablet (2 mg total) by mouth at bedtime as needed for sleep. Take immediately before bedtime 12/13/21   McLean-Scocuzza, Nino Glow, MD  ezetimibe (ZETIA) 10 MG tablet Take 1 tablet (10 mg total) by mouth daily. 12/14/21   McLean-Scocuzza, Nino Glow, MD  fluticasone (FLONASE) 50 MCG/ACT nasal spray SHAKE LIQUID AND USE 2 SPRAYS IN EACH NOSTRIL DAILY 02/18/22   McLean-Scocuzza, Nino Glow, MD  methimazole (TAPAZOLE) 5 MG tablet Take 5 mg by mouth daily. 11/21/21   [provider]  montelukast (SINGULAIR) 10 MG tablet TAKE 1 TABLET(10 MG) BY MOUTH AT BEDTIME 08/30/22   McLean-Scocuzza, Nino Glow, MD  olmesartan-hydrochlorothiazide (BENICAR HCT) 20-12.5 MG tablet TAKE 1 TABLET BY MOUTH DAILY IN THE MORNING 08/06/22   Dutch Quint B, FNP  omeprazole (PRILOSEC) 20 MG capsule Take 1 capsule by mouth daily. 07/19/21 07/19/22  [provider]  triamcinolone cream (KENALOG) 0.1 % Apply 1 application topically 2 (two) times daily as needed. 05/02/20   McLean-Scocuzza, Nino Glow, MD    Family History Family History  Problem Relation Age of Onset   Breast cancer Mother 61   Diabetes Mother    Kidney failure Father    Alcohol abuse Father    Heart disease Father        chf   Hyperlipidemia Father    Lung  cancer Maternal Aunt    Autism Grandson    Other Other        fatty liver cousin   Sickle cell anemia Other        nephew   Cancer Other        aunt with ovarian cancer    Social History Social History   Tobacco Use   Smoking status: Never   Smokeless tobacco: Never  Vaping Use   Vaping Use: Never used  Substance Use Topics   Alcohol use: Never   Drug use: Never     Allergies   Sulfa antibiotics, Wheat bran, Belsomra [suvorexant], Melatonin, Ambien [zolpidem tartrate], Aspirin, Prednisone, Shellfish allergy, and Strawberry extract   Review of Systems Review of Systems  Constitutional:  Positive for fever. Negative for activity change, appetite  change, chills, diaphoresis, fatigue and unexpected weight change.  HENT:  Positive for congestion, postnasal drip, rhinorrhea and sore throat. Negative for dental problem, drooling, ear discharge, ear pain, facial swelling, hearing loss, mouth sores, nosebleeds, sinus pressure, sinus pain, sneezing, tinnitus, trouble swallowing and voice change.   Respiratory:  Positive for cough. Negative for apnea, choking, chest tightness, shortness of breath, wheezing and stridor.   Cardiovascular: Negative.   Gastrointestinal: Negative.   Neurological:  Positive for headaches. Negative for dizziness, tremors, seizures, syncope, facial asymmetry, speech difficulty, weakness, light-headedness and numbness.     Physical Exam Triage Vital Signs ED Triage Vitals  Enc Vitals Group     BP 02/04/23 0827 128/85     Pulse Rate 02/04/23 0827 (!) 102     Resp 02/04/23 0827 17     Temp 02/04/23 0827 99.3 F (37.4 C)     Temp Source 02/04/23 0827 Oral     SpO2 02/04/23 0827 96 %     Weight 02/04/23 0823 184 lb (83.5 kg)     Height 02/04/23 0823 5' 6"$  (1.676 m)     Head Circumference --      Peak Flow --      Pain Score 02/04/23 0823 4     Pain Loc --      Pain Edu? --      Excl. in [place]? --    No data found.  Updated Vital Signs BP 128/85 (BP  Location: Right Arm)   Pulse (!) 102   Temp 99.3 F (37.4 C) (Oral)   Resp 17   Ht 5' 6"$  (1.676 m)   Wt 184 lb (83.5 kg)   SpO2 96%   BMI 29.70 kg/m   Visual Acuity Right Eye Distance:   Left Eye Distance:   Bilateral Distance:    Right Eye Near:   Left Eye Near:    Bilateral Near:     Physical Exam Constitutional:      Appearance: Normal appearance.  HENT:     Head: Normocephalic.     Right Ear: Tympanic membrane, ear canal and external ear normal.     Left Ear: Tympanic membrane, ear canal and external ear normal.     Nose: Congestion and rhinorrhea present.     Mouth/Throat:     Mouth: Mucous membranes are moist.     Pharynx: No posterior oropharyngeal erythema.  Eyes:     Extraocular Movements: Extraocular movements intact.  Cardiovascular:     Rate and Rhythm: Normal rate and regular rhythm.     Pulses: Normal pulses.     Heart sounds: Normal heart sounds.  Pulmonary:     Effort: Pulmonary effort is normal.     Breath sounds: Normal breath sounds.  Skin:    General: Skin is dry.  Neurological:     Mental Status: She is alert and oriented to person, place, and time. Mental status is at baseline.      UC Treatments / Results  Labs (all labs ordered are listed, but only abnormal results are displayed) Labs Reviewed  SARS CORONAVIRUS 2 BY RT PCR - Abnormal; Notable for the following components:      Result Value   SARS Coronavirus 2 by RT PCR POSITIVE (*)    All other components within normal limits    EKG   Radiology No results found.  Procedures Procedures (including critical care time)  Medications Ordered in UC Medications - No data to display  Initial Impression / Assessment and  Plan / UC Course  I have reviewed the triage vital signs and the nursing notes.  Pertinent labs & imaging results that were available during my care of the patient were reviewed by me and considered in my medical decision making (see chart for  details).  COVID-19  Patient is in no signs of distress nor toxic appearing.  Vital signs are stable.  Low suspicion for pneumonia, pneumothorax or bronchitis and therefore will defer imaging.  Confirmed by PCR, discussed findings with the patient, discussed quarantine guidelines and work note given.  Paxlovid prescribed, GFR 41, history of CKD.  Additionally prescribed Tessalon and Promethazine DM as cough is most bothersome symptom.May use additional over-the-counter medications as needed for supportive care.  May follow-up with urgent care as needed if symptoms persist or worsen.   Final Clinical Impressions(s) / UC Diagnoses   Final diagnoses:  None   Discharge Instructions   None    ED Prescriptions   None    PDMP not reviewed this encounter.   Hans [place], [place] 02/04/23 [phone]

## 2023-02-04 NOTE — ED Triage Notes (Signed)
4 days of cough, nasal congestion, headache and subjective fever

## 2023-02-04 NOTE — Discharge Instructions (Signed)
Testing positive for COVID-19 which is a virus and should improve with time  Begin Paxlovid taking every morning and every evening for 5 days, this medicine helps to reduce the amount of virus in your body which will help to minimize your symptoms, does not fully take away illness  Current CDC guidelines are 5-day quarantine therefore you may return activity on Thursday, February 06, 2023, if still having symptoms at that time please wear mask for an additional 5 days  You may use Tessalon pill taking 1 to 2 capsules every 8 hours as needed for coughing  You may use cough syrup every 6 hours as needed for additional comfort, be mindful this medication may make you feel drowsy    You can take Tylenol and/or Ibuprofen as needed for fever reduction and pain relief.   For cough: honey 1/2 to 1 teaspoon (you can dilute the honey in water or another fluid).  You can also use guaifenesin and dextromethorphan for cough. You can use a humidifier for chest congestion and cough.  If you don't have a humidifier, you can sit in the bathroom with the hot shower running.      For sore throat: try warm salt water gargles, cepacol lozenges, throat spray, warm tea or water with lemon/honey, popsicles or ice, or OTC cold relief medicine for throat discomfort.   For congestion: take a daily anti-histamine like Zyrtec, Claritin, and a oral decongestant, such as pseudoephedrine.  You can also use Flonase 1-2 sprays in each nostril daily.   It is important to stay hydrated: drink plenty of fluids (water, gatorade/powerade/pedialyte, juices, or teas) to keep your throat moisturized and help further relieve irritation/discomfort.

## 2023-03-05 DIAGNOSIS — E042 Nontoxic multinodular goiter: Secondary | ICD-10-CM | POA: Diagnosis not present

## 2023-03-05 DIAGNOSIS — E0590 Thyrotoxicosis, unspecified without thyrotoxic crisis or storm: Secondary | ICD-10-CM | POA: Diagnosis not present

## 2023-03-05 DIAGNOSIS — R5383 Other fatigue: Secondary | ICD-10-CM | POA: Diagnosis not present

## 2023-04-30 DIAGNOSIS — I10 Essential (primary) hypertension: Secondary | ICD-10-CM | POA: Diagnosis not present

## 2023-04-30 DIAGNOSIS — N2581 Secondary hyperparathyroidism of renal origin: Secondary | ICD-10-CM | POA: Diagnosis not present

## 2023-04-30 DIAGNOSIS — R809 Proteinuria, unspecified: Secondary | ICD-10-CM | POA: Diagnosis not present

## 2023-04-30 DIAGNOSIS — N1832 Chronic kidney disease, stage 3b: Secondary | ICD-10-CM | POA: Diagnosis not present

## 2023-04-30 DIAGNOSIS — D631 Anemia in chronic kidney disease: Secondary | ICD-10-CM | POA: Diagnosis not present

## 2023-07-16 ENCOUNTER — Telehealth: Payer: Self-pay | Admitting: Family Medicine

## 2023-07-16 ENCOUNTER — Ambulatory Visit (INDEPENDENT_AMBULATORY_CARE_PROVIDER_SITE_OTHER): Payer: Federal, State, Local not specified - PPO | Admitting: Family Medicine

## 2023-07-16 ENCOUNTER — Encounter: Payer: Self-pay | Admitting: Family Medicine

## 2023-07-16 VITALS — BP 120/76 | HR 93 | Temp 97.7°F | Resp 16 | Ht 66.0 in | Wt 180.1 lb

## 2023-07-16 DIAGNOSIS — N1831 Chronic kidney disease, stage 3a: Secondary | ICD-10-CM

## 2023-07-16 DIAGNOSIS — G4700 Insomnia, unspecified: Secondary | ICD-10-CM

## 2023-07-16 DIAGNOSIS — E1169 Type 2 diabetes mellitus with other specified complication: Secondary | ICD-10-CM | POA: Diagnosis not present

## 2023-07-16 DIAGNOSIS — E0590 Thyrotoxicosis, unspecified without thyrotoxic crisis or storm: Secondary | ICD-10-CM

## 2023-07-16 DIAGNOSIS — J0110 Acute frontal sinusitis, unspecified: Secondary | ICD-10-CM

## 2023-07-16 DIAGNOSIS — F39 Unspecified mood [affective] disorder: Secondary | ICD-10-CM | POA: Diagnosis not present

## 2023-07-16 DIAGNOSIS — I152 Hypertension secondary to endocrine disorders: Secondary | ICD-10-CM

## 2023-07-16 DIAGNOSIS — J302 Other seasonal allergic rhinitis: Secondary | ICD-10-CM

## 2023-07-16 DIAGNOSIS — E042 Nontoxic multinodular goiter: Secondary | ICD-10-CM

## 2023-07-16 DIAGNOSIS — E118 Type 2 diabetes mellitus with unspecified complications: Secondary | ICD-10-CM

## 2023-07-16 DIAGNOSIS — D638 Anemia in other chronic diseases classified elsewhere: Secondary | ICD-10-CM

## 2023-07-16 DIAGNOSIS — I700 Atherosclerosis of aorta: Secondary | ICD-10-CM

## 2023-07-16 DIAGNOSIS — D509 Iron deficiency anemia, unspecified: Secondary | ICD-10-CM

## 2023-07-16 DIAGNOSIS — I10 Essential (primary) hypertension: Secondary | ICD-10-CM

## 2023-07-16 DIAGNOSIS — Z1231 Encounter for screening mammogram for malignant neoplasm of breast: Secondary | ICD-10-CM

## 2023-07-16 DIAGNOSIS — N2581 Secondary hyperparathyroidism of renal origin: Secondary | ICD-10-CM

## 2023-07-16 DIAGNOSIS — E1159 Type 2 diabetes mellitus with other circulatory complications: Secondary | ICD-10-CM

## 2023-07-16 DIAGNOSIS — E785 Hyperlipidemia, unspecified: Secondary | ICD-10-CM

## 2023-07-16 MED ORDER — CITALOPRAM HYDROBROMIDE 10 MG PO TABS: 10.0000 mg | ORAL_TABLET | Freq: Every day | ORAL | 0 refills | Status: DC

## 2023-07-16 MED ORDER — FEXOFENADINE HCL 180 MG PO TABS: 180.0000 mg | ORAL_TABLET | Freq: Every day | ORAL | 1 refills | Status: AC

## 2023-07-16 MED ORDER — FLUTICASONE PROPIONATE 50 MCG/ACT NA SUSP: 2.0000 | Freq: Every day | NASAL | 6 refills | Status: AC

## 2023-07-16 MED ORDER — OLMESARTAN MEDOXOMIL-HCTZ 20-12.5 MG PO TABS: ORAL_TABLET | ORAL | 1 refills | Status: DC

## 2023-07-16 NOTE — Patient Instructions (Addendum)
It was a pleasure meeting you today. Thank you for allowing me to take part in your health care.  Our goals for today as we discussed include:  Fast for 10 hours before your lab appointment  Start Citalopram 10 mg daily for anxiety  Follow up in 2 weeks Schedule Medicare Annual Wellness Visit   Recommend Shingles vaccine.  This is a 2 dose series and can be given at your local pharmacy.  Please talk to your pharmacist about this.   Recommend Tetanus Vaccination.  This is given every 10 years.   Recommend Pneumonia 20 vaccine   If you have any questions or concerns, please do not hesitate to call the office at [phone].  I look forward to our next visit and until then take care and stay safe.  Regards,   Dana Allan, MD   [facility]

## 2023-07-16 NOTE — Progress Notes (Unsigned)
SUBJECTIVE:   Chief Complaint  Patient presents with   Establish Care    Transferring from Dr. Judie Grieve   HPI Presents to clinic to transfer care  No acute concerns today  Hypertension Asymptomatic. Was prescribed Atenolol 50 mg daily, Amoldipine 5 mg daily and Benicar 20-12.5 mg daily. Reports taking Amlodipine and Benicar.  Tolerating medication well.  Requesting refill for Benicar.  CKD Stage 3b Reports follows with Nephrology and indicates that kidney doctor has prescribed her 2 blood pressure medications.   Hyperthyroid Denies any symptoms.  Reports taking Methimazole 10 mg daily and tolerating well.  She follows with [facility] Endocrinology. Recent TSH < 0.010, T4 1.21  Mood disorder Endorses low mood and fatigue.  Does not sleep well at night.  Had previously taken Celexa in past but self discontinued.  Also tried Restoril but lost effectiveness.  Unable to tolerate Belsomra or Ambien.  Was prescribed Lunesta last used in 2022.  Not currently on SSRI but wanting to restart.  Denies SI/HI. No previous evaluation by psychiatry or therapy.  PERTINENT PMH / PSH: HTN HLD Hyperthyroid Mood disorder CKD 3b  OBJECTIVE:  BP 120/76   Pulse 93   Temp 97.7 F (36.5 C)   Resp 16   Ht 5\' 6"  (1.676 m)   Wt 180 lb 2 oz (81.7 kg)   SpO2 99%   BMI 29.07 kg/m    Physical Exam Vitals reviewed.  Constitutional:      General: She is not in acute distress.    Appearance: Normal appearance. She is not ill-appearing.  HENT:     Head: Normocephalic.     Right Ear: Tympanic membrane, ear canal and external ear normal.     Left Ear: Tympanic membrane, ear canal and external ear normal.     Mouth/Throat:     Mouth: Mucous membranes are moist.  Eyes:     Conjunctiva/sclera: Conjunctivae normal.  Neck:     Thyroid: No thyromegaly or thyroid tenderness.  Cardiovascular:     Rate and Rhythm: Normal rate and regular rhythm.     Pulses: Normal pulses.  Pulmonary:     Effort:  Pulmonary effort is normal.     Breath sounds: Normal breath sounds.  Abdominal:     General: Bowel sounds are normal.  Lymphadenopathy:     Cervical: No cervical adenopathy.  Skin:    General: Skin is warm and dry.  Neurological:     General: No focal deficit present.     Mental Status: She is alert and oriented to person, place, and time. Mental status is at baseline.  Psychiatric:        Mood and Affect: Mood normal.        Behavior: Behavior normal.        Thought Content: Thought content normal.        Judgment: Judgment normal.        07/16/2023    2:01 PM 12/13/2021    3:17 PM 07/25/2020   11:47 AM 01/14/2020    1:59 PM 10/14/2019    8:14 AM  Depression screen PHQ 2/9  Decreased Interest 2 0 0 0 0  Down, Depressed, Hopeless 2 0 1 0 0  PHQ - 2 Score 4 0 1 0 0  Altered sleeping 2  0    Tired, decreased energy 2  0    Change in appetite 2  0    Feeling bad or failure about yourself  0  0  Trouble concentrating 0  0    Moving slowly or fidgety/restless 0  0    Suicidal thoughts 0  0    PHQ-9 Score 10  1    Difficult doing work/chores Not difficult at all  Not difficult at all        07/16/2023    2:02 PM 07/25/2020   11:47 AM  GAD 7 : Generalized Anxiety Score  Nervous, Anxious, on Edge 1 0  Control/stop worrying 0 0  Worry too much - different things 1 0  Trouble relaxing 1 0  Restless 0 0  Easily annoyed or irritable 1 0  Afraid - awful might happen 0 0  Total GAD 7 Score 4 0  Anxiety Difficulty Not difficult at all Not difficult at all    ASSESSMENT/PLAN:  Hypertension associated with diabetes [facility]) Assessment & Plan: Chronic Well controlled Continue Amlodipine 5 mg daily Refill Benicar 20-12.5 mg daily Check Cmet   Orders: -     Comprehensive metabolic panel  Mood disorder (HCC) -     Citalopram Hydrobromide; Take 1 tablet (10 mg total) by mouth daily.  Dispense: 30 tablet; Refill: 0 -     VITAMIN D 25 Hydroxy (Vit-D Deficiency, Fractures) -      Vitamin B12  Hyperlipidemia associated with type 2 diabetes mellitus (HCC) Assessment & Plan: Chronic. Recent LDL 121, goal <70 On Zetia 10 mg daily Recommend statin if able to tolerate Check fasting lipids  Orders: -     Lipid panel  Type 2 diabetes mellitus with complications (HCC) Assessment & Plan: Chronic A1c 6.2 in 2020 A1c 6.4 today, well controlled On ARB Not on statin Recommend annual eye exam annually Foot exam at next visit UACR today  Orders: -     Olmesartan Medoxomil-HCTZ; TAKE 1 TABLET BY MOUTH DAILY IN THE MORNING  Dispense: 90 tablet; Refill: 1 -     Hemoglobin A1c -     Comprehensive metabolic panel -     Microalbumin / creatinine urine ratio  Stage 3a chronic kidney disease (HCC) Assessment & Plan: Chronic Per Nephrology patient to continue Lisinopril, Amlodipine, Hydrochlorothiazide and Atenolol. Patient reports not taking Atenolol  Continue Benicar and Amlodipine daily at current doses Atenolol discontinued     Secondary hyperparathyroidism of renal origin [facility]) Assessment & Plan: Chronic Asymptomatic Follows with Nephrology, no indication for calcitriol at this time and to continue with cholecalciferol    Aortic atherosclerosis (HCC) Assessment & Plan: Chronic Previously prescribed statin but not currently taking Takes Zetia 10 mg daily Check fasting lipids    Breast cancer screening by mammogram -     3D Screening Mammogram, Left and Right; Future  Seasonal allergies -     Fluticasone Propionate; Place 2 sprays into both nostrils daily.  Dispense: 16 g; Refill: 6 -     Fexofenadine HCl; Take 1 tablet (180 mg total) by mouth daily.  Dispense: 180 tablet; Refill: 1  Anemia of chronic disease Assessment & Plan: Check CBC, Iron, Ferritin   Orders: -     CBC -     Iron, TIBC and Ferritin Panel  Multinodular goiter Assessment & Plan: Chronic Follows with [facility] Endocrinology   Hyperthyroidism Assessment & Plan: Chronic.  Recent TSH low, T4 slightly increased Takes Methimazole 10 mg daily Follow up with Endocrinology as scheduled.    PDMP reviewed  Return in about 2 weeks (around 07/30/2023) for PCP.  Dana Allan, MD

## 2023-07-16 NOTE — Telephone Encounter (Signed)
Patient came in and wanted to let Dr. Clent Ridges know what kind of medication she is taking. It's called Amlodipine Besyate 5 mg tablets.

## 2023-07-31 DIAGNOSIS — D509 Iron deficiency anemia, unspecified: Secondary | ICD-10-CM | POA: Diagnosis not present

## 2023-07-31 DIAGNOSIS — E1159 Type 2 diabetes mellitus with other circulatory complications: Secondary | ICD-10-CM | POA: Diagnosis not present

## 2023-07-31 DIAGNOSIS — E785 Hyperlipidemia, unspecified: Secondary | ICD-10-CM | POA: Diagnosis not present

## 2023-07-31 DIAGNOSIS — E1169 Type 2 diabetes mellitus with other specified complication: Secondary | ICD-10-CM | POA: Diagnosis not present

## 2023-07-31 DIAGNOSIS — E118 Type 2 diabetes mellitus with unspecified complications: Secondary | ICD-10-CM | POA: Diagnosis not present

## 2023-07-31 DIAGNOSIS — F39 Unspecified mood [affective] disorder: Secondary | ICD-10-CM | POA: Diagnosis not present

## 2023-08-01 LAB — MICROALBUMIN / CREATININE URINE RATIO
Creatinine, Urine: 84.8 mg/dL
Microalb/Creat Ratio: 132 mg/g{creat} — ABNORMAL HIGH (ref 0–29)
Microalbumin, Urine: 111.6 ug/mL

## 2023-08-02 ENCOUNTER — Encounter: Payer: Self-pay | Admitting: Family Medicine

## 2023-08-02 DIAGNOSIS — D638 Anemia in other chronic diseases classified elsewhere: Secondary | ICD-10-CM | POA: Insufficient documentation

## 2023-08-02 DIAGNOSIS — F39 Unspecified mood [affective] disorder: Secondary | ICD-10-CM | POA: Insufficient documentation

## 2023-08-02 DIAGNOSIS — Z1231 Encounter for screening mammogram for malignant neoplasm of breast: Secondary | ICD-10-CM | POA: Insufficient documentation

## 2023-08-02 DIAGNOSIS — N2581 Secondary hyperparathyroidism of renal origin: Secondary | ICD-10-CM | POA: Insufficient documentation

## 2023-08-02 DIAGNOSIS — E0590 Thyrotoxicosis, unspecified without thyrotoxic crisis or storm: Secondary | ICD-10-CM | POA: Insufficient documentation

## 2023-08-02 DIAGNOSIS — J302 Other seasonal allergic rhinitis: Secondary | ICD-10-CM | POA: Insufficient documentation

## 2023-08-02 NOTE — Assessment & Plan Note (Signed)
Check A1c. 

## 2023-08-02 NOTE — Assessment & Plan Note (Addendum)
Chronic Per Nephrology patient to continue Lisinopril, Amlodipine, Hydrochlorothiazide and Atenolol. Patient reports not taking Atenolol  Continue Benicar and Amlodipine daily at current doses Atenolol discontinued

## 2023-08-02 NOTE — Assessment & Plan Note (Signed)
Chronic Asymptomatic Follows with Nephrology, no indication for calcitriol at this time and to continue with cholecalciferol

## 2023-08-02 NOTE — Assessment & Plan Note (Addendum)
Chronic. Recent LDL 121, goal <70 On Zetia 10 mg daily Recommend statin if able to tolerate Check fasting lipids

## 2023-08-02 NOTE — Assessment & Plan Note (Signed)
Chronic. Recent TSH low, T4 slightly increased Takes Methimazole 10 mg daily Follow up with Endocrinology as scheduled.

## 2023-08-02 NOTE — Assessment & Plan Note (Signed)
Chronic Follows with [facility] Endocrinology

## 2023-08-02 NOTE — Assessment & Plan Note (Signed)
Chronic Previously prescribed statin but not currently taking Takes Zetia 10 mg daily Check fasting lipids

## 2023-08-02 NOTE — Assessment & Plan Note (Signed)
Chronic A1c 6.2 in 2020 A1c 6.4 today, well controlled On ARB Not on statin Recommend annual eye exam annually Foot exam at next visit UACR today

## 2023-08-02 NOTE — Assessment & Plan Note (Signed)
Chronic Well controlled Continue Amlodipine 5 mg daily Refill Benicar 20-12.5 mg daily Check Cmet

## 2023-08-02 NOTE — Assessment & Plan Note (Signed)
Check CBC, Iron, Ferritin

## 2023-08-14 ENCOUNTER — Telehealth: Payer: Self-pay | Admitting: Family Medicine

## 2023-08-14 DIAGNOSIS — E785 Hyperlipidemia, unspecified: Secondary | ICD-10-CM

## 2023-08-14 NOTE — Telephone Encounter (Signed)
Pt called in looking for her blood work results. Note below under labs from provider was read to pt. Pt would like to f/u with the nurse regarding the starting of med Crestor.

## 2023-08-15 MED ORDER — ROSUVASTATIN CALCIUM 10 MG PO TABS
10.0000 mg | ORAL_TABLET | Freq: Every day | ORAL | 3 refills | Status: DC
Start: 2023-08-15 — End: 2024-04-06

## 2023-08-15 NOTE — Telephone Encounter (Signed)
Spoke to pt, pt is in agreement with starting crestor. Medication sent to [facility] in [place]

## 2023-08-24 ENCOUNTER — Other Ambulatory Visit: Payer: Self-pay | Admitting: Family Medicine

## 2023-08-24 DIAGNOSIS — F39 Unspecified mood [affective] disorder: Secondary | ICD-10-CM

## 2023-08-28 ENCOUNTER — Encounter: Payer: Self-pay | Admitting: *Deleted

## 2023-08-29 ENCOUNTER — Ambulatory Visit (INDEPENDENT_AMBULATORY_CARE_PROVIDER_SITE_OTHER): Payer: Medicare Other | Admitting: Nurse Practitioner

## 2023-08-29 VITALS — BP 112/68 | HR 103 | Temp 98.0°F | Resp 16 | Ht 66.0 in | Wt 181.5 lb

## 2023-08-29 DIAGNOSIS — F39 Unspecified mood [affective] disorder: Secondary | ICD-10-CM

## 2023-08-29 DIAGNOSIS — F5104 Psychophysiologic insomnia: Secondary | ICD-10-CM

## 2023-08-29 MED ORDER — HYDROXYZINE PAMOATE 25 MG PO CAPS: 25.0000 mg | ORAL_CAPSULE | Freq: Three times a day (TID) | ORAL | 0 refills | Status: DC | PRN

## 2023-08-29 NOTE — Progress Notes (Unsigned)
Established Patient Office Visit  Subjective:  Patient ID: Kendra Rhodes, female    DOB: 08-30-1956  Age: 67 y.o. MRN: [id number]  CC:  Chief Complaint  Patient presents with   Medication Refill    Wants medication tot help sleep    HPI  Kendra Rhodes presents for medication refill. She states she has been problem sleeping since last 10 year. She had tried Restoril and lunesta, belsomra, ambien in the past.  She had sleep study 10 year ago. She is able to fall a sleep but not able to  stay a sleep through the night. Have tried OTC sleep aid.   HPI   Past Medical History:  Diagnosis Date   Anemia    iron deficiency and vitamin d deficiency   Anxiety    Bladder mass 11/2019   Breast mass 10/2019   bilateral, ? cysts   CKD (chronic kidney disease), stage III (HCC)    COVID-19    01/03/21   Depression    Hypertension    Insomnia    Iron deficiency anemia due to chronic blood loss    Obesity (BMI 30-39.9) 05/02/2020   Pre-diabetes    Sickle cell trait (HCC)    Stage 2 chronic kidney disease 05/02/2020   Thyroid goiter    Tubular adenoma of colon    Wears contact lenses    Wears dentures    upper and lower partials, also permanent retainer    Past Surgical History:  Procedure Laterality Date   ABDOMINAL HYSTERECTOMY     for prolapse per pt with mesh ovaries still intact no h/o abnormal pap   COLONOSCOPY WITH PROPOFOL N/A 06/10/2019   Procedure: COLONOSCOPY WITH PROPOFOL;  Surgeon: Midge Minium, MD;  Location: [facility];  Service: Endoscopy;  Laterality: N/A;   CYSTOSCOPY WITH BIOPSY N/A 12/03/2019   Procedure: CYSTOSCOPY WITH bladder BIOPSY;  Surgeon: Sondra Come, MD;  Location: [facility] ORS;  Service: Urology;  Laterality: N/A;   CYSTOSCOPY WITH FULGERATION N/A 12/03/2019   Procedure: CYSTOSCOPY WITH FULGERATION;  Surgeon: Sondra Come, MD;  Location: [facility] OR[facility];  Service: Urology;  Laterality: N/A;   ESOPHAGOGASTRODUODENOSCOPY (EGD) WITH PROPOFOL N/A  06/10/2019   Procedure: ESOPHAGOGASTRODUODENOSCOPY (EGD) WITH PROPOFOL;  Surgeon: Midge Minium, MD;  Location: [facility];  Service: Endoscopy;  Laterality: N/A;   EYE SURGERY Bilateral    pterygium, both sides repaired   GIVENS CAPSULE STUDY N/A 08/24/2019   Procedure: GIVENS CAPSULE STUDY;  Surgeon: Midge Minium, MD;  Location: [facility] ENDOSCOPY;  Service: Endoscopy;  Laterality: N/A;   POLYPECTOMY  06/10/2019   Procedure: POLYPECTOMY;  Surgeon: Midge Minium, MD;  Location: [facility];  Service: Endoscopy;;    Family History  Problem Relation Age of Onset   Breast cancer Mother 47   Diabetes Mother    Kidney failure Father    Alcohol abuse Father    Heart disease Father        chf   Hyperlipidemia Father    Lung cancer Maternal Aunt    Autism Grandson    Other Other        fatty liver cousin   Sickle cell anemia Other        nephew   Cancer Other        aunt with ovarian cancer    Social History   Socioeconomic History   Marital status: Divorced    Spouse name: Not on file   Number of children: Not on file  Years of education: Not on file   Highest education level: Doctorate  Occupational History   Not on file  Tobacco Use   Smoking status: Never   Smokeless tobacco: Never  Vaping Use   Vaping status: Never Used  Substance and Sexual Activity   Alcohol use: Never   Drug use: Never   Sexual activity: Yes    Birth control/protection: Surgical    Comment: Hysterectomy  Other Topics Concern   Not on file  Social History Narrative   [employer] employee   2 kids daughter Michelene Gardener and son Llesuer Maisie Fus   Works homeless shelter weekends    Works [employer] in [place] as of 04/2020   Social Determinants of Health   Financial Resource Strain: Low Risk  (08/29/2023)   Overall Financial Resource Strain (CARDIA)    Difficulty of Paying Living Expenses: Not hard at all  Food Insecurity: No Food Insecurity (08/29/2023)   Hunger Vital Sign    Worried  About Running Out of Food in the Last Year: Never true    Ran Out of Food in the Last Year: Never true  Transportation Needs: No Transportation Needs (08/29/2023)   PRAPARE - Administrator, Civil Service (Medical): No    Lack of Transportation (Non-Medical): No  Physical Activity: Insufficiently Active (08/29/2023)   Exercise Vital Sign    Days of Exercise per Week: 1 day    Minutes of Exercise per Session: 20 min  Stress: No Stress Concern Present (08/29/2023)   [employer] of Occupational Health - Occupational Stress Questionnaire    Feeling of Stress : Only a little  Social Connections: Moderately Integrated (08/29/2023)   Social Connection and Isolation Panel [NHANES]    Frequency of Communication with Friends and Family: More than three times a week    Frequency of Social Gatherings with Friends and Family: More than three times a week    Attends Religious Services: More than 4 times per year    Active Member of [employer] or Organizations: Yes    Attends Engineer, structural: More than 4 times per year    Marital Status: Divorced  Catering manager Violence: Not on file     Outpatient Medications Prior to Visit  Medication Sig Dispense Refill   amLODipine (NORVASC) 5 MG tablet Take 1 tablet (5 mg total) by mouth daily. 90 tablet 3   azelastine (ASTELIN) 0.1 % nasal spray Place 2 sprays into both nostrils 2 (two) times daily. Use in each nostril as directed prn 30 mL 0   Blood Pressure KIT 1 Device by Does not apply route daily. BP cuff adult large size 1 kit 0   Cholecalciferol (VITAMIN D) 50 MCG (2000 UT) CAPS Take 2,000 Units by mouth every 7 (seven) days.     citalopram (CELEXA) 10 MG tablet TAKE 1 TABLET(10 MG) BY MOUTH DAILY 30 tablet 0   ezetimibe (ZETIA) 10 MG tablet Take 1 tablet (10 mg total) by mouth daily. 90 tablet 3   fexofenadine (ALLEGRA ALLERGY) 180 MG tablet Take 1 tablet (180 mg total) by mouth daily. 180 tablet 1   fluticasone (FLONASE) 50  MCG/ACT nasal spray Place 2 sprays into both nostrils daily. 16 g 6   methimazole (TAPAZOLE) 5 MG tablet Take 10 mg by mouth daily.     olmesartan-hydrochlorothiazide (BENICAR HCT) 20-12.5 MG tablet TAKE 1 TABLET BY MOUTH DAILY IN THE MORNING 90 tablet 1   rosuvastatin (CRESTOR) 10 MG tablet Take 1 tablet (  10 mg total) by mouth daily. 90 tablet 3   No facility-administered medications prior to visit.    Allergies  Allergen Reactions   Sulfa Antibiotics Itching, Swelling and Other (See Comments)   Wheat Other (See Comments)    bloating   Belsomra [Suvorexant]     Nightmares    Melatonin     Night mares     Ambien [Zolpidem Tartrate] Other (See Comments)    Nightmares    Aspirin Other (See Comments)    Gastric irritation   Prednisone Palpitations   Shellfish Allergy Itching   Strawberry Extract Itching    ROS Review of Systems Negative unless indicated in HPI.    Objective:    Physical Exam Constitutional:      Appearance: Normal appearance.  Cardiovascular:     Rate and Rhythm: Normal rate and regular rhythm.     Pulses: Normal pulses.     Heart sounds: Normal heart sounds.  Pulmonary:     Effort: Pulmonary effort is normal.     Breath sounds: Normal breath sounds. No stridor. No wheezing.  Musculoskeletal:     Cervical back: Normal range of motion.  Neurological:     General: No focal deficit present.     Mental Status: She is alert. Mental status is at baseline.  Psychiatric:        Mood and Affect: Mood normal.        Behavior: Behavior normal.        Thought Content: Thought content normal.        Judgment: Judgment normal.     BP 112/68   Pulse (!) 103   Temp 98 F (36.7 C)   Resp 16   Ht 5\' 6"  (1.676 m)   Wt 181 lb 8 oz (82.3 kg)   SpO2 98%   BMI 29.29 kg/m  Wt Readings from Last 3 Encounters:  08/29/23 181 lb 8 oz (82.3 kg)  07/16/23 180 lb 2 oz (81.7 kg)  02/04/23 184 lb (83.5 kg)     Health Maintenance  Topic Date Due    DTaP/Tdap/Td (1 - Tdap) Never done   INFLUENZA VACCINE  07/17/2023   COVID-19 Vaccine (4 - 2023-24 season) 08/17/2023   Zoster Vaccines- Shingrix (1 of 2) 11/02/2023 (Originally 07/01/1975)   Pneumonia Vaccine 31+ Years old (1 of 1 - PCV) 08/01/2024 (Originally 07/01/2022)   HEMOGLOBIN A1C  01/31/2024   Colonoscopy  06/09/2024   Diabetic kidney evaluation - eGFR measurement  07/30/2024   Diabetic kidney evaluation - Urine ACR  07/30/2024   MAMMOGRAM  08/30/2024   DEXA SCAN  Completed   Hepatitis C Screening  Completed   HPV VACCINES  Aged Out   FOOT EXAM  Discontinued   OPHTHALMOLOGY EXAM  Discontinued    There are no preventive care reminders to display for this patient.  Lab Results  Component Value Date   TSH 0.180 (L) 09/10/2019   Lab Results  Component Value Date   WBC 8.4 07/31/2023   HGB 11.3 07/31/2023   HCT 34.7 07/31/2023   MCV 86 07/31/2023   PLT 291 07/31/2023   Lab Results  Component Value Date   NA 144 07/31/2023   K 4.5 07/31/2023   CO2 22 07/31/2023   GLUCOSE 93 07/31/2023   BUN 24 07/31/2023   CREATININE 1.29 (H) 07/31/2023   BILITOT 0.3 07/31/2023   ALKPHOS 169 (H) 07/31/2023   AST 13 07/31/2023   ALT 12 07/31/2023   PROT 7.5 07/31/2023  ALBUMIN 4.2 07/31/2023   CALCIUM 10.4 (H) 07/31/2023   ANIONGAP 8 09/04/2013   EGFR 46 (L) 07/31/2023   Lab Results  Component Value Date   CHOL 245 (H) 07/31/2023   Lab Results  Component Value Date   HDL 59 07/31/2023   Lab Results  Component Value Date   LDLCALC 163 (H) 07/31/2023   Lab Results  Component Value Date   TRIG 128 07/31/2023   Lab Results  Component Value Date   CHOLHDL 4.2 07/31/2023   Lab Results  Component Value Date   HGBA1C 6.4 (H) 07/31/2023      Assessment & Plan:  Chronic insomnia Assessment & Plan: Sleep hygiene discussed with patient. Advised to take hydroxyzine 25 mg for insomnia. Discussed to avoid caffeine after 5 pm and read book before going to  bed.    Other orders -     hydrOXYzine Pamoate; Take 1 capsule (25 mg total) by mouth every 8 (eight) hours as needed.  Dispense: 30 capsule; Refill: 0    Follow-up: No follow-ups on file.   Kara Dies, NP

## 2023-08-31 ENCOUNTER — Encounter: Payer: Self-pay | Admitting: Nurse Practitioner

## 2023-08-31 NOTE — Assessment & Plan Note (Addendum)
Sleep hygiene discussed with patient. Advised to take hydroxyzine 25 mg for insomnia. Discussed to avoid caffeine after 5 pm and read book before going to bed.

## 2023-09-02 ENCOUNTER — Ambulatory Visit
Admission: RE | Admit: 2023-09-02 | Discharge: 2023-09-02 | Disposition: A | Discharge status: Home / Self Care | Payer: Federal, State, Local not specified - PPO | Source: Ambulatory Visit | Attending: Family Medicine | Admitting: Family Medicine

## 2023-09-02 DIAGNOSIS — Z1231 Encounter for screening mammogram for malignant neoplasm of breast: Secondary | ICD-10-CM | POA: Insufficient documentation

## 2023-09-04 DIAGNOSIS — N1832 Chronic kidney disease, stage 3b: Secondary | ICD-10-CM | POA: Diagnosis not present

## 2023-09-04 DIAGNOSIS — E042 Nontoxic multinodular goiter: Secondary | ICD-10-CM | POA: Diagnosis not present

## 2023-09-04 DIAGNOSIS — I10 Essential (primary) hypertension: Secondary | ICD-10-CM | POA: Diagnosis not present

## 2023-09-04 DIAGNOSIS — D631 Anemia in chronic kidney disease: Secondary | ICD-10-CM | POA: Diagnosis not present

## 2023-09-04 DIAGNOSIS — N2581 Secondary hyperparathyroidism of renal origin: Secondary | ICD-10-CM | POA: Diagnosis not present

## 2023-09-04 DIAGNOSIS — R809 Proteinuria, unspecified: Secondary | ICD-10-CM | POA: Diagnosis not present

## 2023-09-11 DIAGNOSIS — E0590 Thyrotoxicosis, unspecified without thyrotoxic crisis or storm: Secondary | ICD-10-CM | POA: Diagnosis not present

## 2023-09-11 DIAGNOSIS — E042 Nontoxic multinodular goiter: Secondary | ICD-10-CM | POA: Diagnosis not present

## 2023-09-26 ENCOUNTER — Other Ambulatory Visit: Payer: Self-pay | Admitting: Family Medicine

## 2023-09-26 DIAGNOSIS — F39 Unspecified mood [affective] disorder: Secondary | ICD-10-CM

## 2023-09-30 DIAGNOSIS — E042 Nontoxic multinodular goiter: Secondary | ICD-10-CM | POA: Diagnosis not present

## 2023-10-08 ENCOUNTER — Other Ambulatory Visit: Payer: Self-pay | Admitting: Nurse Practitioner

## 2024-01-24 ENCOUNTER — Other Ambulatory Visit: Payer: Self-pay | Admitting: Family Medicine

## 2024-01-24 DIAGNOSIS — F39 Unspecified mood [affective] disorder: Secondary | ICD-10-CM

## 2024-02-06 ENCOUNTER — Ambulatory Visit
Admission: EM | Admit: 2024-02-06 | Discharge: 2024-02-06 | Disposition: A | Discharge status: Home / Self Care | Payer: Federal, State, Local not specified - PPO

## 2024-02-06 DIAGNOSIS — J069 Acute upper respiratory infection, unspecified: Secondary | ICD-10-CM

## 2024-02-06 MED ORDER — PROMETHAZINE-DM 6.25-15 MG/5ML PO SYRP: 5.0000 mL | ORAL_SOLUTION | Freq: Four times a day (QID) | ORAL | 0 refills | Status: DC | PRN

## 2024-02-06 MED ORDER — AEROCHAMBER MV MISC: 2 refills | Status: AC

## 2024-02-06 MED ORDER — IPRATROPIUM BROMIDE 0.06 % NA SOLN: 2.0000 | Freq: Four times a day (QID) | NASAL | 12 refills | Status: AC

## 2024-02-06 MED ORDER — ALBUTEROL SULFATE HFA 108 (90 BASE) MCG/ACT IN AERS: 2.0000 | INHALATION_SPRAY | RESPIRATORY_TRACT | 0 refills | Status: AC | PRN

## 2024-02-06 MED ORDER — BENZONATATE 100 MG PO CAPS
200.0000 mg | ORAL_CAPSULE | Freq: Three times a day (TID) | ORAL | 0 refills | Status: DC
Start: 2024-02-06 — End: 2024-03-26

## 2024-02-06 NOTE — ED Triage Notes (Signed)
 Sx x 6 days  Productive cough with yellow mucus Sinus pressure Bilateral ear pain

## 2024-02-06 NOTE — Discharge Instructions (Addendum)
 You have been diagnosed today with a URI with cough and congestion.  Please use over-the-counter Tylenol and/or ibuprofen according to pack instructions as needed for any fever or pain.  Take the Augmentin 875 mg twice daily with food for 7 days for treatment of your URI.  Use the albuterol inhaler, with the spacer, and use 1 to 2 puffs every 4-6 hours as needed for any shortness of breath or wheezing.  Use the Atrovent nasal spray, 2 squirts in each nostril every 6 hours, as needed for runny nose and postnasal drip.  Use the Tessalon Perles every 8 hours during the day.  Take them with a small sip of water.  They may give you some numbness to the base of your tongue or a metallic taste in your mouth, this is normal.  Use the Promethazine DM cough syrup at bedtime for cough and congestion.  It will make you drowsy so do not take it during the day.  Return for reevaluation or see your primary care provider for any new or worsening symptoms.

## 2024-02-06 NOTE — ED Provider Notes (Signed)
 [facility]    CSN: [id number] Arrival date & time: 02/06/24  0900      History   Chief Complaint Chief Complaint  Patient presents with   Cough   Facial Pain    HPI Kendra Rhodes is a 68 y.o. female.   HPI  68 year old female with a past medical history significant for hypertension, prediabetes, stage II chronic kidney disease, IDA, sickle cell trait, and thyroid Gorder presents for evaluation of respiratory symptoms that started 6 days ago.  She reports that she was out of the country last week and [place].  She is reporting nasal congestion with bloody nasal discharge, bilateral ear pain, pain in her upper teeth, a sore throat that has resolved, sinus pressure, productive cough for yellow sputum, and shortness of breath.  She denies any fever, wheezing, or GI complaints.  Past Medical History:  Diagnosis Date   Anemia    iron deficiency and vitamin d deficiency   Anxiety    Bladder mass 11/2019   Breast mass 10/2019   bilateral, ? cysts   CKD (chronic kidney disease), stage III (HCC)    COVID-19    01/03/21   Depression    Hypertension    Insomnia    Iron deficiency anemia due to chronic blood loss    Obesity (BMI 30-39.9) 05/02/2020   Pre-diabetes    Sickle cell trait (HCC)    Stage 2 chronic kidney disease 05/02/2020   Thyroid goiter    Tubular adenoma of colon    Wears contact lenses    Wears dentures    upper and lower partials, also permanent retainer    Patient Active Problem List   Diagnosis Date Noted   Seasonal allergies 08/02/2023   Anemia of chronic disease 08/02/2023   Hyperthyroidism 08/02/2023   Secondary hyperparathyroidism of renal origin (HCC) 08/02/2023   Breast cancer screening by mammogram 08/02/2023   Mood disorder (HCC) 08/02/2023   Cough 05/09/2021   Snoring 02/22/2021   Hiatal hernia 11/27/2020   Liver cyst 11/27/2020   Cardiomegaly 11/27/2020   Aortic atherosclerosis (HCC) 11/27/2020   Gallstones 01/17/2020    Kidney cysts 01/14/2020   Pancreas cyst 01/14/2020   Annual physical exam 10/14/2019   Chronic kidney disease (CKD), stage III (moderate) (HCC) 10/14/2019   Bilateral breast cysts 10/14/2019   Thyroid nodule 09/17/2019   Vitamin D deficiency 09/13/2019   Abnormal thyroid function test 09/13/2019   Hematuria 09/13/2019   Type 2 diabetes mellitus with complications (HCC) 09/13/2019   Atrophic vaginitis 08/11/2019   Elevated alkaline phosphatase level 08/11/2019   Chronic insomnia 08/11/2019   Family history of breast cancer 08/11/2019   Chronic constipation 08/11/2019   Anxiety and depression    Multinodular goiter 08/10/2019   Hypertension associated with diabetes (HCC) 08/10/2019   Hyperlipidemia associated with type 2 diabetes mellitus (HCC) 08/10/2019   Polyp of sigmoid colon    Acute gastritis without hemorrhage    Bursitis of shoulder 02/24/2019   Shoulder joint pain 02/24/2019   Age-related nuclear cataract of both eyes 09/23/2018   Irregular astigmatism of both eyes 09/23/2018   Progressive peripheral pterygium of both eyes 09/23/2018    Past Surgical History:  Procedure Laterality Date   ABDOMINAL HYSTERECTOMY     for prolapse per pt with mesh ovaries still intact no h/o abnormal pap   COLONOSCOPY WITH PROPOFOL N/A 06/10/2019   Procedure: COLONOSCOPY WITH PROPOFOL;  Surgeon: Midge Minium, MD;  Location: [facility];  Service: Endoscopy;  Laterality: N/A;   CYSTOSCOPY WITH BIOPSY N/A 12/03/2019   Procedure: CYSTOSCOPY WITH bladder BIOPSY;  Surgeon: Sondra Come, MD;  Location: [facility];  Service: Urology;  Laterality: N/A;   CYSTOSCOPY WITH FULGERATION N/A 12/03/2019   Procedure: CYSTOSCOPY WITH FULGERATION;  Surgeon: Sondra Come, MD;  Location: [facility];  Service: Urology;  Laterality: N/A;   ESOPHAGOGASTRODUODENOSCOPY (EGD) WITH PROPOFOL N/A 06/10/2019   Procedure: ESOPHAGOGASTRODUODENOSCOPY (EGD) WITH PROPOFOL;  Surgeon: Midge Minium, MD;  Location:  [facility];  Service: Endoscopy;  Laterality: N/A;   EYE SURGERY Bilateral    pterygium, both sides repaired   GIVENS CAPSULE STUDY N/A 08/24/2019   Procedure: GIVENS CAPSULE STUDY;  Surgeon: Midge Minium, MD;  Location: [facility];  Service: Endoscopy;  Laterality: N/A;   POLYPECTOMY  06/10/2019   Procedure: POLYPECTOMY;  Surgeon: Midge Minium, MD;  Location: [facility];  Service: Endoscopy;;    OB History   No obstetric history on file.      Home Medications    Prior to Admission medications   Medication Sig Start Date End Date Taking? Authorizing Provider  albuterol (VENTOLIN HFA) 108 (90 Base) MCG/ACT inhaler Inhale 2 puffs into the lungs every 4 (four) hours as needed. 02/06/24  Yes Becky Augusta, NP  amLODipine (NORVASC) 5 MG tablet Take 1 tablet (5 mg total) by mouth daily. 12/14/21  Yes McLean-Scocuzza, Pasty Spillers, MD  benzonatate (TESSALON) 100 MG capsule Take 2 capsules (200 mg total) by mouth every 8 (eight) hours. 02/06/24  Yes Becky Augusta, NP  citalopram (CELEXA) 10 MG tablet TAKE 1 TABLET BY MOUTH EVERY DAY 01/27/24  Yes Dana Allan, MD  ezetimibe (ZETIA) 10 MG tablet Take 1 tablet (10 mg total) by mouth daily. 12/14/21  Yes McLean-Scocuzza, Pasty Spillers, MD  hydrOXYzine (VISTARIL) 25 MG capsule TAKE 1 CAPSULE(25 MG) BY MOUTH EVERY 8 HOURS AS NEEDED 10/08/23  Yes Kara Dies, NP  ipratropium (ATROVENT) 0.06 % nasal spray Place 2 sprays into both nostrils 4 (four) times daily. 02/06/24  Yes Becky Augusta, NP  methimazole (TAPAZOLE) 5 MG tablet Take 10 mg by mouth daily. 03/05/23  Yes [provider]  olmesartan-hydrochlorothiazide (BENICAR HCT) 20-12.5 MG tablet TAKE 1 TABLET BY MOUTH DAILY IN THE MORNING 07/16/23  Yes Dana Allan, MD  promethazine-dextromethorphan (PROMETHAZINE-DM) 6.25-15 MG/5ML syrup Take 5 mLs by mouth 4 (four) times daily as needed. 02/06/24  Yes Becky Augusta, NP  rosuvastatin (CRESTOR) 10 MG tablet Take 1 tablet (10 mg total) by  mouth daily. 08/15/23  Yes Dana Allan, MD  Spacer/Aero-Holding Chambers (AEROCHAMBER MV) inhaler Use as instructed 02/06/24  Yes Becky Augusta, NP  atenolol (TENORMIN) 25 MG tablet    Yes [provider]  atorvastatin (LIPITOR) 10 MG tablet     [provider]  azelastine (ASTELIN) 0.1 % nasal spray Place 2 sprays into both nostrils 2 (two) times daily. Use in each nostril as directed prn 10/31/20   McLean-Scocuzza, Pasty Spillers, MD  Blood Pressure KIT 1 Device by Does not apply route daily. BP cuff adult large size 10/31/20   McLean-Scocuzza, Pasty Spillers, MD  buPROPion (WELLBUTRIN XL) 150 MG 24 hr tablet     [provider]  Cholecalciferol (VITAMIN D) 50 MCG (2000 UT) CAPS Take 2,000 Units by mouth every 7 (seven) days.    [provider]  fexofenadine (ALLEGRA ALLERGY) 180 MG tablet Take 1 tablet (180 mg total) by mouth daily. 07/16/23   Dana Allan, MD  fluticasone (FLONASE) 50 MCG/ACT  nasal spray Place 2 sprays into both nostrils daily. 07/16/23   Dana Allan, MD  JARDIANCE 10 MG TABS tablet Take 10 mg by mouth daily.   Yes [provider]    Family History Family History  Problem Relation Age of Onset   Breast cancer Mother 92   Diabetes Mother    Kidney failure Father    Alcohol abuse Father    Heart disease Father        chf   Hyperlipidemia Father    Lung cancer Maternal Aunt    Autism Grandson    Other Other        fatty liver cousin   Sickle cell anemia Other        nephew   Cancer Other        aunt with ovarian cancer    Social History Social History   Tobacco Use   Smoking status: Never   Smokeless tobacco: Never  Vaping Use   Vaping status: Never Used  Substance Use Topics   Alcohol use: Never   Drug use: Never     Allergies   Sulfa antibiotics, Wheat, Belsomra [suvorexant], Melatonin, Ambien [zolpidem tartrate], Aspirin, Prednisone, Shellfish allergy, and Strawberry extract   Review of Systems Review of Systems   Constitutional:  Negative for fever.  HENT:  Positive for congestion, ear pain, postnasal drip, rhinorrhea, sinus pressure and sore throat.   Respiratory:  Positive for cough and shortness of breath. Negative for wheezing.   Gastrointestinal:  Negative for diarrhea, nausea and vomiting.     Physical Exam Triage Vital Signs ED Triage Vitals  Encounter Vitals Group     BP 02/06/24 1012 134/86     Systolic BP Percentile --      Diastolic BP Percentile --      Pulse Rate 02/06/24 1012 (!) 105     Resp 02/06/24 1012 17     Temp 02/06/24 1012 98.7 F (37.1 C)     Temp Source 02/06/24 1012 Oral     SpO2 02/06/24 1012 98 %     Weight --      Height --      Head Circumference --      Peak Flow --      Pain Score 02/06/24 1010 8     Pain Loc --      Pain Education --      Exclude from Growth Chart --    No data found.  Updated Vital Signs BP 134/86 (BP Location: Right Arm)   Pulse (!) 105   Temp 98.7 F (37.1 C) (Oral)   Resp 17   SpO2 98%   Visual Acuity Right Eye Distance:   Left Eye Distance:   Bilateral Distance:    Right Eye Near:   Left Eye Near:    Bilateral Near:     Physical Exam Vitals and nursing note reviewed.  Constitutional:      Appearance: Normal appearance. She is not ill-appearing.  HENT:     Head: Normocephalic and atraumatic.     Right Ear: Ear canal and external ear normal. There is no impacted cerumen.     Left Ear: Ear canal and external ear normal. There is no impacted cerumen.     Ears:     Comments: Moderate cerumen in both external auditory canals with erythema of the visible tympanic membranes.    Nose: Congestion and rhinorrhea present.     Comments: Nasal mucosa is erythematous and edematous with yellow  discharge in both nares.    Mouth/Throat:     Mouth: Mucous membranes are moist.     Pharynx: Oropharynx is clear. Posterior oropharyngeal erythema present. No oropharyngeal exudate.     Comments: Erythema to the posterior pharynx  with yellow postnasal drip. Cardiovascular:     Rate and Rhythm: Normal rate and regular rhythm.     Pulses: Normal pulses.     Heart sounds: Normal heart sounds. No murmur heard.    No friction rub. No gallop.  Pulmonary:     Effort: Pulmonary effort is normal.     Breath sounds: Wheezing present. No rhonchi or rales.     Comments: Scattered expiratory wheezing present. Musculoskeletal:     Cervical back: Normal range of motion and neck supple. No tenderness.  Lymphadenopathy:     Cervical: No cervical adenopathy.  Skin:    General: Skin is warm and dry.     Capillary Refill: Capillary refill takes less than 2 seconds.  Neurological:     General: No focal deficit present.     Mental Status: She is alert and oriented to person, place, and time.      UC Treatments / Results  Labs (all labs ordered are listed, but only abnormal results are displayed) Labs Reviewed - No data to display  EKG   Radiology No results found.  Procedures Procedures (including critical care time)  Medications Ordered in UC Medications - No data to display  Initial Impression / Assessment and Plan / UC Course  I have reviewed the triage vital signs and the nursing notes.  Pertinent labs & imaging results that were available during my care of the patient were reviewed by me and considered in my medical decision making (see chart for details).   Patient is a pleasant, nontoxic-appearing 68 year old female presenting for evaluation of 6 days with the respiratory symptoms outlined HPI above.  In the exam room she is able to speak in full sentences, though speaking does trigger a coughing spell.  She is afebrile with an oral temp of 98.7.  Respiratory at triage was 17 with a 98% room oxygen saturation.  Her physical exam does reveal inflammation of her upper respiratory tract and she has expiratory wheezing diffusely.  I suspect the patient may have contracted COVID on her trip, however she is on day 6  of symptoms is outside the therapeutic window for antivirals.  She is experiencing bloody discharge.  Given that she has had symptoms for a week a trial of antibiotics is warranted.  I will discharge her home on Augmentin 875 mg twice daily with food for 7 days.  Atrovent Nasabid open nasal congestion.  Tessalon Perles and Promethazine DM cough syrup for cough and congestion.  I will also prescribe an albuterol inhaler with a spacer and she can use 1 to 2 puffs every 4-6 hours as needed for wheezing or shortness of breath.  Return precautions reviewed.  Work note provided.   Final Clinical Impressions(s) / UC Diagnoses   Final diagnoses:  URI with cough and congestion     Discharge Instructions      You have been diagnosed today with a URI with cough and congestion.  Please use over-the-counter Tylenol and/or ibuprofen according to pack instructions as needed for any fever or pain.  Take the Augmentin 875 mg twice daily with food for 7 days for treatment of your URI.  Use the albuterol inhaler, with the spacer, and use 1 to 2 puffs every  4-6 hours as needed for any shortness of breath or wheezing.  Use the Atrovent nasal spray, 2 squirts in each nostril every 6 hours, as needed for runny nose and postnasal drip.  Use the Tessalon Perles every 8 hours during the day.  Take them with a small sip of water.  They may give you some numbness to the base of your tongue or a metallic taste in your mouth, this is normal.  Use the Promethazine DM cough syrup at bedtime for cough and congestion.  It will make you drowsy so do not take it during the day.  Return for reevaluation or see your primary care provider for any new or worsening symptoms.      ED Prescriptions     Medication Sig Dispense Auth. Provider   Spacer/Aero-Holding Chambers (AEROCHAMBER MV) inhaler Use as instructed 1 each Becky Augusta, NP   albuterol (VENTOLIN HFA) 108 (90 Base) MCG/ACT inhaler Inhale 2 puffs into the lungs  every 4 (four) hours as needed. 18 g Becky Augusta, NP   benzonatate (TESSALON) 100 MG capsule Take 2 capsules (200 mg total) by mouth every 8 (eight) hours. 21 capsule Becky Augusta, NP   ipratropium (ATROVENT) 0.06 % nasal spray Place 2 sprays into both nostrils 4 (four) times daily. 15 mL Becky Augusta, NP   promethazine-dextromethorphan (PROMETHAZINE-DM) 6.25-15 MG/5ML syrup Take 5 mLs by mouth 4 (four) times daily as needed. 118 mL Becky Augusta, NP      PDMP not reviewed this encounter.   Becky Augusta, NP 02/06/24 1043

## 2024-02-11 ENCOUNTER — Telehealth: Payer: Self-pay | Admitting: Family Medicine

## 2024-02-11 NOTE — Telephone Encounter (Signed)
 Copied from CRM [id number]. Topic: Appointments - Scheduling Inquiry for Clinic >> Feb 11, 2024  2:32 PM Denese Killings wrote: Reason for CRM: Patient wants to schedule a colonoscopy.

## 2024-02-11 NOTE — Telephone Encounter (Signed)
 Ok to put in referral for colonoscopy

## 2024-02-12 ENCOUNTER — Telehealth: Payer: Self-pay

## 2024-02-12 ENCOUNTER — Other Ambulatory Visit: Payer: Self-pay

## 2024-02-12 DIAGNOSIS — Z1231 Encounter for screening mammogram for malignant neoplasm of breast: Secondary | ICD-10-CM

## 2024-02-12 DIAGNOSIS — Z1211 Encounter for screening for malignant neoplasm of colon: Secondary | ICD-10-CM

## 2024-02-12 NOTE — Telephone Encounter (Signed)
 Gastroenterology Pre-Procedure Review  Request Date: Due After 06/09/24 Requesting Physician: Dr. Servando Snare  PATIENT REVIEW QUESTIONS: The patient responded to the following health history questions as indicated:    1. Are you having any GI issues? no 2. Do you have a personal history of Polyps? yes (last colonoscopy 06/10/19 performed by Dr. Servando Snare recommended repeat in 5 years) 3. Do you have a family history of Colon Cancer or Polyps? no 4. Diabetes Mellitus? yes (takes [place] Pepper will need to stop 3 days prior to colonoscopy) 5. Joint replacements in the past 12 months?no 6. Major health problems in the past 3 months?no 7. Any artificial heart valves, MVP, or defibrillator?no    MEDICATIONS & ALLERGIES:    Patient reports the following regarding taking any anticoagulation/antiplatelet therapy:   Plavix, Coumadin, Eliquis, Xarelto, Lovenox, Pradaxa, Brilinta, or Effient? no Aspirin? no  Patient confirms/reports the following medications:  Current Outpatient Medications  Medication Sig Dispense Refill   albuterol (VENTOLIN HFA) 108 (90 Base) MCG/ACT inhaler Inhale 2 puffs into the lungs every 4 (four) hours as needed. 18 g 0   amLODipine (NORVASC) 5 MG tablet Take 1 tablet (5 mg total) by mouth daily. 90 tablet 3   atenolol (TENORMIN) 25 MG tablet      atorvastatin (LIPITOR) 10 MG tablet      azelastine (ASTELIN) 0.1 % nasal spray Place 2 sprays into both nostrils 2 (two) times daily. Use in each nostril as directed prn 30 mL 0   benzonatate (TESSALON) 100 MG capsule Take 2 capsules (200 mg total) by mouth every 8 (eight) hours. 21 capsule 0   Blood Pressure KIT 1 Device by Does not apply route daily. BP cuff adult large size 1 kit 0   buPROPion (WELLBUTRIN XL) 150 MG 24 hr tablet      Cholecalciferol (VITAMIN D) 50 MCG (2000 UT) CAPS Take 2,000 Units by mouth every 7 (seven) days.     citalopram (CELEXA) 10 MG tablet TAKE 1 TABLET BY MOUTH EVERY DAY 30 tablet 4   ezetimibe (ZETIA) 10 MG  tablet Take 1 tablet (10 mg total) by mouth daily. 90 tablet 3   fexofenadine (ALLEGRA ALLERGY) 180 MG tablet Take 1 tablet (180 mg total) by mouth daily. 180 tablet 1   fluticasone (FLONASE) 50 MCG/ACT nasal spray Place 2 sprays into both nostrils daily. 16 g 6   hydrOXYzine (VISTARIL) 25 MG capsule TAKE 1 CAPSULE(25 MG) BY MOUTH EVERY 8 HOURS AS NEEDED 30 capsule 0   ipratropium (ATROVENT) 0.06 % nasal spray Place 2 sprays into both nostrils 4 (four) times daily. 15 mL 12   JARDIANCE 10 MG TABS tablet Take 10 mg by mouth daily.     methimazole (TAPAZOLE) 5 MG tablet Take 10 mg by mouth daily.     olmesartan-hydrochlorothiazide (BENICAR HCT) 20-12.5 MG tablet TAKE 1 TABLET BY MOUTH DAILY IN THE MORNING 90 tablet 1   promethazine-dextromethorphan (PROMETHAZINE-DM) 6.25-15 MG/5ML syrup Take 5 mLs by mouth 4 (four) times daily as needed. 118 mL 0   rosuvastatin (CRESTOR) 10 MG tablet Take 1 tablet (10 mg total) by mouth daily. 90 tablet 3   Spacer/Aero-Holding Chambers (AEROCHAMBER MV) inhaler Use as instructed 1 each 2   No current facility-administered medications for this visit.    Patient confirms/reports the following allergies:  Allergies  Allergen Reactions   Sulfa Antibiotics Itching, Swelling and Other (See Comments)   Wheat Other (See Comments)    bloating   Belsomra [Suvorexant]  Nightmares    Melatonin     Night mares     Ambien [Zolpidem Tartrate] Other (See Comments)    Nightmares    Aspirin Other (See Comments)    Gastric irritation   Prednisone Palpitations   Shellfish Allergy Itching   Strawberry Extract Itching    No orders of the defined types were placed in this encounter.   AUTHORIZATION INFORMATION Primary Insurance: 1D#: Group #:  Secondary Insurance: 1D#: Group #:  SCHEDULE INFORMATION: Date: TBD Time: Location: [facility]

## 2024-02-12 NOTE — Telephone Encounter (Signed)
 Called and informed pt to call [facility] office and get that scheduled. I also provider the number. Pt requested a order for a mammography and I have placed the order for the pt.

## 2024-03-26 ENCOUNTER — Encounter: Payer: Self-pay | Admitting: Family Medicine

## 2024-03-26 ENCOUNTER — Ambulatory Visit: Admitting: Family Medicine

## 2024-03-26 VITALS — BP 116/70 | HR 85 | Temp 98.3°F | Resp 20 | Ht 66.0 in | Wt 187.4 lb

## 2024-03-26 DIAGNOSIS — E1169 Type 2 diabetes mellitus with other specified complication: Secondary | ICD-10-CM | POA: Diagnosis not present

## 2024-03-26 DIAGNOSIS — E559 Vitamin D deficiency, unspecified: Secondary | ICD-10-CM

## 2024-03-26 DIAGNOSIS — I152 Hypertension secondary to endocrine disorders: Secondary | ICD-10-CM

## 2024-03-26 DIAGNOSIS — Z7984 Long term (current) use of oral hypoglycemic drugs: Secondary | ICD-10-CM

## 2024-03-26 DIAGNOSIS — G8929 Other chronic pain: Secondary | ICD-10-CM | POA: Diagnosis not present

## 2024-03-26 DIAGNOSIS — E1159 Type 2 diabetes mellitus with other circulatory complications: Secondary | ICD-10-CM

## 2024-03-26 DIAGNOSIS — E538 Deficiency of other specified B group vitamins: Secondary | ICD-10-CM

## 2024-03-26 DIAGNOSIS — N1831 Chronic kidney disease, stage 3a: Secondary | ICD-10-CM

## 2024-03-26 DIAGNOSIS — E785 Hyperlipidemia, unspecified: Secondary | ICD-10-CM

## 2024-03-26 DIAGNOSIS — E118 Type 2 diabetes mellitus with unspecified complications: Secondary | ICD-10-CM

## 2024-03-26 DIAGNOSIS — M546 Pain in thoracic spine: Secondary | ICD-10-CM | POA: Diagnosis not present

## 2024-03-26 DIAGNOSIS — R8290 Unspecified abnormal findings in urine: Secondary | ICD-10-CM

## 2024-03-26 LAB — POCT URINALYSIS DIP (CLINITEK)
Bilirubin, UA: NEGATIVE
Glucose, UA: 250 mg/dL — AB
Ketones, POC UA: NEGATIVE mg/dL
Nitrite, UA: POSITIVE — AB
POC PROTEIN,UA: 100 — AB
Spec Grav, UA: 1.015 (ref 1.010–1.025)
Urobilinogen, UA: 0.2 U/dL
pH, UA: 6 (ref 5.0–8.0)

## 2024-03-26 MED ORDER — TIZANIDINE HCL 4 MG PO TABS: 2.0000 mg | ORAL_TABLET | Freq: Four times a day (QID) | ORAL | 0 refills | Status: DC | PRN

## 2024-03-26 MED ORDER — NITROFURANTOIN MONOHYD MACRO 100 MG PO CAPS: 100.0000 mg | ORAL_CAPSULE | Freq: Two times a day (BID) | ORAL | 0 refills | Status: AC

## 2024-03-26 NOTE — Progress Notes (Signed)
 SUBJECTIVE:   Chief Complaint  Patient presents with   Back Pain    Started Tuesday on both side   HPI Presents for acute visit  Discussed the use of AI scribe software for clinical note transcription with the patient, who gave verbal consent to proceed.  History of Present Illness A 68 year old female with scoliosis presents with upper back pain and sleep disturbances.  She has been experiencing upper back pain for the last couple of weeks, described as an ache located in the upper back, near the belly. The pain is not associated with any specific injury or activity and seems to worsen after drinking coffee. No pain with breathing in or out. The pain has improved since Tuesday of this week. She works at a [employer] and denies any lifting, twisting, or other physical activities that could have caused the pain. She has a history of scoliosis, which causes a mild curvature in her back. She believes her posture might contribute to the pain, as she often sits in a non-ergonomic chair at work and tends to sit with poor posture.  She reports significant sleep disturbances, sleeping only two hours a day and often waking up to use the bathroom. She has tried to stop drinking fluids after 7 PM, which has helped somewhat.  She had a sinus infection [place], leading to a persistent cough for about two months. She was treated with antibiotics after an initial visit to urgent care, where chest x-rays were taken to rule out pneumonia.  She has a history of high cholesterol and diabetes. She previously stopped taking a cholesterol medication due to leg pain but is currently on Zetia , which does not cause discomfort. She is concerned about her cholesterol levels and plans to have blood work done before the end of June.    PERTINENT PMH / PSH: As above  OBJECTIVE:  BP 116/70   Pulse 85   Temp 98.3 F (36.8 C)   Resp 20   Ht 5\' 6"  (1.676 m)   Wt 187 lb 6 oz (85  kg)   SpO2 98%   BMI 30.24 kg/m    Physical Exam Vitals reviewed.  Constitutional:      General: She is not in acute distress.    Appearance: Normal appearance. She is normal weight. She is not ill-appearing, toxic-appearing or diaphoretic.  Eyes:     General:        Right eye: No discharge.        Left eye: No discharge.     Conjunctiva/sclera: Conjunctivae normal.  Cardiovascular:     Rate and Rhythm: Normal rate and regular rhythm.     Heart sounds: Normal heart sounds.  Pulmonary:     Effort: Pulmonary effort is normal.     Breath sounds: Normal breath sounds.  Abdominal:     General: Bowel sounds are normal.  Musculoskeletal:        General: Normal range of motion.     Cervical back: Normal.     Thoracic back: Normal.     Lumbar back: Spasms and tenderness present. No bony tenderness. Negative right straight leg raise test and negative left straight leg raise test.  Skin:    General: Skin is warm and dry.  Neurological:     General: No focal deficit present.     Mental Status: She is alert and oriented to person, place, and time. Mental status is at baseline.  Psychiatric:  Mood and Affect: Mood normal.        Behavior: Behavior normal.        Thought Content: Thought content normal.        Judgment: Judgment normal.           03/26/2024    1:42 PM 08/29/2023    3:17 PM 07/16/2023    2:01 PM 12/13/2021    3:17 PM 07/25/2020   11:47 AM  Depression screen PHQ 2/9  Decreased Interest 0 0 2 0 0  Down, Depressed, Hopeless 0 0 2 0 1  PHQ - 2 Score 0 0 4 0 1  Altered sleeping 0 0 2  0  Tired, decreased energy 0 0 2  0  Change in appetite 0 0 2  0  Feeling bad or failure about yourself  0 0 0  0  Trouble concentrating 0 0 0  0  Moving slowly or fidgety/restless 0 0 0  0  Suicidal thoughts 0 0 0  0  PHQ-9 Score 0 0 10  1  Difficult doing work/chores Somewhat difficult Not difficult at all Not difficult at all  Not difficult at all      03/26/2024    1:42  PM 08/29/2023    3:18 PM 07/16/2023    2:02 PM 07/25/2020   11:47 AM  GAD 7 : Generalized Anxiety Score  Nervous, Anxious, on Edge 0 0 1 0  Control/stop worrying 0 0 0 0  Worry too much - different things 0 0 1 0  Trouble relaxing 0 0 1 0  Restless 0 0 0 0  Easily annoyed or irritable 0 0 1 0  Afraid - awful might happen 0 0 0 0  Total GAD 7 Score 0 0 4 0  Anxiety Difficulty Not difficult at all Not difficult at all Not difficult at all Not difficult at all    ASSESSMENT/PLAN:  Chronic bilateral thoracic back pain Assessment & Plan: Intermittent upper back pain linked to scoliosis, poor posture, and prolonged sitting. Possible muscle strain from recent coughing. - Prescribe Zanaflex  2-4 mg q6h prn - Recommend physical therapy exercises. - Suggest heat and ice therapy. - Consider diclofenac gel, applied by her son. - Discuss OTC options: capsaicin, Biofreeze, lidocaine  patches. - Encourage good posture and ergonomic adjustments. - Obtain urine sample to rule out UTI.  Orders: -     POCT URINALYSIS DIP (CLINITEK) -     tiZANidine  HCl; Take 0.5 tablets (2 mg total) by mouth every 6 (six) hours as needed for muscle spasms.  Dispense: 30 tablet; Refill: 0 -     CK  Hypertension associated with diabetes (HCC) Assessment & Plan: Well controlled Continue Amlodipine  5 mg daily Continue Benicar  20-12.5 mg daily Check Cmet    Type 2 diabetes mellitus with complications (HCC) -     CBC with Differential/Platelet -     Comprehensive metabolic panel with GFR -     Microalbumin / creatinine urine ratio  Hyperlipidemia associated with type 2 diabetes mellitus (HCC) Assessment & Plan: Chronic. Self discontinued Crestor  and Zetia  due to concern for side effects -Restart Zetia  10 mg daily -Repatha considered if levels remain high. -Check CK and Cmet  Orders: -     Lipid panel  Stage 3a chronic kidney disease (HCC) -     Hemoglobin A1c  Vitamin D  deficiency -     VITAMIN D  25  Hydroxy (Vit-D Deficiency, Fractures)  Vitamin B 12 deficiency -  Vitamin B12  Abnormal urinalysis Assessment & Plan: POC urine positive for nitrites and leuks Send for urine culture Given back pain will start Macrobid  BID x 5 days Start Probiotics daily  Orders: -     Urine Culture -     Urinalysis, Routine w reflex microscopic -     Nitrofurantoin  Monohyd Macro; Take 1 capsule (100 mg total) by mouth 2 (two) times daily for 5 days.  Dispense: 10 capsule; Refill: 0 -     Microscopic Examination      PDMP reviewed  Return if symptoms worsen or fail to improve, for PCP.  Valli Gaw, MD

## 2024-03-26 NOTE — Patient Instructions (Addendum)
 It was a pleasure meeting you today. Thank you for allowing me to take part in your health care.  Our goals for today as we discussed include:  Start Zanaflex 2 mg at night. Can increase to 4 mg if tolerating well.    Tylenol 500 mg every 6 hours as needed Lidocaine patch every 12 hours as needed Diclofenac gel every 6 hours as needed Heat/Ice as needed Massage therapy Can refer to physical therapy if no improvement   Please call [facility] ask if still scheduling colonoscopy for June.   Please notify MD if needing referral   Fast for 10 hours  Urine positive for nitrates and white blood cells Will send for further evaluation Start Macrobid 2 times a day  Start Probiotics daily and continue for 14 days after treatment.    This is a list of the screening recommended for you and due dates:  Health Maintenance  Topic Date Due   DTaP/Tdap/Td vaccine (1 - Tdap) Never done   Zoster (Shingles) Vaccine (1 of 2) Never done   Hemoglobin A1C  01/31/2024   Colon Cancer Screening  06/09/2024   Pneumonia Vaccine (1 of 2 - PCV) 08/01/2024*   Flu Shot  07/16/2024   COVID-19 Vaccine (5 - Pfizer risk 2024-25 season) 07/22/2024   Yearly kidney function blood test for diabetes  07/30/2024   Yearly kidney health urinalysis for diabetes  07/30/2024   Mammogram  09/01/2024   DEXA scan (bone density measurement)  Completed   Hepatitis C Screening  Completed   HPV Vaccine  Aged Out   Meningitis B Vaccine  Aged Out   Complete foot exam   Discontinued   Eye exam for diabetics  Discontinued  *Topic was postponed. The date shown is not the original due date.      If you have any questions or concerns, please do not hesitate to call the office at [phone].  I look forward to our next visit and until then take care and stay safe.  Regards,   Dana Allan, MD   [facility]

## 2024-03-27 LAB — MICROSCOPIC EXAMINATION
Casts: NONE SEEN /LPF
WBC, UA: >30 /HPF — AB (ref 0–5)

## 2024-03-27 LAB — MICROALBUMIN / CREATININE URINE RATIO
Creatinine, Urine: 38.4 mg/dL
Microalb/Creat Ratio: 577 mg/g{creat} — ABNORMAL HIGH (ref 0–29)
Microalbumin, Urine: 221.6 ug/mL

## 2024-03-27 LAB — URINALYSIS, ROUTINE W REFLEX MICROSCOPIC
Bilirubin, UA: NEGATIVE
Ketones, UA: NEGATIVE
Nitrite, UA: NEGATIVE
Specific Gravity, UA: 1.012 (ref 1.005–1.030)
Urobilinogen, Ur: 0.2 mg/dL (ref 0.2–1.0)
pH, UA: 6 (ref 5.0–7.5)

## 2024-03-28 LAB — URINE CULTURE
MICRO NUMBER:: 16319506
SPECIMEN QUALITY:: ADEQUATE

## 2024-04-03 LAB — COMPREHENSIVE METABOLIC PANEL WITH GFR
ALT: 14 IU/L (ref 0–32)
AST: 14 IU/L (ref 0–40)
Albumin: 4.2 g/dL (ref 3.9–4.9)
Alkaline Phosphatase: 172 IU/L — ABNORMAL HIGH (ref 44–121)
BUN/Creatinine Ratio: 16 (ref 12–28)
BUN: 24 mg/dL (ref 8–27)
Bilirubin Total: 0.3 mg/dL (ref 0.0–1.2)
CO2: 22 mmol/L (ref 20–29)
Calcium: 9.9 mg/dL (ref 8.7–10.3)
Chloride: 104 mmol/L (ref 96–106)
Creatinine, Ser: 1.52 mg/dL — ABNORMAL HIGH (ref 0.57–1.00)
Globulin, Total: 3.1 g/dL (ref 1.5–4.5)
Glucose: 95 mg/dL (ref 70–99)
Potassium: 4.1 mmol/L (ref 3.5–5.2)
Sodium: 142 mmol/L (ref 134–144)
Total Protein: 7.3 g/dL (ref 6.0–8.5)
eGFR: 38 mL/min/{1.73_m2} — ABNORMAL LOW (ref >59)

## 2024-04-03 LAB — CBC WITH DIFFERENTIAL/PLATELET
Basophils Absolute: 0.1 10*3/uL (ref 0.0–0.2)
Basos: 1 %
EOS (ABSOLUTE): 0.7 10*3/uL — ABNORMAL HIGH (ref 0.0–0.4)
Eos: 7 %
Hematocrit: 37.5 % (ref 34.0–46.6)
Hemoglobin: 11.5 g/dL (ref 11.1–15.9)
Immature Grans (Abs): 0 10*3/uL (ref 0.0–0.1)
Immature Granulocytes: 0 %
Lymphocytes Absolute: 2.7 10*3/uL (ref 0.7–3.1)
Lymphs: 29 %
MCH: 27.1 pg (ref 26.6–33.0)
MCHC: 30.7 g/dL — ABNORMAL LOW (ref 31.5–35.7)
MCV: 88 fL (ref 79–97)
Monocytes Absolute: 0.7 10*3/uL (ref 0.1–0.9)
Monocytes: 8 %
Neutrophils Absolute: 5.1 10*3/uL (ref 1.4–7.0)
Neutrophils: 55 %
Platelets: 299 10*3/uL (ref 150–450)
RBC: 4.25 x10E6/uL (ref 3.77–5.28)
RDW: 15.4 % (ref 11.7–15.4)
WBC: 9.3 10*3/uL (ref 3.4–10.8)

## 2024-04-03 LAB — LIPID PANEL
Chol/HDL Ratio: 3.4 ratio (ref 0.0–4.4)
Cholesterol, Total: 196 mg/dL (ref 100–199)
HDL: 57 mg/dL (ref >39)
LDL Chol Calc (NIH): 113 mg/dL — ABNORMAL HIGH (ref 0–99)
Triglycerides: 150 mg/dL — ABNORMAL HIGH (ref 0–149)
VLDL Cholesterol Cal: 26 mg/dL (ref 5–40)

## 2024-04-03 LAB — VITAMIN B12: Vitamin B-12: 512 pg/mL (ref 232–1245)

## 2024-04-03 LAB — VITAMIN D 25 HYDROXY (VIT D DEFICIENCY, FRACTURES): Vit D, 25-Hydroxy: 35.4 ng/mL (ref 30.0–100.0)

## 2024-04-03 LAB — CK: Total CK: 80 U/L (ref 32–182)

## 2024-04-03 LAB — HEMOGLOBIN A1C
Est. average glucose Bld gHb Est-mCnc: 131 mg/dL
Hgb A1c MFr Bld: 6.2 % — ABNORMAL HIGH (ref 4.8–5.6)

## 2024-04-05 ENCOUNTER — Encounter: Payer: Self-pay | Admitting: Family Medicine

## 2024-04-05 DIAGNOSIS — G8929 Other chronic pain: Secondary | ICD-10-CM | POA: Insufficient documentation

## 2024-04-05 DIAGNOSIS — R8290 Unspecified abnormal findings in urine: Secondary | ICD-10-CM | POA: Insufficient documentation

## 2024-04-05 DIAGNOSIS — E538 Deficiency of other specified B group vitamins: Secondary | ICD-10-CM | POA: Insufficient documentation

## 2024-04-05 NOTE — Assessment & Plan Note (Signed)
 Intermittent upper back pain linked to scoliosis, poor posture, and prolonged sitting. Possible muscle strain from recent coughing. - Prescribe Zanaflex  2-4 mg q6h prn - Recommend physical therapy exercises. - Suggest heat and ice therapy. - Consider diclofenac gel, applied by her son. - Discuss OTC options: capsaicin, Biofreeze, lidocaine  patches. - Encourage good posture and ergonomic adjustments. - Obtain urine sample to rule out UTI.

## 2024-04-05 NOTE — Assessment & Plan Note (Signed)
 Well controlled Continue Amlodipine  5 mg daily Continue Benicar  20-12.5 mg daily Check Cmet

## 2024-04-05 NOTE — Assessment & Plan Note (Addendum)
 Chronic. Self discontinued Crestor  and Zetia  due to concern for side effects -Restart Zetia  10 mg daily -Repatha considered if levels remain high. -Check CK and Cmet

## 2024-04-05 NOTE — Assessment & Plan Note (Signed)
 POC urine positive for nitrites and leuks Send for urine culture Given back pain will start Macrobid  BID x 5 days Start Probiotics daily

## 2024-04-06 ENCOUNTER — Other Ambulatory Visit: Payer: Self-pay | Admitting: Family Medicine

## 2024-04-06 DIAGNOSIS — E785 Hyperlipidemia, unspecified: Secondary | ICD-10-CM

## 2024-04-06 MED ORDER — EZETIMIBE 10 MG PO TABS: 10.0000 mg | ORAL_TABLET | Freq: Every day | ORAL | 3 refills | Status: AC

## 2024-04-20 ENCOUNTER — Other Ambulatory Visit: Payer: Self-pay | Admitting: Family Medicine

## 2024-04-20 DIAGNOSIS — E118 Type 2 diabetes mellitus with unspecified complications: Secondary | ICD-10-CM

## 2024-04-29 ENCOUNTER — Encounter: Payer: Self-pay | Admitting: Family Medicine

## 2024-04-29 ENCOUNTER — Ambulatory Visit: Admitting: Family Medicine

## 2024-04-29 DIAGNOSIS — G8929 Other chronic pain: Secondary | ICD-10-CM

## 2024-04-29 DIAGNOSIS — M546 Pain in thoracic spine: Secondary | ICD-10-CM | POA: Diagnosis not present

## 2024-04-29 MED ORDER — TIZANIDINE HCL 4 MG PO TABS: 2.0000 mg | ORAL_TABLET | Freq: Four times a day (QID) | ORAL | 1 refills | Status: AC | PRN

## 2024-04-29 NOTE — Patient Instructions (Signed)
 It was a pleasure meeting you today. Thank you for allowing me to take part in your health care.  Our goals for today as we discussed include:  Refill sent for Zanaflex   Tylenol  500 mg every 6 hours as needed Lidocaine  patch every 12 hours as needed Diclofenac gel every 6 hours as needed Heat/Ice as needed Massage therapy   This is a list of the screening recommended for you and due dates:  Health Maintenance  Topic Date Due   DTaP/Tdap/Td vaccine (1 - Tdap) Never done   Zoster (Shingles) Vaccine (1 of 2) Never done   Colon Cancer Screening  06/09/2024   Pneumonia Vaccine (1 of 2 - PCV) 08/01/2024*   Flu Shot  07/16/2024   COVID-19 Vaccine (5 - Pfizer risk 2024-25 season) 07/22/2024   Mammogram  09/01/2024   Hemoglobin A1C  10/02/2024   Yearly kidney health urinalysis for diabetes  03/26/2025   Yearly kidney function blood test for diabetes  04/02/2025   DEXA scan (bone density measurement)  Completed   Hepatitis C Screening  Completed   HPV Vaccine  Aged Out   Meningitis B Vaccine  Aged Out   Complete foot exam   Discontinued   Eye exam for diabetics  Discontinued  *Topic was postponed. The date shown is not the original due date.      If you have any questions or concerns, please do not hesitate to call the office at [phone].  I look forward to our next visit and until then take care and stay safe.  Regards,   Valli Gaw, MD   [facility]

## 2024-04-29 NOTE — Progress Notes (Signed)
 SUBJECTIVE:   Chief Complaint  Patient presents with   Back Pain   HPI Presents for acute visit  Discussed the use of AI scribe software for clinical note transcription with the patient, who gave verbal consent to proceed.  History of Present Illness Kendra Rhodes is a 68 year old female who presents with persistent upper back pain.  She experiences persistent pain in her upper back, specifically on the side and behind the shoulder blade. The pain does not radiate and is not associated with breathing or urination. It has been worse in the past two weeks but shows some improvement recently, being 'better today' compared to previous weeks when it was 'horrible'.  She has not undergone physical therapy for this issue and has not found relief with her current medications. She has tried using heat, which provided some initial relief, and has been using Zanaflex  to help relax the muscles. She mentions that the pain might have originated from sleeping in an awkward position with her three dogs, causing her to sleep in a tense position.  Her daily routine involves sitting at a desk for extended periods, working on a computer, which may contribute to her discomfort. She attempts to mitigate this by getting up and walking around periodically and performing stretches. No urinary symptoms recently, although she had a previous episode where a urine infection was identified despite the absence of symptoms at the time.   PERTINENT PMH / PSH: As above  OBJECTIVE:  BP 134/76   Pulse 90   Temp 98.1 F (36.7 C)   Resp 20   Ht 5\' 6"  (1.676 m)   Wt 191 lb 8 oz (86.9 kg)   SpO2 99%   BMI 30.91 kg/m    Physical Exam Vitals reviewed.  Constitutional:      General: She is not in acute distress.    Appearance: Normal appearance. She is normal weight. She is not ill-appearing, toxic-appearing or diaphoretic.  Eyes:     General:        Right eye: No discharge.        Left eye: No discharge.      Conjunctiva/sclera: Conjunctivae normal.  Cardiovascular:     Rate and Rhythm: Normal rate and regular rhythm.     Heart sounds: Normal heart sounds.  Pulmonary:     Effort: Pulmonary effort is normal.     Breath sounds: Normal breath sounds.  Musculoskeletal:        General: Normal range of motion.     Cervical back: Normal.     Thoracic back: Tenderness present. No bony tenderness. Normal range of motion.     Lumbar back: Normal.       Back:  Skin:    General: Skin is warm and dry.  Neurological:     General: No focal deficit present.     Mental Status: She is alert and oriented to person, place, and time. Mental status is at baseline.  Psychiatric:        Mood and Affect: Mood normal.        Behavior: Behavior normal.        Thought Content: Thought content normal.        Judgment: Judgment normal.           04/29/2024    2:32 PM 03/26/2024    1:42 PM 08/29/2023    3:17 PM 07/16/2023    2:01 PM 12/13/2021    3:17 PM  Depression screen PHQ 2/9  Decreased Interest 0 0 0 2 0  Down, Depressed, Hopeless 0 0 0 2 0  PHQ - 2 Score 0 0 0 4 0  Altered sleeping 1 0 0 2   Tired, decreased energy 0 0 0 2   Change in appetite 0 0 0 2   Feeling bad or failure about yourself  0 0 0 0   Trouble concentrating 0 0 0 0   Moving slowly or fidgety/restless 0 0 0 0   Suicidal thoughts 0 0 0 0   PHQ-9 Score 1 0 0 10   Difficult doing work/chores Not difficult at all Somewhat difficult Not difficult at all Not difficult at all       04/29/2024    2:32 PM 03/26/2024    1:42 PM 08/29/2023    3:18 PM 07/16/2023    2:02 PM  GAD 7 : Generalized Anxiety Score  Nervous, Anxious, on Edge 0 0 0 1  Control/stop worrying 0 0 0 0  Worry too much - different things 0 0 0 1  Trouble relaxing 0 0 0 1  Restless 0 0 0 0  Easily annoyed or irritable 0 0 0 1  Afraid - awful might happen 0 0 0 0  Total GAD 7 Score 0 0 0 4  Anxiety Difficulty Not difficult at all Not difficult at all Not difficult at  all Not difficult at all    ASSESSMENT/PLAN:  Chronic bilateral thoracic back pain Assessment & Plan: Chronic upper back pain, right > left,  likely due to muscle tension from prolonged sitting and poor sleeping posture. Has improved today and able to get some relief with OTC medication.  Musculoskeletal origin supported by tenderness and relief with muscle relaxants. No red flags. - Recommend physical therapy and massage therapy. - Continue Zanaflex  for muscle relaxation. - Advise heat and ice therapy as needed. - Consider lidocaine  patches for localized pain relief.   Orders: -     tiZANidine  HCl; Take 0.5 tablets (2 mg total) by mouth every 6 (six) hours as needed for muscle spasms.  Dispense: 60 tablet; Refill: 1   PDMP reviewed  Return if symptoms worsen or fail to improve, for PCP.  Valli Gaw, MD

## 2024-05-09 ENCOUNTER — Encounter: Payer: Self-pay | Admitting: Family Medicine

## 2024-05-09 NOTE — Assessment & Plan Note (Addendum)
 Chronic upper back pain, right > left,  likely due to muscle tension from prolonged sitting and poor sleeping posture. Has improved today and able to get some relief with OTC medication.  Musculoskeletal origin supported by tenderness and relief with muscle relaxants. No red flags. - Recommend physical therapy and massage therapy. - Continue Zanaflex  for muscle relaxation. - Advise heat and ice therapy as needed. - Consider lidocaine  patches for localized pain relief.

## 2024-05-17 NOTE — Telephone Encounter (Signed)
 Attempted to contact patient to schedule her colonoscopy with Dr. Ole Berkeley.  Her voice mail is currently full. Mychart message sent.  Thanks, [place], CMA

## 2024-05-21 ENCOUNTER — Telehealth: Payer: Self-pay

## 2024-05-21 ENCOUNTER — Other Ambulatory Visit: Payer: Self-pay

## 2024-05-21 DIAGNOSIS — Z860100 Personal history of colon polyps, unspecified: Secondary | ICD-10-CM

## 2024-05-21 MED ORDER — NA SULFATE-K SULFATE-MG SULF 17.5-3.13-1.6 GM/177ML PO SOLN: 1.0000 | Freq: Once | ORAL | 0 refills | Status: AC

## 2024-05-21 NOTE — Telephone Encounter (Signed)
 Gastroenterology Pre-Procedure Review  Request Date: 07/08/24 Requesting Physician: Dr. Ole Berkeley  PATIENT REVIEW QUESTIONS: The patient responded to the following health history questions as indicated:    1. Are you having any GI issues? no 2. Do you have a personal history of Polyps? yes (personal history of colon polyps last colonoscopy performed by Dr. Ole Berkeley 06/10/19 recommended repeat in 5 years) 3. Do you have a family history of Colon Cancer or Polyps? no 4. Diabetes Mellitus? yes (takes jardiance has been advised to stop 3 days prior to colonoscopy) 5. Joint replacements in the past 12 months?no 6. Major health problems in the past 3 months?no 7. Any artificial heart valves, MVP, or defibrillator?no    MEDICATIONS & ALLERGIES:    Patient reports the following regarding taking any anticoagulation/antiplatelet therapy:   Plavix, Coumadin, Eliquis, Xarelto, Lovenox, Pradaxa, Brilinta, or Effient? no Aspirin? no  Patient confirms/reports the following medications:  Current Outpatient Medications  Medication Sig Dispense Refill   albuterol  (VENTOLIN  HFA) 108 (90 Base) MCG/ACT inhaler Inhale 2 puffs into the lungs every 4 (four) hours as needed. 18 g 0   atenolol (TENORMIN) 25 MG tablet      azelastine  (ASTELIN ) 0.1 % nasal spray Place 2 sprays into both nostrils 2 (two) times daily. Use in each nostril as directed prn 30 mL 0   Blood Pressure KIT 1 Device by Does not apply route daily. BP cuff adult large size 1 kit 0   buPROPion (WELLBUTRIN XL) 150 MG 24 hr tablet      Cholecalciferol (VITAMIN D ) 50 MCG (2000 UT) CAPS Take 2,000 Units by mouth every 7 (seven) days.     citalopram  (CELEXA ) 10 MG tablet TAKE 1 TABLET BY MOUTH EVERY DAY 30 tablet 4   ezetimibe  (ZETIA ) 10 MG tablet Take 1 tablet (10 mg total) by mouth daily. 90 tablet 3   fexofenadine  (ALLEGRA  ALLERGY) 180 MG tablet Take 1 tablet (180 mg total) by mouth daily. 180 tablet 1   fluticasone  (FLONASE ) 50 MCG/ACT nasal spray  Place 2 sprays into both nostrils daily. 16 g 6   hydrOXYzine  (VISTARIL ) 25 MG capsule TAKE 1 CAPSULE(25 MG) BY MOUTH EVERY 8 HOURS AS NEEDED 30 capsule 0   ipratropium (ATROVENT ) 0.06 % nasal spray Place 2 sprays into both nostrils 4 (four) times daily. 15 mL 12   JARDIANCE 10 MG TABS tablet Take 10 mg by mouth daily.     methimazole (TAPAZOLE) 5 MG tablet Take 10 mg by mouth daily.     olmesartan -hydrochlorothiazide  (BENICAR  HCT) 20-12.5 MG tablet TAKE 1 TABLET BY MOUTH DAILY IN THE MORNING 90 tablet 1   Spacer/Aero-Holding Chambers (AEROCHAMBER MV) inhaler Use as instructed 1 each 2   tiZANidine  (ZANAFLEX ) 4 MG tablet Take 0.5 tablets (2 mg total) by mouth every 6 (six) hours as needed for muscle spasms. 60 tablet 1   No current facility-administered medications for this visit.    Patient confirms/reports the following allergies:  Allergies  Allergen Reactions   Sulfa Antibiotics Itching, Swelling and Other (See Comments)   Wheat Other (See Comments)    bloating   Belsomra [Suvorexant]     Nightmares    Melatonin     Night mares     Ambien [Zolpidem Tartrate] Other (See Comments)    Nightmares    Aspirin Other (See Comments)    Gastric irritation   Prednisone Palpitations   Shellfish Allergy Itching   Strawberry Extract Itching    No orders of the defined types  were placed in this encounter.   AUTHORIZATION INFORMATION Primary Insurance: 1D#: Group #:  Secondary Insurance: 1D#: Group #:  SCHEDULE INFORMATION: Date: 07/08/24 Time: Location: [facility]

## 2024-06-01 ENCOUNTER — Encounter: Payer: Self-pay | Admitting: Family Medicine

## 2024-06-01 ENCOUNTER — Ambulatory Visit (INDEPENDENT_AMBULATORY_CARE_PROVIDER_SITE_OTHER): Admitting: Family Medicine

## 2024-06-01 VITALS — BP 138/84 | HR 87 | Temp 98.3°F | Resp 20 | Ht 66.0 in | Wt 190.0 lb

## 2024-06-01 DIAGNOSIS — E782 Mixed hyperlipidemia: Secondary | ICD-10-CM

## 2024-06-01 DIAGNOSIS — I10 Essential (primary) hypertension: Secondary | ICD-10-CM

## 2024-06-01 DIAGNOSIS — F419 Anxiety disorder, unspecified: Secondary | ICD-10-CM | POA: Diagnosis not present

## 2024-06-01 DIAGNOSIS — F32A Depression, unspecified: Secondary | ICD-10-CM | POA: Diagnosis not present

## 2024-06-01 MED ORDER — ESCITALOPRAM OXALATE 5 MG PO TABS: 5.0000 mg | ORAL_TABLET | Freq: Every day | ORAL | 1 refills | Status: DC

## 2024-06-01 NOTE — Progress Notes (Signed)
 New Patient Office Visit  Subjective    Patient ID: Kendra Rhodes, female    DOB: [birth year]  Age: 68 y.o. MRN: [id number]  CC:  Chief Complaint  Patient presents with   Medical Management of Chronic Issues    HPI Kendra Rhodes presents to establish care Delightful 68 year old with mixed hyperlipidemia, prediabetes, CKD 3A, hypothyroidism, multinodular goiter, HTN, s/p vaginal hysterectomy and tubal ligation, mammograms are done in September annually, colonoscopy scheduled 07/08/2024, Discussed the use of AI scribe software for clinical note transcription with the patient, who gave verbal consent to proceed.  History of Present Illness   Kendra Rhodes is a 68 year old female with hyperthyroidism, elevated cholesterol, and hypertension who presents with anxiety and sleep disturbances.  She has been experiencing significant anxiety over the past month, primarily due to work-related stress. 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 She previously took citalopram  for anxiety but has noticed a resurgence of symptoms. She has difficulty sleeping, often waking after only a couple of hours, and feels irritated frequently.  She has a history of hyperthyroidism and is currently on methimazole. She also has a multinodular goiter, which does not bother her. Her family history includes thyroid  disease in her mother and daughter.  She has elevated cholesterol and has been prescribed Zetia , which she has not been taking because of left leg pain.  After discussion, she plans to resume the Zetia .  Statins have caused her to have leg pains, particularly in her left leg. She is allergic to aspirin and does not take it.  She has hypertension and monitors her blood pressure at home, reporting readings in the 130s/80s range. She is compliant with her blood pressure medication, which she believes helps maintain these levels.  She is prediabetic with an A1c  of 6.1%. She receives samples of Jardiance from  her Kidney  MD due to insurance coverage issues. She has a family history of kidney disease, with her father having died from ESRD and congestive heart failure.  She reports scoliosis in her upper back, discovered earlier this year. 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  She has a history of allergies and takes Astelin , Zyrtec, and a [place] pink allergy pill prescribed by her doctor. She does not take Singulair .  She has a history of vitamin D  deficiency and scoliosis. She has undergone a vaginal hysterectomy and tubal ligation, though she is unsure of the dates of these procedures.  She follows a pescatarian diet, consuming only fish, and avoids salty foods and sodas. She acknowledges needing to increase her protein intake, as she sometimes feels fatigued after eating insufficiently.   She is a never smoker, does not drink alcohol and does not use illicit substances.  Mother died of breast cancer, mother and sister had diabetes, father had CAD and ESRD, mother and daughter both had thyroid  disease, mother had asthma.      Outpatient Encounter Medications as of 06/01/2024  Medication Sig   albuterol  (VENTOLIN  HFA) 108 (90 Base) MCG/ACT inhaler Inhale 2 puffs into the lungs every 4 (four) hours as needed.   atenolol (TENORMIN) 25 MG tablet    azelastine  (ASTELIN ) 0.1 % nasal spray Place 2 sprays into both nostrils 2 (two) times daily. Use in each nostril as directed prn   Cholecalciferol (VITAMIN D ) 50  MCG (2000 UT) CAPS Take 2,000 Units by mouth every 7 (seven) days.   escitalopram (LEXAPRO) 5 MG tablet Take 1 tablet (5 mg total) by mouth at bedtime.   methimazole (TAPAZOLE) 5 MG tablet Take 10 mg by mouth daily.   olmesartan -hydrochlorothiazide  (BENICAR  HCT) 20-12.5 MG tablet TAKE 1 TABLET BY MOUTH DAILY IN THE MORNING    Spacer/Aero-Holding Chambers (AEROCHAMBER MV) inhaler Use as instructed   tiZANidine  (ZANAFLEX ) 4 MG tablet Take 0.5 tablets (2 mg total) by mouth every 6 (six) hours as needed for muscle spasms.   Blood Pressure KIT 1 Device by Does not apply route daily. BP cuff adult large size   buPROPion (WELLBUTRIN XL) 150 MG 24 hr tablet  (Patient not taking: Reported on 06/01/2024)   ezetimibe  (ZETIA ) 10 MG tablet Take 1 tablet (10 mg total) by mouth daily. (Patient not taking: Reported on 06/01/2024)   fexofenadine  (ALLEGRA  ALLERGY) 180 MG tablet Take 1 tablet (180 mg total) by mouth daily. (Patient not taking: Reported on 06/01/2024)   fluticasone  (FLONASE ) 50 MCG/ACT nasal spray Place 2 sprays into both nostrils daily. (Patient not taking: Reported on 06/01/2024)   hydrOXYzine  (VISTARIL ) 25 MG capsule TAKE 1 CAPSULE(25 MG) BY MOUTH EVERY 8 HOURS AS NEEDED (Patient not taking: Reported on 06/01/2024)   ipratropium (ATROVENT ) 0.06 % nasal spray Place 2 sprays into both nostrils 4 (four) times daily. (Patient not taking: Reported on 06/01/2024)   JARDIANCE 10 MG TABS tablet Take 10 mg by mouth daily. (Patient not taking: Reported on 06/01/2024)   [DISCONTINUED] citalopram  (CELEXA ) 10 MG tablet TAKE 1 TABLET BY MOUTH EVERY DAY (Patient not taking: Reported on 06/01/2024)   No facility-administered encounter medications on file as of 06/01/2024.    Past Medical History:  Diagnosis Date   Anemia    iron deficiency and vitamin d  deficiency   Anxiety    Bladder mass 11/2019   Breast mass 10/2019   bilateral, ? cysts   CKD (chronic kidney disease), stage III (HCC)    COVID-19    01/03/21   Depression    Hypertension    Insomnia    Iron deficiency anemia due to chronic blood loss    Obesity (BMI 30-39.9) 05/02/2020   Pre-diabetes    Sickle cell trait (HCC)    Stage 2 chronic kidney disease 05/02/2020   Thyroid  goiter    Tubular adenoma of colon    Wears contact lenses    Wears dentures    upper and  lower partials, also permanent retainer    Past Surgical History:  Procedure Laterality Date   ABDOMINAL HYSTERECTOMY     for prolapse per pt with mesh ovaries still intact no h/o abnormal pap   COLONOSCOPY WITH PROPOFOL  N/A 06/10/2019   Procedure: COLONOSCOPY WITH PROPOFOL ;  Surgeon: Marnee Sink, MD;  Location: [facility];  Service: Endoscopy;  Laterality: N/A;   CYSTOSCOPY WITH BIOPSY N/A 12/03/2019   Procedure: CYSTOSCOPY WITH bladder BIOPSY;  Surgeon: Lawerence Pressman, MD;  Location: [facility];  Service: Urology;  Laterality: N/A;   CYSTOSCOPY WITH FULGERATION N/A 12/03/2019   Procedure: CYSTOSCOPY WITH FULGERATION;  Surgeon: Lawerence Pressman, MD;  Location: [facility];  Service: Urology;  Laterality: N/A;   ESOPHAGOGASTRODUODENOSCOPY (EGD) WITH PROPOFOL  N/A 06/10/2019   Procedure: ESOPHAGOGASTRODUODENOSCOPY (EGD) WITH PROPOFOL ;  Surgeon: Marnee Sink, MD;  Location: [facility];  Service: Endoscopy;  Laterality: N/A;   EYE SURGERY Bilateral    pterygium, both sides repaired   GIVENS CAPSULE STUDY  N/A 08/24/2019   Procedure: GIVENS CAPSULE STUDY;  Surgeon: Marnee Sink, MD;  Location: [facility];  Service: Endoscopy;  Laterality: N/A;   POLYPECTOMY  06/10/2019   Procedure: POLYPECTOMY;  Surgeon: Marnee Sink, MD;  Location: [facility];  Service: Endoscopy;;    Family History  Problem Relation Age of Onset   Breast cancer Mother 59   Diabetes Mother    Kidney failure Father    Alcohol abuse Father    Heart disease Father        chf   Hyperlipidemia Father    Lung cancer Maternal Aunt    Autism Grandson    Other Other        fatty liver cousin   Sickle cell anemia Other        nephew   Cancer Other        aunt with ovarian cancer    Social History   Socioeconomic History   Marital status: Divorced    Spouse name: Not on file   Number of children: Not on file   Years of education: Not on file   Highest education level: Doctorate  Occupational  History   Not on file  Tobacco Use   Smoking status: Never    Passive exposure: Never   Smokeless tobacco: Never  Vaping Use   Vaping status: Never Used  Substance and Sexual Activity   Alcohol use: Never   Drug use: Never   Sexual activity: Yes    Birth control/protection: Surgical    Comment: Hysterectomy  Other Topics Concern   Not on file  Social History Narrative   Post office employee   2 kids daughter Philemon Braver and son Llesuer Andy Bannister   Works homeless shelter weekends    Works [employer] in [place] as of 04/2020   Social Drivers of Health   Financial Resource Strain: Low Risk  (08/29/2023)   Overall Financial Resource Strain (CARDIA)    Difficulty of Paying Living Expenses: Not hard at all  Food Insecurity: No Food Insecurity (08/29/2023)   Hunger Vital Sign    Worried About Running Out of Food in the Last Year: Never true    Ran Out of Food in the Last Year: Never true  Transportation Needs: No Transportation Needs (08/29/2023)   PRAPARE - Administrator, Civil Service (Medical): No    Lack of Transportation (Non-Medical): No  Physical Activity: Insufficiently Active (08/29/2023)   Exercise Vital Sign    Days of Exercise per Week: 1 day    Minutes of Exercise per Session: 20 min  Stress: No Stress Concern Present (08/29/2023)   [employer] of Occupational Health - Occupational Stress Questionnaire    Feeling of Stress : Only a little  Social Connections: Moderately Integrated (08/29/2023)   Social Connection and Isolation Panel    Frequency of Communication with Friends and Family: More than three times a week    Frequency of Social Gatherings with Friends and Family: More than three times a week    Attends Religious Services: More than 4 times per year    Active Member of [employer] or Organizations: Yes    Attends Engineer, structural: More than 4 times per year    Marital Status: Divorced  Catering manager Violence: Not on file     ROS      Objective   BP 138/84 (BP Location: Right Arm, Patient Position: Sitting, Cuff Size: Normal)   Pulse 87   Temp 98.3 F (  36.8 C) (Oral)   Resp 20   Ht 5' 6 (1.676 m)   Wt 190 lb (86.2 kg)   SpO2 97%   BMI 30.67 kg/m    Physical Exam Vitals and nursing note reviewed.  Constitutional:      Appearance: Normal appearance.  HENT:     Head: Normocephalic and atraumatic.   Eyes:     Conjunctiva/sclera: Conjunctivae normal.    Cardiovascular:     Rate and Rhythm: Normal rate and regular rhythm.  Pulmonary:     Effort: Pulmonary effort is normal.     Breath sounds: Normal breath sounds.   Musculoskeletal:     Right lower leg: No edema.     Left lower leg: No edema.  Feet:     Comments: Effusions of both ankle joints.  Skin:    General: Skin is warm and dry.   Neurological:     Mental Status: She is alert and oriented to person, place, and time.   Psychiatric:        Mood and Affect: Mood normal.        Behavior: Behavior normal.        Thought Content: Thought content normal.        Judgment: Judgment normal.            The 10-year ASCVD risk score (Arnett DK, et al., 2019) is: 24.7%     Assessment & Plan:   1. Anxiety (Primary) Trial of Lexapro 5mg  at bedtime to see if it helps with anxiety, irritability and poor sleep. - escitalopram (LEXAPRO) 5 MG tablet; Take 1 tablet (5 mg total) by mouth at bedtime.  Dispense: 90 tablet; Refill: 1  2.  Mixed hyperlipidemia Asked her to restart Zetia  because this is not associated with myalgias.  If she does not tolerate statins may consider cholestyramine to lower her LDL cholesterol as her 10-year ASCVD risk score is 24.7%.  3.  Constipation Has been using a stimulant plant laxative.  Ask her to stop doing this and try MiraLAX 17 g in 8 ounces of water  daily.  Will not consider cholestyramine until the constipation problem is controlled.      Anxiety Increased anxiety, poor sleep,  irritability, stress. Lexapro suggested as alternative to citalopram . - Prescribe Lexapro 5 mg at bedtime. - Encourage regular exercise, 30-minute walk on weekends.  Hyperlipidemia Elevated cholesterol, previously on Zetia . Prediabetic, allergic to aspirin, increased cardiovascular risk. Cholesterol management crucial. - Resume Zetia .  - Order fasting cholesterol lab tests. - Encourage low-salt diet, adequate protein intake.  Hypertension Blood pressure well-controlled. Maintaining control important to prevent stroke. - Continue current antihypertensive regimen. - Encourage regular home blood pressure monitoring.  Hyperthyroidism with Multinodular Goiter Managed with methimazole. Asymptomatic goiter, family history of thyroid  disease. - Continue methimazole.  Prediabetes A1c of 6.1%. Jardiance denied by insurance. Weight loss important to manage prediabetes. - Encourage weight loss through diet and exercise. - Monitor blood glucose levels regularly.  Allergic Rhinitis Severe allergies managed with Astelin , Zyrtec, and a [place] pink allergy pill. Astelin  nasal spray discussed. - Continue current allergy medications. - Use Astelin  nasal spray as needed.  General Health Maintenance Up to date with mammograms, family history of breast cancer. Colonoscopy scheduled. for July.  - Ensure mammogram scheduled for September. - Complete scheduled colonoscopy in July.        Return in about 3 months (around 09/01/2024).   Arloa Prak K Hayato Guaman, MD

## 2024-06-01 NOTE — Assessment & Plan Note (Signed)
 Trial of escitalopram 5 mg daily taken in the evening please.  See if it improves your sleep

## 2024-06-01 NOTE — Addendum Note (Signed)
 Addended by: Carnell Casamento K on: 06/01/2024 05:06 PM   Modules accepted: Orders

## 2024-06-02 ENCOUNTER — Telehealth: Payer: Self-pay | Admitting: Family Medicine

## 2024-06-02 NOTE — Telephone Encounter (Signed)
 Copied from CRM [id number]. Topic: Clinical - Prescription Issue >> Jun 01, 2024  4:48 PM Kendra Rhodes wrote: Reason for CRM: Patient calling about prescriptions discussed at appt today, they have not been called in to pharmacy- [phone]

## 2024-06-02 NOTE — Telephone Encounter (Signed)
 Called patient and left her a voicemail letting her know that I had called [facility] in [place] to check up on her escitalopram and ezetimibe  and they stated that they were ready for pick-up. I let the patient know that if she needed additional help, to please call us  back.

## 2024-06-29 ENCOUNTER — Encounter: Payer: Self-pay | Admitting: Family Medicine

## 2024-06-29 ENCOUNTER — Ambulatory Visit: Admitting: Family Medicine

## 2024-06-29 VITALS — BP 147/91 | HR 112 | Temp 98.4°F | Resp 18 | Ht 66.0 in | Wt 185.0 lb

## 2024-06-29 DIAGNOSIS — E0590 Thyrotoxicosis, unspecified without thyrotoxic crisis or storm: Secondary | ICD-10-CM

## 2024-06-29 DIAGNOSIS — E1159 Type 2 diabetes mellitus with other circulatory complications: Secondary | ICD-10-CM | POA: Diagnosis not present

## 2024-06-29 DIAGNOSIS — K5909 Other constipation: Secondary | ICD-10-CM

## 2024-06-29 DIAGNOSIS — F419 Anxiety disorder, unspecified: Secondary | ICD-10-CM

## 2024-06-29 DIAGNOSIS — F32A Depression, unspecified: Secondary | ICD-10-CM

## 2024-06-29 DIAGNOSIS — N644 Mastodynia: Secondary | ICD-10-CM | POA: Insufficient documentation

## 2024-06-29 DIAGNOSIS — I152 Hypertension secondary to endocrine disorders: Secondary | ICD-10-CM | POA: Diagnosis not present

## 2024-06-29 MED ORDER — METHIMAZOLE 5 MG PO TABS: 10.0000 mg | ORAL_TABLET | Freq: Every day | ORAL | 0 refills | Status: AC

## 2024-06-29 NOTE — Assessment & Plan Note (Signed)
 On Lexapro  5 mg a day which she takes at night.  It helps her sleep and it is helping with anxiety.  She feels the dose is adequate.

## 2024-06-29 NOTE — Progress Notes (Signed)
 Established Patient Office Visit  Subjective   Patient ID: Kendra Rhodes, female    DOB: May 13, 1956  Age: 68 y.o. MRN: [id number]  Chief Complaint  Patient presents with   Breast Pain    Left. 2 mths ago. Comes and goes    HPI Delightful 67-yo with mixed hyperlipidemia, prediabetes, CKD 3A, hyperthyroidism, multinodular goiter, HTN, s/p vaginal hysterectomy and tubal ligation, mammograms are done in September annually, colonoscopy scheduled 07/08/2024.   Discussed the use of AI scribe software for clinical note transcription with the patient, who gave verbal consent to proceed.  History of Present Illness   Kendra Rhodes is a 68 year old female who presents with intermittent left breast pain.  She experiences intermittent left breast pain described as a constant ache similar to a bruise, lasting for a couple of days. The pain worsens with caffeine intake. The pain in her left breast does not get worse with working like lifting heavy packages at work.  The pain is localized and becomes more pronounced when the area is touched. No pain is reported on the right side.  She has a history of anxiety and is currently taking Lexapro  5 mg at night, which helps with her anxiety and sleep. Missing a dose leads to worsening symptoms. She restarted Zetia  without experiencing any leg pain, which was a previous concern. She also uses Miralax, one scoop daily, to aid bowel movements, which became effective after about a week.  Her family history is significant for her mother having died of breast cancer, which contributes to her concern about the breast pain.  10/01/2021 she had a normal myocardial perfusion scan with no evidence of stress-induced myocardial ischemia, ejection fraction 61%.  Conclusion: Negative scan.   Echo 10/01/2021: Normal LV systolic function, estimated EF greater than 55%, normal right ventricular systolic function, mild to moderate mitral valve insufficiency, mild tricuspid valve  insufficiency, trace aortic valve insufficiency.  No valvular stenosis, mild LA enlargement, moderate LVH.        ROS    Objective:     BP (!) 147/91 (BP Location: Right Arm, Patient Position: Sitting, Cuff Size: Normal)   Pulse (!) 112   Temp 98.4 F (36.9 C) (Oral)   Resp 18   Ht 5' 6 (1.676 m)   Wt 185 lb (83.9 kg)   SpO2 93%   BMI 29.86 kg/m    Physical Exam Vitals and nursing note reviewed.  Constitutional:      Appearance: Normal appearance.  HENT:     Head: Normocephalic and atraumatic.  Eyes:     Conjunctiva/sclera: Conjunctivae normal.  Cardiovascular:     Rate and Rhythm: Normal rate and regular rhythm.  Pulmonary:     Effort: Pulmonary effort is normal.     Breath sounds: Normal breath sounds.  Chest:  Breasts:    Breasts are symmetrical.     Right: Normal. No inverted nipple, mass, nipple discharge, skin change or tenderness.     Left: Normal. No inverted nipple, mass, nipple discharge, skin change or tenderness.  Musculoskeletal:     Right lower leg: No edema.     Left lower leg: No edema.  Skin:    General: Skin is warm and dry.  Neurological:     Mental Status: She is alert and oriented to person, place, and time.  Psychiatric:        Mood and Affect: Mood normal.        Behavior: Behavior normal.  Thought Content: Thought content normal.        Judgment: Judgment normal.          No results found for any visits on 06/29/24.    The 10-year ASCVD risk score (Arnett DK, et al., 2019) is: 28%    Assessment & Plan:  Hyperthyroidism -     methIMAzole ; Take 2 tablets (10 mg total) by mouth daily.  Dispense: 180 tablet; Refill: 0  Breast tenderness in female Assessment & Plan: Has left lateral breast pain that is intermittent and not associated with trauma.  Her mother had breast cancer.  Normal breast exam.  Will schedule diagnostic mammogram and ultrasound if needed.  Orders: -     MM 3D DIAGNOSTIC MAMMOGRAM UNILATERAL LEFT  BREAST; Future  Hypertension associated with diabetes (HCC) Assessment & Plan: She did not take her blood pressure medication today.  Discussed carrying a small amount of her medication in her pocket book so that she realizes she has forgotten her medicine she can take it.   Chronic constipation Assessment & Plan: On MiraLAX 17 g daily and having positive effect.   Anxiety and depression Assessment & Plan: On Lexapro  5 mg a day which she takes at night.  It helps her sleep and it is helping with anxiety.  She feels the dose is adequate.      Return if symptoms worsen or fail to improve.    Macauley Mossberg K Scottie Stanish, MD

## 2024-06-29 NOTE — Assessment & Plan Note (Signed)
 Has left lateral breast pain that is intermittent and not associated with trauma.  Her mother had breast cancer.  Normal breast exam.  Will schedule diagnostic mammogram and ultrasound if needed.

## 2024-06-29 NOTE — Assessment & Plan Note (Signed)
 She did not take her blood pressure medication today.  Discussed carrying a small amount of her medication in her pocket book so that she realizes she has forgotten her medicine she can take it.

## 2024-06-29 NOTE — Assessment & Plan Note (Signed)
 On MiraLAX 17 g daily and having positive effect.

## 2024-06-30 ENCOUNTER — Other Ambulatory Visit: Payer: Self-pay | Admitting: Family Medicine

## 2024-06-30 DIAGNOSIS — Z1231 Encounter for screening mammogram for malignant neoplasm of breast: Secondary | ICD-10-CM

## 2024-06-30 DIAGNOSIS — N644 Mastodynia: Secondary | ICD-10-CM

## 2024-07-05 ENCOUNTER — Telehealth: Payer: Self-pay

## 2024-07-05 NOTE — Telephone Encounter (Signed)
 Pt called stating that she had misplaced her instructions... I helped pt find the letter in Mychart and went over the instructions to make sure she is aware that she needed to hold her Jardiance... Nothing further needed

## 2024-07-08 ENCOUNTER — Encounter
Admission: RE | Disposition: A | Discharge status: Home / Self Care | Payer: Self-pay | Source: Home / Self Care | Attending: Gastroenterology

## 2024-07-08 ENCOUNTER — Ambulatory Visit
Admission: RE | Admit: 2024-07-08 | Discharge: 2024-07-08 | Disposition: A | Discharge status: Home / Self Care | Attending: Gastroenterology | Admitting: Gastroenterology

## 2024-07-08 ENCOUNTER — Ambulatory Visit: Admitting: Anesthesiology

## 2024-07-08 ENCOUNTER — Other Ambulatory Visit: Payer: Self-pay

## 2024-07-08 ENCOUNTER — Encounter: Payer: Self-pay | Admitting: Gastroenterology

## 2024-07-08 ENCOUNTER — Telehealth: Payer: Self-pay

## 2024-07-08 DIAGNOSIS — D123 Benign neoplasm of transverse colon: Secondary | ICD-10-CM | POA: Diagnosis not present

## 2024-07-08 DIAGNOSIS — E1122 Type 2 diabetes mellitus with diabetic chronic kidney disease: Secondary | ICD-10-CM | POA: Insufficient documentation

## 2024-07-08 DIAGNOSIS — E669 Obesity, unspecified: Secondary | ICD-10-CM | POA: Diagnosis not present

## 2024-07-08 DIAGNOSIS — I129 Hypertensive chronic kidney disease with stage 1 through stage 4 chronic kidney disease, or unspecified chronic kidney disease: Secondary | ICD-10-CM | POA: Insufficient documentation

## 2024-07-08 DIAGNOSIS — K634 Enteroptosis: Secondary | ICD-10-CM | POA: Diagnosis not present

## 2024-07-08 DIAGNOSIS — F32A Depression, unspecified: Secondary | ICD-10-CM | POA: Diagnosis not present

## 2024-07-08 DIAGNOSIS — D573 Sickle-cell trait: Secondary | ICD-10-CM | POA: Diagnosis not present

## 2024-07-08 DIAGNOSIS — F419 Anxiety disorder, unspecified: Secondary | ICD-10-CM | POA: Insufficient documentation

## 2024-07-08 DIAGNOSIS — K641 Second degree hemorrhoids: Secondary | ICD-10-CM | POA: Insufficient documentation

## 2024-07-08 DIAGNOSIS — N183 Chronic kidney disease, stage 3 unspecified: Secondary | ICD-10-CM | POA: Insufficient documentation

## 2024-07-08 DIAGNOSIS — K635 Polyp of colon: Secondary | ICD-10-CM | POA: Insufficient documentation

## 2024-07-08 DIAGNOSIS — Z1211 Encounter for screening for malignant neoplasm of colon: Secondary | ICD-10-CM | POA: Insufficient documentation

## 2024-07-08 DIAGNOSIS — Z860100 Personal history of colon polyps, unspecified: Secondary | ICD-10-CM

## 2024-07-08 DIAGNOSIS — Z7984 Long term (current) use of oral hypoglycemic drugs: Secondary | ICD-10-CM | POA: Diagnosis not present

## 2024-07-08 HISTORY — PX: POLYPECTOMY: SHX149

## 2024-07-08 HISTORY — PX: COLONOSCOPY: SHX5424

## 2024-07-08 SURGERY — COLONOSCOPY
Anesthesia: General

## 2024-07-08 MED ORDER — PROPOFOL 10 MG/ML IV BOLUS
INTRAVENOUS | Status: DC | PRN
  Administered 2024-07-08 (×2): 50 mg via INTRAVENOUS

## 2024-07-08 MED ORDER — PROPOFOL 500 MG/50ML IV EMUL
INTRAVENOUS | Status: DC | PRN
  Administered 2024-07-08: 75 ug/kg/min via INTRAVENOUS

## 2024-07-08 MED ORDER — LIDOCAINE HCL (CARDIAC) PF 100 MG/5ML IV SOSY
PREFILLED_SYRINGE | INTRAVENOUS | Status: DC | PRN
  Administered 2024-07-08: 80 mg via INTRAVENOUS

## 2024-07-08 MED ORDER — DEXMEDETOMIDINE HCL IN NACL 80 MCG/20ML IV SOLN
INTRAVENOUS | Status: DC | PRN
  Administered 2024-07-08: 20 ug via INTRAVENOUS

## 2024-07-08 MED ORDER — DEXMEDETOMIDINE HCL IN NACL 80 MCG/20ML IV SOLN
INTRAVENOUS | Status: AC
  Filled 2024-07-08: qty 20

## 2024-07-08 MED ORDER — SODIUM CHLORIDE 0.9 % IV SOLN: INTRAVENOUS | Status: DC

## 2024-07-08 MED ORDER — LIDOCAINE HCL (PF) 2 % IJ SOLN
INTRAMUSCULAR | Status: AC
  Filled 2024-07-08: qty 15

## 2024-07-08 NOTE — Anesthesia Postprocedure Evaluation (Signed)
 Anesthesia Post Note  Patient: Kendra Rhodes  Procedure(s) Performed: COLONOSCOPY POLYPECTOMY, INTESTINE  Patient location during evaluation: PACU Anesthesia Type: General Level of consciousness: awake and alert, oriented and patient cooperative Pain management: pain level controlled Vital Signs Assessment: post-procedure vital signs reviewed and stable Respiratory status: spontaneous breathing, nonlabored ventilation and respiratory function stable Cardiovascular status: blood pressure returned to baseline and stable Postop Assessment: adequate PO intake Anesthetic complications: no   No notable events documented.   Last Vitals:  Vitals:   07/08/24 1130 07/08/24 1140  BP: (!) 113/96 (!) 137/93  Pulse: 77 100  Resp: 15 18  Temp:    SpO2: 100% 99%    Last Pain:  Vitals:   07/08/24 1140  TempSrc:   PainSc: 0-No pain                 Alfonso Ruths

## 2024-07-08 NOTE — Telephone Encounter (Signed)
 Pt called to report that she has noticed some blood coming from rectum, spotting... Pt had 2 small polyps removed... ED precautions given and she is aware that some spotting x 1-2 days following procedure is normal with polyp removal... As Wohl as the bleeding does not become heavy, also with N/V fever/chills, abd pain, see if spotting improves x 1-2 days  Pt expressed understanding

## 2024-07-08 NOTE — Anesthesia Preprocedure Evaluation (Addendum)
 Anesthesia Evaluation  Patient identified by MRN, date of birth, ID band Patient awake    Reviewed: Allergy & Precautions, NPO status , Patient's Chart, lab work & pertinent test results  History of Anesthesia Complications Negative for: history of anesthetic complications  Airway Mallampati: III   Neck ROM: Full    Dental  (+) Missing   Pulmonary neg pulmonary ROS   Pulmonary exam normal breath sounds clear to auscultation       Cardiovascular hypertension, Normal cardiovascular exam Rhythm:Regular Rate:Normal  Myocardial perfusion 10/01/21:   Normal myocardial perfusion scan no evidence of stress-induced  myocardial ischemia ejection fraction of 61% conclusion negative scan   Echo 10/01/21:  NORMAL LEFT VENTRICULAR SYSTOLIC FUNCTION WITH AN ESTIMATED EF = >55 %  NORMAL RIGHT VENTRICULAR SYSTOLIC FUNCTION  MILD-TO-MODERATE MITRAL VALVE INSUFFICIENCY  MILD TRICUSPID VALVE INSUFFICIENCY  TRACE AORTIC VALVE INSUFFICIENCY  NO VALVULAR STENOSIS  MILD LA ENLARGEMENT  MODERATE LVH     Neuro/Psych  PSYCHIATRIC DISORDERS Anxiety Depression    negative neurological ROS     GI/Hepatic negative GI ROS,,,  Endo/Other  diabetes, Type 2  Obesity   Renal/GU Renal disease (stage III CKD)     Musculoskeletal   Abdominal   Peds  Hematology  (+) Blood dyscrasia, Sickle cell trait and anemia   Anesthesia Other Findings   Reproductive/Obstetrics                              Anesthesia Physical Anesthesia Plan  ASA: 2  Anesthesia Plan: General   Post-op Pain Management:    Induction: Intravenous  PONV Risk Score and Plan: 3 and Propofol  infusion, TIVA and Treatment may vary due to age or medical condition  Airway Management Planned: Natural Airway  Additional Equipment:   Intra-op Plan:   Post-operative Plan:   Informed Consent: I have reviewed the patients History and Physical, chart,  labs and discussed the procedure including the risks, benefits and alternatives for the proposed anesthesia with the patient or authorized representative who has indicated his/her understanding and acceptance.       Plan Discussed with: CRNA  Anesthesia Plan Comments: (LMA/GETA backup discussed.  Patient consented for risks of anesthesia including but not limited to:  - adverse reactions to medications - damage to eyes, teeth, lips or other oral mucosa - nerve damage due to positioning  - sore throat or hoarseness - damage to heart, brain, nerves, lungs, other parts of body or loss of life  Informed patient about role of CRNA in peri- and intra-operative care.  Patient voiced understanding.)         Anesthesia Quick Evaluation

## 2024-07-08 NOTE — H&P (Signed)
 Rogelia Copping, MD [facility] [address], [address] [place], [place] [address] [phone] Fax : [phone]  Primary Care Physician:  Ziglar, Susan K, MD Primary Gastroenterologist:  Dr. Copping  Pre-Procedure History & Physical: HPI:  Kendra Rhodes is a 68 y.o. female is here for an colonoscopy.   Past Medical History:  Diagnosis Date   Anemia    iron deficiency and vitamin d  deficiency   Anxiety    Bladder mass 11/2019   Breast mass 10/2019   bilateral, ? cysts   CKD (chronic kidney disease), stage III (HCC)    COVID-19    01/03/21   Depression    Hypertension    Insomnia    Iron deficiency anemia due to chronic blood loss    Obesity (BMI 30-39.9) 05/02/2020   Pre-diabetes    Sickle cell trait (HCC)    Stage 2 chronic kidney disease 05/02/2020   Thyroid  goiter    Tubular adenoma of colon    Wears contact lenses    Wears dentures    upper and lower partials, also permanent retainer    Past Surgical History:  Procedure Laterality Date   ABDOMINAL HYSTERECTOMY  01/26/2010   for prolapse per pt with mesh ovaries still intact no h/o abnormal pap,DONE AT [facility].FOUND ON CARE EVERYWHERE   COLONOSCOPY WITH PROPOFOL  N/A 06/10/2019   Procedure: COLONOSCOPY WITH PROPOFOL ;  Surgeon: Copping Rogelia, MD;  Location: [facility];  Service: Endoscopy;  Laterality: N/A;   CYSTOSCOPY WITH BIOPSY N/A 12/03/2019   Procedure: CYSTOSCOPY WITH bladder BIOPSY;  Surgeon: Francisca Redell BROCKS, MD;  Location: [facility];  Service: Urology;  Laterality: N/A;   CYSTOSCOPY WITH FULGERATION N/A 12/03/2019   Procedure: CYSTOSCOPY WITH FULGERATION;  Surgeon: Francisca Redell BROCKS, MD;  Location: [facility];  Service: Urology;  Laterality: N/A;   ESOPHAGOGASTRODUODENOSCOPY (EGD) WITH PROPOFOL  N/A 06/10/2019   Procedure: ESOPHAGOGASTRODUODENOSCOPY (EGD) WITH PROPOFOL ;  Surgeon: Copping Rogelia, MD;  Location: [facility];  Service: Endoscopy;  Laterality: N/A;   EYE SURGERY Bilateral    pterygium, both  sides repaired   GIVENS CAPSULE STUDY N/A 08/24/2019   Procedure: GIVENS CAPSULE STUDY;  Surgeon: Copping Rogelia, MD;  Location: [facility];  Service: Endoscopy;  Laterality: N/A;   POLYPECTOMY  06/10/2019   Procedure: POLYPECTOMY;  Surgeon: Copping Rogelia, MD;  Location: [facility];  Service: Endoscopy;;    Prior to Admission medications   Medication Sig Start Date End Date Taking? Authorizing Provider  albuterol  (VENTOLIN  HFA) 108 (90 Base) MCG/ACT inhaler Inhale 2 puffs into the lungs every 4 (four) hours as needed. 02/06/24  Yes Bernardino Ditch, NP  atenolol (TENORMIN) 25 MG tablet    Yes [provider]  Cholecalciferol (VITAMIN D ) 50 MCG (2000 UT) CAPS Take 2,000 Units by mouth every 7 (seven) days.   Yes [provider]  ezetimibe  (ZETIA ) 10 MG tablet Take 1 tablet (10 mg total) by mouth daily. 04/06/24  Yes Hope Merle, MD  hydrOXYzine  (VISTARIL ) 25 MG capsule TAKE 1 CAPSULE(25 MG) BY MOUTH EVERY 8 HOURS AS NEEDED 10/08/23  Yes Kaur, Charanpreet, NP  methimazole  (TAPAZOLE ) 5 MG tablet Take 2 tablets (10 mg total) by mouth daily. 06/29/24  Yes Ziglar, Susan K, MD  olmesartan -hydrochlorothiazide  (BENICAR  HCT) 20-12.5 MG tablet TAKE 1 TABLET BY MOUTH DAILY IN THE MORNING 04/21/24  Yes Hope Merle, MD  tiZANidine  (ZANAFLEX ) 4 MG tablet Take 0.5 tablets (2 mg total) by mouth every 6 (six) hours as needed for muscle spasms. 04/29/24  Yes Hope Merle, MD  azelastine  (ASTELIN ) 0.1 % nasal spray Place 2 sprays into both nostrils 2 (two) times daily. Use in each nostril as directed prn Patient not taking: No sig reported 10/31/20   McLean-Scocuzza, Randine SAILOR, MD  Blood Pressure KIT 1 Device by Does not apply route daily. BP cuff adult large size 10/31/20   McLean-Scocuzza, Randine SAILOR, MD  buPROPion (WELLBUTRIN XL) 150 MG 24 hr tablet     [provider]  escitalopram  (LEXAPRO ) 5 MG tablet Take 1 tablet (5 mg total) by mouth at bedtime. 06/01/24   Ziglar, Susan K, MD   fexofenadine  (ALLEGRA  ALLERGY) 180 MG tablet Take 1 tablet (180 mg total) by mouth daily. Patient not taking: Reported on 06/29/2024 07/16/23   Hope Merle, MD  fluticasone  (FLONASE ) 50 MCG/ACT nasal spray Place 2 sprays into both nostrils daily. Patient not taking: Reported on 06/29/2024 07/16/23   Walsh, Tanya, MD  ipratropium (ATROVENT ) 0.06 % nasal spray Place 2 sprays into both nostrils 4 (four) times daily. Patient not taking: Reported on 06/29/2024 02/06/24   Bernardino Ditch, NP  JARDIANCE 10 MG TABS tablet Take 10 mg by mouth daily.    [provider]  Spacer/Aero-Holding Chambers (AEROCHAMBER MV) inhaler Use as instructed 02/06/24   Bernardino Ditch, NP    Allergies as of 05/21/2024 - Review Complete 05/21/2024  Allergen Reaction Noted   Sulfa antibiotics Itching, Swelling, and Other (See Comments) 03/25/2016   Wheat Other (See Comments) 02/24/2019   Belsomra [suvorexant]  01/14/2020   Melatonin  10/31/2020   Ambien [zolpidem tartrate] Other (See Comments) 09/10/2019   Aspirin Other (See Comments) 09/30/2018   Prednisone Palpitations 03/25/2016   Shellfish allergy Itching 06/07/2019   Strawberry extract Itching 02/24/2019    Family History  Problem Relation Age of Onset   Breast cancer Mother 33   Diabetes Mother    Kidney failure Father    Alcohol abuse Father    Heart disease Father        chf   Hyperlipidemia Father    Lung cancer Maternal Aunt    Autism Grandson    Other Other        fatty liver cousin   Sickle cell anemia Other        nephew   Cancer Other        aunt with ovarian cancer    Social History   Socioeconomic History   Marital status: Divorced    Spouse name: Not on file   Number of children: Not on file   Years of education: Not on file   Highest education level: Doctorate  Occupational History   Not on file  Tobacco Use   Smoking status: Never    Passive exposure: Never   Smokeless tobacco: Never  Vaping Use   Vaping status: Never  Used  Substance and Sexual Activity   Alcohol use: Never   Drug use: Never   Sexual activity: Yes    Birth control/protection: Surgical    Comment: Hysterectomy  Other Topics Concern   Not on file  Social History Narrative   [employer] employee   2 kids daughter Valdene Ling and son Llesuer Debby   Works homeless shelter weekends    Works [employer] in [place] as of 04/2020   Social Drivers of Health   Financial Resource Strain: Low Risk  (08/29/2023)   Overall Financial Resource Strain (CARDIA)    Difficulty of Paying Living Expenses: Not hard at all  Food Insecurity:  No Food Insecurity (08/29/2023)   Hunger Vital Sign    Worried About Running Out of Food in the Last Year: Never true    Ran Out of Food in the Last Year: Never true  Transportation Needs: No Transportation Needs (08/29/2023)   PRAPARE - Administrator, Civil Service (Medical): No    Lack of Transportation (Non-Medical): No  Physical Activity: Insufficiently Active (08/29/2023)   Exercise Vital Sign    Days of Exercise per Week: 1 day    Minutes of Exercise per Session: 20 min  Stress: No Stress Concern Present (08/29/2023)   [employer] of Occupational Health - Occupational Stress Questionnaire    Feeling of Stress : Only a little  Social Connections: Moderately Integrated (08/29/2023)   Social Connection and Isolation Panel    Frequency of Communication with Friends and Family: More than three times a week    Frequency of Social Gatherings with Friends and Family: More than three times a week    Attends Religious Services: More than 4 times per year    Active Member of [employer] or Organizations: Yes    Attends Engineer, structural: More than 4 times per year    Marital Status: Divorced  Catering manager Violence: Not on file    Review of Systems: See HPI, otherwise negative ROS  Physical Exam: BP (!) 136/93   Pulse 98   Temp (!) 95.6 F (35.3 C) (Temporal)   Resp 18   Ht 5' 6  (1.676 m)   Wt (!) 176.6 kg   SpO2 99%   BMI 62.84 kg/m  General:   Alert,  pleasant and cooperative in NAD Head:  Normocephalic and atraumatic. Neck:  Supple; no masses or thyromegaly. Lungs:  Clear throughout to auscultation.    Heart:  Regular rate and rhythm. Abdomen:  Soft, nontender and nondistended. Normal bowel sounds, without guarding, and without rebound.   Neurologic:  Alert and  oriented x4;  grossly normal neurologically.  Impression/Plan: Kendra Rhodes is here for an colonoscopy to be performed for a history of adenomatous polyps on 2020   Risks, benefits, limitations, and alternatives regarding  colonoscopy have been reviewed with the patient.  Questions have been answered.  All parties agreeable.   Rogelia Copping, MD  07/08/2024, 10:03 AM

## 2024-07-08 NOTE — Transfer of Care (Signed)
 Immediate Anesthesia Transfer of Care Note  Patient: Kendra Rhodes  Procedure(s) Performed: COLONOSCOPY POLYPECTOMY, INTESTINE  Patient Location: PACU  Anesthesia Type:General  Level of Consciousness: sedated  Airway & Oxygen Therapy: Patient Spontanous Breathing  Post-op Assessment: Report given to RN and Post -op Vital signs reviewed and stable  Post vital signs: Reviewed and stable  Last Vitals:  Vitals Value Taken Time  BP    Temp    Pulse    Resp    SpO2      Last Pain:  Vitals:   07/08/24 0942  TempSrc: Temporal  PainSc: 0-No pain         Complications: No notable events documented.

## 2024-07-08 NOTE — Op Note (Signed)
 [facility] Patient Name: Kendra Rhodes Procedure Date: 07/08/2024 10:54 AM MRN: [id number] Account #: [id number] Date of Birth: Mar 22, 1956 Admit Type: Outpatient Age: 68 Room: [facility] ENDO ROOM 3 Gender: Female Note Status: Finalized Instrument Name: Arvis [id number] Procedure:             Colonoscopy Indications:           High risk colon cancer surveillance: Personal history                         of colonic polyps Providers:             Rogelia Copping MD, MD Referring MD:          Susan K. Ziglar (Referring MD) Medicines:             Propofol  per Anesthesia Complications:         No immediate complications. Procedure:             Pre-Anesthesia Assessment:                        - Prior to the procedure, a History and Physical was                         performed, and patient medications and allergies were                         reviewed. The patient's tolerance of previous                         anesthesia was also reviewed. The risks and benefits                         of the procedure and the sedation options and risks                         were discussed with the patient. All questions were                         answered, and informed consent was obtained. Prior                         Anticoagulants: The patient has taken no anticoagulant                         or antiplatelet agents. ASA Grade Assessment: II - A                         patient with mild systemic disease. After reviewing                         the risks and benefits, the patient was deemed in                         satisfactory condition to undergo the procedure.                        After obtaining informed consent, the colonoscope was  passed under direct vision. Throughout the procedure,                         the patient's blood pressure, pulse, and oxygen                         saturations were monitored continuously. The                          Colonoscope was introduced through the anus and                         advanced to the the cecum, identified by appendiceal                         orifice and ileocecal valve. The colonoscopy was                         performed without difficulty. The patient tolerated                         the procedure well. The quality of the bowel                         preparation was excellent. Findings:      The perianal and digital rectal examinations were normal.      A 4 mm polyp was found in the sigmoid colon. The polyp was sessile. The       polyp was removed with a cold snare. Resection and retrieval were       complete.      A 4 mm polyp was found in the transverse colon. The polyp was sessile.       The polyp was removed with a cold snare. Resection and retrieval were       complete.      Non-bleeding internal hemorrhoids were found during retroflexion. The       hemorrhoids were Grade II (internal hemorrhoids that prolapse but reduce       spontaneously). Impression:            - One 4 mm polyp in the sigmoid colon, removed with a                         cold snare. Resected and retrieved.                        - One 4 mm polyp in the transverse colon, removed with                         a cold snare. Resected and retrieved.                        - Non-bleeding internal hemorrhoids. Recommendation:        - Discharge patient to home.                        - Resume previous diet.                        - Continue present medications.                        -  Await pathology results.                        - Repeat colonoscopy in 7 years for surveillance. Procedure Code(s):     --- Professional ---                        [phone], Colonoscopy, flexible; with removal of                         tumor(s), polyp(s), or other lesion(s) by snare                         technique Diagnosis Code(s):     --- Professional ---                        Z86.010, Personal history of  colonic polyps                        D12.5, Benign neoplasm of sigmoid colon CPT copyright 2022 American Medical Association. All rights reserved. The codes documented in this report are preliminary and upon coder review may  be revised to meet current compliance requirements. Rogelia Copping MD, MD 07/08/2024 11:19:55 AM This report has been signed electronically. Number of Addenda: 0 Note Initiated On: 07/08/2024 10:54 AM Scope Withdrawal Time: 0 hours 6 minutes 42 seconds  Total Procedure Duration: 0 hours 9 minutes 43 seconds  Estimated Blood Loss:  Estimated blood loss: none.      [facility]

## 2024-07-09 ENCOUNTER — Encounter: Payer: Self-pay | Admitting: Gastroenterology

## 2024-07-12 ENCOUNTER — Ambulatory Visit: Payer: Self-pay | Admitting: Gastroenterology

## 2024-07-12 LAB — SURGICAL PATHOLOGY

## 2024-07-13 ENCOUNTER — Ambulatory Visit
Admission: RE | Admit: 2024-07-13 | Discharge: 2024-07-13 | Disposition: A | Discharge status: Home / Self Care | Source: Ambulatory Visit | Attending: Family Medicine | Admitting: Family Medicine

## 2024-07-13 ENCOUNTER — Inpatient Hospital Stay
Admission: RE | Admit: 2024-07-13 | Discharge: 2024-07-13 | Discharge status: Home / Self Care | Source: Ambulatory Visit | Attending: Family Medicine | Admitting: Family Medicine

## 2024-07-13 DIAGNOSIS — Z1231 Encounter for screening mammogram for malignant neoplasm of breast: Secondary | ICD-10-CM | POA: Insufficient documentation

## 2024-07-13 DIAGNOSIS — N644 Mastodynia: Secondary | ICD-10-CM

## 2024-08-23 ENCOUNTER — Other Ambulatory Visit: Payer: Self-pay | Admitting: Physician Assistant

## 2024-08-23 DIAGNOSIS — Z780 Asymptomatic menopausal state: Secondary | ICD-10-CM

## 2024-09-01 ENCOUNTER — Encounter: Payer: Self-pay | Admitting: Family Medicine

## 2024-09-01 ENCOUNTER — Ambulatory Visit: Admitting: Family Medicine

## 2024-09-01 VITALS — BP 134/86 | HR 80 | Temp 98.3°F | Resp 20 | Ht 66.0 in | Wt 184.0 lb

## 2024-09-01 DIAGNOSIS — E118 Type 2 diabetes mellitus with unspecified complications: Secondary | ICD-10-CM | POA: Diagnosis not present

## 2024-09-01 DIAGNOSIS — F32A Depression, unspecified: Secondary | ICD-10-CM

## 2024-09-01 DIAGNOSIS — F419 Anxiety disorder, unspecified: Secondary | ICD-10-CM | POA: Diagnosis not present

## 2024-09-01 LAB — POCT GLYCOSYLATED HEMOGLOBIN (HGB A1C): Hemoglobin A1C: 6.1 % — AB (ref 4.0–5.6)

## 2024-09-01 MED ORDER — ESCITALOPRAM OXALATE 10 MG PO TABS: 10.0000 mg | ORAL_TABLET | Freq: Every day | ORAL | 1 refills | Status: DC

## 2024-09-01 NOTE — Progress Notes (Signed)
 Established Patient Office Visit  Subjective   Patient ID: Kendra Rhodes, female    DOB: October 23, 1956  Age: 68 y.o. MRN: [id number]  Chief Complaint  Patient presents with   Medical Management of Chronic Issues    HPI  Discussed the use of AI scribe software for clinical note transcription with the patient, who gave verbal consent to proceed.  History of Present Illness   Kendra Rhodes is a 68 year old female who presents for a lipid panel and follow-up on her blood pressure management.  She has been experiencing breast pain, which led to a diagnostic bilateral mammogram. The mammogram showed no abnormalities, and the pain has since resolved.  Her blood pressure management includes daily medication, with home readings averaging 134/86 mmHg. Occasionally, her readings reach 142 mmHg, which she attributes to work-related stress. She does not consistently sit for five minutes before taking her blood pressure at home.  She takes Lexapro  5mg  at night and reports that it helps her sleep and with anxiety. However, she experiences difficulty focusing and completing tasks, and she is uncertain if this is related to her medication or job stress. She sometimes has sleep disturbances and is contemplating whether her current dose is adequate.  She recently started Zetia  for cholesterol management and reports no adverse effects. Her last cholesterol check showed a total cholesterol of 196 mg/dL, triglycerides of 849 mg/dL, HDL of 57 mg/dL, and LDL of 886 mg/dL. She has joined a gym to start water  aerobics.Her 10 year ASCVD risk is 24%.  She needs her cholesterol checked now that she is on Zetia .  She is on methimazole  for her thyroid , prescribed by her kidney doctor, and reports significant improvement in her kidney function. She also takes Jardiance, provided as samples due to insurance coverage issues, for kidney health.  She has been under stress due to the recent loss of her 58 year old dog, which  was put down due to cancer. This event has been emotionally challenging for her.  A1c, today is 6.1%.    ROS    Objective:     BP 134/86 (BP Location: Right Arm, Patient Position: Sitting, Cuff Size: Normal)   Pulse 80   Temp 98.3 F (36.8 C) (Oral)   Resp 20   Ht 5' 6 (1.676 m)   Wt 184 lb (83.5 kg)   SpO2 97%   BMI 29.70 kg/m    Physical Exam Vitals reviewed.  Constitutional:      Appearance: Normal appearance.  HENT:     Head: Normocephalic.  Eyes:     General:        Right eye: No discharge.        Left eye: No discharge.  Cardiovascular:     Rate and Rhythm: Normal rate.  Pulmonary:     Effort: Pulmonary effort is normal.  Neurological:     Mental Status: She is alert and oriented to person, place, and time.  Psychiatric:        Mood and Affect: Mood normal.        Behavior: Behavior normal.        Thought Content: Thought content normal.        Judgment: Judgment normal.          Results for orders placed or performed in visit on 09/01/24  POCT glycosylated hemoglobin (Hb A1C)  Result Value Ref Range   Hemoglobin A1C 6.1 (A) 4.0 - 5.6 %   HbA1c POC (<> result, manual entry)  HbA1c, POC (prediabetic range)     HbA1c, POC (controlled diabetic range)        The 10-year ASCVD risk score (Arnett DK, et al., 2019) is: 24.4%    Assessment & Plan:  Type 2 diabetes mellitus with complications (HCC) -     POCT glycosylated hemoglobin (Hb A1C)  Anxiety and depression  Assessment and Plan    Hypertension and chronic kidney disease Hypertension is well-managed with olmesartan -hydrochlorothiazide  and atenolol 25mg  daily (?), She is not certain if she is taking atenolol 25mg  daily.  She will check her prescription bottles when she gets home and call us  back.  Recent readings averaging 134/86 mmHg. Occasional higher readings may be stress-related. Chronic kidney disease is stable with improved kidney function, managed with Jardiance provided as  samples due to insurance coverage issues. - Continue atenolol 25 mg and Benicar  20 mg -12.5 mg for blood pressure management - Monitor blood pressure at home, ensuring to sit for at least 5 minutes before taking readings - Continue Jardiance for kidney health - Contact the office with the name of the second blood pressure medication  Hyperlipidemia Cholesterol levels are not significantly elevated, with total cholesterol at 196 mg/dL, triglycerides at 849 mg/dL, HDL at 57 mg/dL, and LDL at 886 mg/dL. Zetia  is effective without adverse effects. Lifestyle modifications, including exercise, are encouraged to further reduce cardiovascular risk. Previous risk of heart attack and stroke was 28%, necessitating further reduction. - Continue Zetia  for cholesterol management - Schedule fasting lipid panel for Friday - Encourage participation in water  aerobics at the [facility]  Prediabetes A1c is at 6.1%, indicating prediabetes. Lifestyle modifications, including diet and exercise, are recommended to lower A1c levels. - Encourage dietary changes to reduce intake of high-glycemic foods - Promote regular exercise, including starting water  aerobics - Continue Jardiance  Depression Current treatment with Lexapro  5 mg is insufficient, as it does not consistently aid sleep or focus. Symptoms may be exacerbated by recent stressors, including the loss of a pet. Increasing the dose of Lexapro  is considered to improve symptoms. - Increase Lexapro  to 10 mg at night - Monitor for improvement in sleep and focus         Return in about 3 months (around 12/01/2024).    Felesha Moncrieffe K Rosan Calbert, MD

## 2024-09-02 ENCOUNTER — Ambulatory Visit

## 2024-09-03 ENCOUNTER — Telehealth: Payer: Self-pay | Admitting: Family Medicine

## 2024-09-03 ENCOUNTER — Ambulatory Visit

## 2024-09-03 DIAGNOSIS — E782 Mixed hyperlipidemia: Secondary | ICD-10-CM

## 2024-09-03 DIAGNOSIS — I10 Essential (primary) hypertension: Secondary | ICD-10-CM

## 2024-09-03 LAB — LIPID PANEL
Chol/HDL Ratio: 3.4 ratio (ref 0.0–4.4)
Cholesterol, Total: 212 mg/dL — ABNORMAL HIGH (ref 100–199)
HDL: 62 mg/dL (ref >39)
LDL Chol Calc (NIH): 132 mg/dL — ABNORMAL HIGH (ref 0–99)
Triglycerides: 102 mg/dL (ref 0–149)
VLDL Cholesterol Cal: 18 mg/dL (ref 5–40)

## 2024-09-03 LAB — COMPREHENSIVE METABOLIC PANEL WITH GFR
ALT: 12 IU/L (ref 0–32)
AST: 13 IU/L (ref 0–40)
Albumin: 4.3 g/dL (ref 3.9–4.9)
Alkaline Phosphatase: 167 IU/L — ABNORMAL HIGH (ref 49–135)
BUN/Creatinine Ratio: 14 (ref 12–28)
BUN: 28 mg/dL — ABNORMAL HIGH (ref 8–27)
Bilirubin Total: 0.3 mg/dL (ref 0.0–1.2)
CO2: 22 mmol/L (ref 20–29)
Calcium: 10.1 mg/dL (ref 8.7–10.3)
Chloride: 106 mmol/L (ref 96–106)
Creatinine, Ser: 2.03 mg/dL — ABNORMAL HIGH (ref 0.57–1.00)
Globulin, Total: 2.9 g/dL (ref 1.5–4.5)
Glucose: 105 mg/dL — ABNORMAL HIGH (ref 70–99)
Potassium: 4.1 mmol/L (ref 3.5–5.2)
Sodium: 144 mmol/L (ref 134–144)
Total Protein: 7.2 g/dL (ref 6.0–8.5)
eGFR: 26 mL/min/1.73 — ABNORMAL LOW (ref >59)

## 2024-09-03 LAB — CBC WITH DIFFERENTIAL/PLATELET
Basophils Absolute: 0 x10E3/uL (ref 0.0–0.2)
Basos: 0 %
EOS (ABSOLUTE): 0.4 x10E3/uL (ref 0.0–0.4)
Eos: 5 %
Hematocrit: 38.1 % (ref 34.0–46.6)
Hemoglobin: 11.9 g/dL (ref 11.1–15.9)
Immature Grans (Abs): 0 x10E3/uL (ref 0.0–0.1)
Immature Granulocytes: 0 %
Lymphocytes Absolute: 3 x10E3/uL (ref 0.7–3.1)
Lymphs: 33 %
MCH: 27.9 pg (ref 26.6–33.0)
MCHC: 31.2 g/dL — ABNORMAL LOW (ref 31.5–35.7)
MCV: 89 fL (ref 79–97)
Monocytes Absolute: 0.6 x10E3/uL (ref 0.1–0.9)
Monocytes: 6 %
Neutrophils Absolute: 5 x10E3/uL (ref 1.4–7.0)
Neutrophils: 56 %
Platelets: 279 x10E3/uL (ref 150–450)
RBC: 4.27 x10E6/uL (ref 3.77–5.28)
RDW: 15 % (ref 11.7–15.4)
WBC: 9.1 x10E3/uL (ref 3.4–10.8)

## 2024-09-03 NOTE — Telephone Encounter (Signed)
 Patient wants Dr Ziglar to know she is not taking the Atenolol.  Last OV 09/01/2024.

## 2024-09-03 NOTE — Telephone Encounter (Signed)
 Dr. Zigalr advised patient to continue medication.

## 2024-09-08 ENCOUNTER — Telehealth: Payer: Self-pay | Admitting: Family Medicine

## 2024-09-08 NOTE — Telephone Encounter (Signed)
 Spoke with patient regarding her most recent labs.  Her creatinine is 2 and this is a significant change for her.  May be related to starting Jardiance.  She has appointment with nephrology on Tuesday.  Gave her a copy of her labs to show to her nephrologist.

## 2024-09-09 ENCOUNTER — Other Ambulatory Visit: Payer: Self-pay | Admitting: Family Medicine

## 2024-09-09 DIAGNOSIS — E0590 Thyrotoxicosis, unspecified without thyrotoxic crisis or storm: Secondary | ICD-10-CM

## 2024-09-13 ENCOUNTER — Ambulatory Visit: Payer: Self-pay

## 2024-09-13 NOTE — Telephone Encounter (Signed)
 FYI Only or Action Required?: Action required by provider: request for appointment.  Patient was last seen in primary care on 09/01/2024 by Ziglar, Susan K, MD.  Called Nurse Triage reporting No chief complaint on file..  Symptoms began several days ago.  Interventions attempted: OTC medications: Benadryl, Albuterol .  Symptoms are: gradually worsening.  Triage Disposition: See Physician Within 24 Hours  Patient/caregiver understands and will follow disposition?: Yes  Copied from CRM #[id number]. Topic: Clinical - Red Word Triage >> Sep 13, 2024  9:38 AM Winona R wrote: Headache and congestion,  chest and back pain when coughing when she coughs neg covid test Reason for Disposition  SEVERE coughing spells (e.g., whooping sound after coughing, vomiting after coughing)  Answer Assessment - Initial Assessment Questions No available clinic appts today, pt declined alternative clinic. Advised UC today. Pt reports going to UC today.  1. ONSET: When did the cough begin?      Week ago; sinus HA, cough, nasal drainage, back pain(constant, feel more when),  2. SEVERITY: How bad is the cough today?      mild 3. SPUTUM: Describe the color of your sputum (e.g., none, dry cough; clear, white, yellow, green)     None, only clear nasal drainage 4. HEMOPTYSIS: Are you coughing up any blood? If Yes, ask: How much? (e.g., flecks, streaks, tablespoons, etc.)     no 5. DIFFICULTY BREATHING: Are you having difficulty breathing? If Yes, ask: How bad is it? (e.g., mild, moderate, severe)      denies 6. FEVER: Do you have a fever? If Yes, ask: What is your temperature, how was it measured, and when did it start?     Denies fever, chills 7. CARDIAC HISTORY: Do you have any history of heart disease? (e.g., heart attack, congestive heart failure)      none 8. LUNG HISTORY: Do you have any history of lung disease?  (e.g., pulmonary embolus, asthma, emphysema)    No; but used albuterol ,  Benadryl 9. PE RISK FACTORS: Do you have a history of blood clots? (or: recent major surgery, recent prolonged travel, bedridden)     no 10. OTHER SYMPTOMS: Do you have any other symptoms? (e.g., runny nose, wheezing, chest pain)       Not currently wheezing, on Saturday and sore thro  12. TRAVEL: Have you traveled out of the country in the last month? (e.g., travel history, exposures)       no  Protocols used: Cough - Acute Non-Productive-A-AH

## 2024-09-13 NOTE — Telephone Encounter (Signed)
 Patient used home kit for Covid on 09/10/24 and it was negative. Patient states that she is going to UC at [facility].

## 2024-09-21 ENCOUNTER — Ambulatory Visit
Admission: RE | Admit: 2024-09-21 | Discharge: 2024-09-21 | Disposition: A | Discharge status: Home / Self Care | Source: Ambulatory Visit | Attending: Physician Assistant | Admitting: Physician Assistant

## 2024-09-21 DIAGNOSIS — Z780 Asymptomatic menopausal state: Secondary | ICD-10-CM | POA: Diagnosis present

## 2024-10-22 ENCOUNTER — Other Ambulatory Visit: Payer: Self-pay | Admitting: Family Medicine

## 2024-10-22 DIAGNOSIS — E118 Type 2 diabetes mellitus with unspecified complications: Secondary | ICD-10-CM

## 2024-11-08 ENCOUNTER — Other Ambulatory Visit: Payer: Self-pay | Admitting: Family Medicine

## 2024-11-08 DIAGNOSIS — E118 Type 2 diabetes mellitus with unspecified complications: Secondary | ICD-10-CM

## 2024-11-09 ENCOUNTER — Encounter

## 2024-11-15 ENCOUNTER — Telehealth: Payer: Self-pay | Admitting: Family Medicine

## 2024-11-15 ENCOUNTER — Other Ambulatory Visit: Payer: Self-pay | Admitting: Family Medicine

## 2024-11-15 DIAGNOSIS — E118 Type 2 diabetes mellitus with unspecified complications: Secondary | ICD-10-CM

## 2024-11-15 MED ORDER — OLMESARTAN MEDOXOMIL-HCTZ 20-12.5 MG PO TABS: 1.0000 | ORAL_TABLET | Freq: Every morning | ORAL | 1 refills | Status: AC

## 2024-11-15 NOTE — Telephone Encounter (Signed)
 Called patient and advised that I have seen 6 months of olmesartan -HCTZ  for her.

## 2024-11-15 NOTE — Telephone Encounter (Signed)
 Patient walked in saying [facility]' will not approve medication, Olmesartan .  Pharmacy sent a new message a week ago.  It has been a month.  Please call patient [phone].

## 2024-12-23 ENCOUNTER — Ambulatory Visit: Admitting: Family Medicine

## 2024-12-28 ENCOUNTER — Encounter: Payer: Self-pay | Admitting: Family Medicine

## 2024-12-28 ENCOUNTER — Ambulatory Visit: Admitting: Family Medicine

## 2024-12-28 VITALS — BP 122/79 | HR 91 | Temp 98.5°F | Resp 16 | Ht 66.0 in | Wt 185.2 lb

## 2024-12-28 DIAGNOSIS — L309 Dermatitis, unspecified: Secondary | ICD-10-CM | POA: Diagnosis not present

## 2024-12-28 DIAGNOSIS — L299 Pruritus, unspecified: Secondary | ICD-10-CM | POA: Insufficient documentation

## 2024-12-28 DIAGNOSIS — I152 Hypertension secondary to endocrine disorders: Secondary | ICD-10-CM | POA: Diagnosis not present

## 2024-12-28 DIAGNOSIS — E1159 Type 2 diabetes mellitus with other circulatory complications: Secondary | ICD-10-CM | POA: Diagnosis not present

## 2024-12-28 DIAGNOSIS — E782 Mixed hyperlipidemia: Secondary | ICD-10-CM | POA: Diagnosis not present

## 2024-12-28 DIAGNOSIS — F39 Unspecified mood [affective] disorder: Secondary | ICD-10-CM | POA: Diagnosis not present

## 2024-12-28 DIAGNOSIS — E118 Type 2 diabetes mellitus with unspecified complications: Secondary | ICD-10-CM

## 2024-12-28 DIAGNOSIS — F5104 Psychophysiologic insomnia: Secondary | ICD-10-CM | POA: Diagnosis not present

## 2024-12-28 LAB — POCT GLYCOSYLATED HEMOGLOBIN (HGB A1C)
HbA1c POC (<> result, manual entry): 6.1 %
HbA1c, POC (controlled diabetic range): 6.1 % (ref 0.0–7.0)
HbA1c, POC (prediabetic range): 6.1 % (ref 5.7–6.4)
Hemoglobin A1C: 6.1 % — AB (ref 4.0–5.6)

## 2024-12-28 MED ORDER — TRIAMCINOLONE ACETONIDE 0.1 % EX CREA: 1.0000 | TOPICAL_CREAM | Freq: Two times a day (BID) | CUTANEOUS | 3 refills | Status: AC

## 2024-12-28 MED ORDER — CHOLESTYRAMINE 4 G PO PACK: 4.0000 g | PACK | Freq: Three times a day (TID) | ORAL | 12 refills | Status: AC

## 2024-12-28 MED ORDER — HYDROXYZINE PAMOATE 25 MG PO CAPS: 25.0000 mg | ORAL_CAPSULE | Freq: Every day | ORAL | 1 refills | Status: AC

## 2024-12-28 MED ORDER — ESCITALOPRAM OXALATE 20 MG PO TABS: 20.0000 mg | ORAL_TABLET | Freq: Every day | ORAL | 1 refills | Status: AC

## 2024-12-28 NOTE — Assessment & Plan Note (Signed)
 Her back itches and she feels a rash.  Will try triamcinolone  cream 0.1% twice daily.

## 2024-12-28 NOTE — Assessment & Plan Note (Signed)
 A1c today is 6.1.

## 2024-12-28 NOTE — Assessment & Plan Note (Signed)
 Trial hydroxyzine  25 mg 1 or 2 p.o. nightly

## 2024-12-28 NOTE — Assessment & Plan Note (Signed)
 She is on Lexapro  10 mg daily and is still irritable at work.  Will increase to 20 mg.

## 2024-12-28 NOTE — Assessment & Plan Note (Signed)
 She has not tolerated statins.  Zetia  has had minimal effect.  Will try cholestyramine  4 g 3 times a day.

## 2024-12-28 NOTE — Progress Notes (Signed)
 "  Established Patient Office Visit  Subjective   Patient ID: Kendra Rhodes, female    DOB: [birth year]  Age: 69 y.o. MRN: [id number]  Chief Complaint  Patient presents with   Eczema    Pt. C/o of eczema that was very itchy with some discomfort. Pt. Stated it's been going on for a year now. Pt. Stated of having a rash on her shoulders. Pt. Pain is at a 1.    HPI Discussed the use of AI scribe software for clinical note transcription with the patient, who gave verbal consent to proceed.  History of Present Illness   CYANA SHOOK is a 69 year old female who presents with a rash on her back.  She has a rash on her back that appears as a line and scales over. It is not painful, and she is often unaware of its presence until she touches it. The rash is itchy, especially in the middle of her back, and she has not been applying any treatment recently. She recalls using a cream that she got from a Dermatologist prescribed years ago but cannot remember the details.  She reports eczema in the middle of her back with significant itching. She is not currently using any medication for her eczema.  She is taking atenolol 25 mg and Benicar  20mg  and hydrochlorothiazide  12.5mg  daily for blood pressure management.  Her cholesterol levels were discussed, with a total cholesterol of 212 mg/dL, triglycerides at 897 mg/dL, HDL at 62 mg/dL, and LDL at 867 mg/dL.  She is a borderline diabetic and today her A1c is 6.1%.  Would like to get her LDL at 70 or below.  She does not tolerate statins well due to muscle issues.  She is currently taking Lexapro  10mg  for mood, but she feels it is not sufficient and wants a stronger dose due to stress at work and plans to retire in March.  She reports difficulty sleeping and has run out of hydroxyzine  25 mg, which she previously used at bedtime to aid sleep.  Her son, aged 10, experienced a severe respiratory illness on Christmas day, requiring hospitalization for viral  pneumonia and fluid around his heart, which led to kidney issues. He is now recovering but has been advised to lose weight and manage his health better.        Objective:     BP 122/79 (Cuff Size: Normal)   Pulse 91   Temp 98.5 F (36.9 C) (Oral)   Resp 16   Ht 5' 6 (1.676 m)   Wt 185 lb 3.2 oz (84 kg)   SpO2 96%   BMI 29.89 kg/m    Physical Exam Vitals and nursing note reviewed.  Constitutional:      Appearance: Normal appearance.  HENT:     Head: Normocephalic and atraumatic.  Eyes:     Conjunctiva/sclera: Conjunctivae normal.  Cardiovascular:     Rate and Rhythm: Normal rate and regular rhythm.  Pulmonary:     Effort: Pulmonary effort is normal.     Breath sounds: Normal breath sounds.  Musculoskeletal:     Right lower leg: No edema.     Left lower leg: No edema.  Skin:    General: Skin is warm and dry.  Neurological:     Mental Status: She is alert and oriented to person, place, and time.  Psychiatric:        Mood and Affect: Mood normal.        Behavior: Behavior normal.  Thought Content: Thought content normal.        Judgment: Judgment normal.          Results for orders placed or performed in visit on 12/28/24  POCT HgB A1C  Result Value Ref Range   Hemoglobin A1C 6.1 (A) 4.0 - 5.6 %   HbA1c POC (<> result, manual entry) 6.1 4.0 - 5.6 %   HbA1c, POC (prediabetic range) 6.1 5.7 - 6.4 %   HbA1c, POC (controlled diabetic range) 6.1 0.0 - 7.0 %      The 10-year ASCVD risk score (Arnett DK, et al., 2019) is: 21.5%    Assessment & Plan:  Type 2 diabetes mellitus with complications (HCC) Assessment & Plan: A1c today is 6.1%.  Orders: -     POCT glycosylated hemoglobin (Hb A1C)  Eczema, unspecified type -     Triamcinolone  Acetonide; Apply 1 Application topically 2 (two) times daily.  Dispense: 90 g; Refill: 3  Mixed hyperlipidemia Assessment & Plan: She has not tolerated statins.  Zetia  has had minimal effect.  Will try  cholestyramine  4 g 3 times a day.  Orders: -     Cholestyramine ; Take 1 packet (4 g total) by mouth 3 (three) times daily with meals.  Dispense: 60 each; Refill: 12  Itchy skin Assessment & Plan: Her back itches and she feels a rash.  Will try triamcinolone  cream 0.1% twice daily.   Mood disorder Assessment & Plan: She is on Lexapro  10 mg daily and is still irritable at work.  Will increase to 20 mg.  Orders: -     Escitalopram  Oxalate; Take 1 tablet (20 mg total) by mouth daily.  Dispense: 90 tablet; Refill: 1  Hypertension associated with diabetes (HCC)  Chronic insomnia Assessment & Plan: Trial hydroxyzine  25 mg 1 or 2 p.o. nightly  Orders: -     hydrOXYzine  Pamoate; Take 1 capsule (25 mg total) by mouth at bedtime.  Dispense: 90 capsule; Refill: 1     No follow-ups on file.    Amro Winebarger K Quierra Silverio, MD "

## 2025-01-05 ENCOUNTER — Telehealth: Payer: Self-pay

## 2025-01-05 NOTE — Telephone Encounter (Signed)
 Copied from CRM #[id number]. Topic: Clinical - Prescription Issue >> Jan 05, 2025  2:39 PM Pinkey ORN wrote: Reason for CRM: Medication Issue >> Jan 05, 2025  2:44 PM Pinkey ORN wrote: Patient called in, states the insurance denied the hydrOXYzine  (VISTARIL ) 25 MG capsule that was called into the pharmacy. Patient states no further information was given as far as why it was denied, please follow up with the pharmacy and patient on behalf of this matter.     Sent Mychart asking patient to call insurance company to ask why this was not covered and see what med they will cover as we do not call the patient must call.
